# Patient Record
Sex: Male | Born: 1975 | Race: Black or African American | Hispanic: No | Marital: Married | Smoking: Former smoker
Health system: Western US, Academic
[De-identification: ages and names within clinical notes are randomized; demographics above are authoritative.]

## PROBLEM LIST (undated history)

## (undated) DIAGNOSIS — S82892A Other fracture of left lower leg, initial encounter for closed fracture: Secondary | ICD-10-CM

## (undated) DIAGNOSIS — S92901A Unspecified fracture of right foot, initial encounter for closed fracture: Secondary | ICD-10-CM

## (undated) DIAGNOSIS — IMO0001 Reserved for inherently not codable concepts without codable children: Secondary | ICD-10-CM

## (undated) DIAGNOSIS — F10231 Alcohol dependence with withdrawal delirium: Secondary | ICD-10-CM

## (undated) DIAGNOSIS — A0472 Enterocolitis due to Clostridium difficile, not specified as recurrent: Secondary | ICD-10-CM

## (undated) DIAGNOSIS — F1020 Alcohol dependence, uncomplicated: Secondary | ICD-10-CM

## (undated) DIAGNOSIS — F10931 Alcohol use, unspecified with withdrawal delirium (CMS-HCC): Secondary | ICD-10-CM

## (undated) MED ORDER — INFLUENZA VAC SPLIT QUAD 0.5 ML IM SUSY
0.50 mL | PREFILLED_SYRINGE | INTRAMUSCULAR | Status: AC
Start: 2016-07-06 — End: ?

---
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
## 2013-10-13 ENCOUNTER — Emergency Department (HOSPITAL_COMMUNITY)
Admission: EM | Admit: 2013-10-13 | Discharge: 2013-10-13 | Disposition: A | Discharge status: Home / Self Care | Payer: Medicaid Other | Attending: Emergency Medicine | Admitting: Emergency Medicine

## 2013-10-13 ENCOUNTER — Emergency Department (HOSPITAL_COMMUNITY): Payer: Medicaid Other

## 2013-10-13 ENCOUNTER — Encounter (HOSPITAL_COMMUNITY): Payer: Self-pay | Admitting: Emergency Medicine

## 2013-10-13 DIAGNOSIS — F17200 Nicotine dependence, unspecified, uncomplicated: Secondary | ICD-10-CM | POA: Insufficient documentation

## 2013-10-13 DIAGNOSIS — X500XXA Overexertion from strenuous movement or load, initial encounter: Secondary | ICD-10-CM | POA: Insufficient documentation

## 2013-10-13 DIAGNOSIS — Y929 Unspecified place or not applicable: Secondary | ICD-10-CM | POA: Insufficient documentation

## 2013-10-13 DIAGNOSIS — S9306XA Dislocation of unspecified ankle joint, initial encounter: Secondary | ICD-10-CM | POA: Insufficient documentation

## 2013-10-13 DIAGNOSIS — IMO0002 Reserved for concepts with insufficient information to code with codable children: Secondary | ICD-10-CM

## 2013-10-13 DIAGNOSIS — Y9301 Activity, walking, marching and hiking: Secondary | ICD-10-CM | POA: Insufficient documentation

## 2013-10-13 HISTORY — DX: Other fracture of left lower leg, initial encounter for closed fracture: S82.892A

## 2013-10-13 HISTORY — DX: Unspecified fracture of right foot, initial encounter for closed fracture: S92.901A

## 2013-10-13 MED ORDER — NAPROXEN 500 MG PO TABS: 500.0000 mg | ORAL_TABLET | Freq: Two times a day (BID) | ORAL | Status: DC

## 2013-10-13 NOTE — Discharge Instructions (Signed)
Ankle Fracture A fracture is a break in the bone. A cast or splint is used to protect and keep your injured bone from moving.  HOME CARE INSTRUCTIONS   Use your crutches as directed.  To lessen the swelling, keep the injured leg elevated while sitting or lying down.  Apply ice to the injury for 15-20 minutes, 03-04 times per day while awake for 2 days. Put the ice in a plastic bag and place a thin towel between the bag of ice and your cast.  If you have a plaster or fiberglass cast:  Do not try to scratch the skin under the cast using sharp or pointed objects.  Check the skin around the cast every day. You may put lotion on any red or sore areas.  Keep your cast dry and clean.  If you have a plaster splint:  Wear the splint as directed.  You may loosen the elastic around the splint if your toes become numb, tingle, or turn cold or blue.  Do not put pressure on any part of your cast or splint; it may break. Rest your cast only on a pillow the first 24 hours until it is fully hardened.  Your cast or splint can be protected during bathing with a plastic bag. Do not lower the cast or splint into water.  Take medications as directed by your caregiver. Only take over-the-counter or prescription medicines for pain, discomfort, or fever as directed by your caregiver.  Do not drive a vehicle until your caregiver specifically tells you it is safe to do so.  If your caregiver has given you a follow-up appointment, it is very important to keep that appointment. Not keeping the appointment could result in a chronic or permanent injury, pain, and disability. If there is any problem keeping the appointment, you must call back to this facility for assistance. SEEK IMMEDIATE MEDICAL CARE IF:   Your cast gets damaged or breaks.  You have continued severe pain or more swelling than you did before the cast was put on.  Your skin or toenails below the injury turn blue or gray, or feel cold or  numb.  There is a bad smell or new stains and/or purulent (pus like) drainage coming from under the cast. If you do not have a window in your cast for observing the wound, a discharge or minor bleeding may show up as a stain on the outside of your cast. Report these findings to your caregiver. MAKE SURE YOU:   Understand these instructions.  Will watch your condition.  Will get help right away if you are not doing well or get worse. Document Released: 07/20/2000 Document Revised: 10/15/2011 Document Reviewed: 02/19/2013 [facility] Patient Information 2014 [place] [place].  Naproxen and naproxen sodium oral immediate-release tablets What is this medicine? NAPROXEN (na PROX en) is a non-steroidal anti-inflammatory drug (NSAID). It is used to reduce swelling and to treat pain. This medicine may be used for dental pain, headache, or painful monthly periods. It is also used for painful joint and muscular problems such as arthritis, tendinitis, bursitis, and gout. This medicine may be used for other purposes; ask your health care provider or pharmacist if you have questions. COMMON BRAND NAME(S): Aflaxen, Aleve Arthritis, Aleve, All Day Relief, Anaprox DS, Anaprox, Naprosyn What should I tell my health care provider before I take this medicine? They need to know if you have any of these conditions: -asthma -cigarette smoker -drink more than 3 alcohol containing drinks a day -heart  disease or circulation problems such as heart failure or leg edema (fluid retention) -high blood pressure -kidney disease -liver disease -stomach bleeding or ulcers -an unusual or allergic reaction to naproxen, aspirin, other NSAIDs, other medicines, foods, dyes, or preservatives -pregnant or trying to get pregnant -breast-feeding How should I use this medicine? Take this medicine by mouth with a glass of water. Follow the directions on the prescription label. Take it with food if your stomach gets upset. Try to  not lie down for at least 10 minutes after you take it. Take your medicine at regular intervals. Do not take your medicine more often than directed. Long-term, continuous use may increase the risk of heart attack or stroke. A special MedGuide will be given to you by the pharmacist with each prescription and refill. Be sure to read this information carefully each time. Talk to your pediatrician regarding the use of this medicine in children. Special care may be needed. Overdosage: If you think you have taken too much of this medicine contact a poison control center or emergency room at once. NOTE: This medicine is only for you. Do not share this medicine with others. What if I miss a dose? If you miss a dose, take it as soon as you can. If it is almost time for your next dose, take only that dose. Do not take double or extra doses. What may interact with this medicine? -alcohol -aspirin -cidofovir -diuretics -lithium -methotrexate -other drugs for inflammation like ketorolac or prednisone -pemetrexed -probenecid -warfarin This list may not describe all possible interactions. Give your health care provider a list of all the medicines, herbs, non-prescription drugs, or dietary supplements you use. Also tell them if you smoke, drink alcohol, or use illegal drugs. Some items may interact with your medicine. What should I watch for while using this medicine? Tell your doctor or health care professional if your pain does not get better. Talk to your doctor before taking another medicine for pain. Do not treat yourself. This medicine does not prevent heart attack or stroke. In fact, this medicine may increase the chance of a heart attack or stroke. The chance may increase with longer use of this medicine and in people who have heart disease. If you take aspirin to prevent heart attack or stroke, talk with your doctor or health care professional. Do not take other medicines that contain aspirin,  ibuprofen, or naproxen with this medicine. Side effects such as stomach upset, nausea, or ulcers may be more likely to occur. Many medicines available without a prescription should not be taken with this medicine. This medicine can cause ulcers and bleeding in the stomach and intestines at any time during treatment. Do not smoke cigarettes or drink alcohol. These increase irritation to your stomach and can make it more susceptible to damage from this medicine. Ulcers and bleeding can happen without warning symptoms and can cause death. You may get drowsy or dizzy. Do not drive, use machinery, or do anything that needs mental alertness until you know how this medicine affects you. Do not stand or sit up quickly, especially if you are an older patient. This reduces the risk of dizzy or fainting spells. This medicine can cause you to bleed more easily. Try to avoid damage to your teeth and gums when you brush or floss your teeth. What side effects may I notice from receiving this medicine? Side effects that you should report to your doctor or health care professional as soon as possible: -black or  bloody stools, blood in the urine or vomit -blurred vision -chest pain -difficulty breathing or wheezing -nausea or vomiting -severe stomach pain -skin rash, skin redness, blistering or peeling skin, hives, or itching -slurred speech or weakness on one side of the body -swelling of eyelids, throat, lips -unexplained weight gain or swelling -unusually weak or tired -yellowing of eyes or skin Side effects that usually do not require medical attention (report to your doctor or health care professional if they continue or are bothersome): -constipation -headache -heartburn This list may not describe all possible side effects. Call your doctor for medical advice about side effects. You may report side effects to FDA at 1-800-FDA-1088. Where should I keep my medicine? Keep out of the reach of  children. Store at room temperature between 15 and 30 degrees C (59 and 86 degrees F). Keep container tightly closed. Throw away any unused medicine after the expiration date. NOTE: This sheet is a summary. It may not cover all possible information. If you have questions about this medicine, talk to your doctor, pharmacist, or health care provider.  2014, Elsevier/Gold Standard. (2009-07-25 20:10:16)

## 2013-10-13 NOTE — ED Notes (Signed)
Patient states he was walking and felt a "pop in the top of my left foot".   Patient states 8/10 pain since.

## 2013-10-13 NOTE — ED Provider Notes (Signed)
Medical screening examination/treatment/procedure(s) were performed by non-physician practitioner and as supervising physician I was immediately available for consultation/collaboration.  Flint MelterElliott L Jaymarie Yeakel, MD 10/13/13 [phone]

## 2013-10-13 NOTE — ED Provider Notes (Signed)
CSN: [id number]     Arrival date & time 10/13/13  0816 History  This chart was scribed for non-physician practitioner, Arthor CaptainAbigail Miyana Mordecai, PA-C working with Flint MelterElliott L Wentz, MD by Greggory StallionKayla Andersen, ED scribe. This patient was seen in room TR08C/TR08C and the patient's care was started at 9:37 AM.   Chief Complaint  Patient presents with  . Foot Pain   The history is provided by the patient. No language interpreter was used.   HPI Comments: Daniel Santiago is a 38 y.o. male who presents to the Emergency Department complaining of sudden onset left foot pain with associated swelling that started one week ago. Pt states he was walking, got a cramp in his foot and felt a pop on the top of it. Bearing weight worsens the pain. Denies numbness or tingling in toes. Pt smokes cigarettes daily.   Past Medical History  Diagnosis Date  . Foot fracture, right   . Ankle fracture, left    History reviewed. No pertinent past surgical history. No family history on file. History  Substance Use Topics  . Smoking status: Current Every Day Smoker  . Smokeless tobacco: Not on file  . Alcohol Use: Yes    Review of Systems  Constitutional: Negative for fever.  HENT: Negative for congestion.   Eyes: Negative for redness.  Respiratory: Negative for shortness of breath.   Cardiovascular: Negative for chest pain.  Gastrointestinal: Negative for abdominal distention.  Musculoskeletal: Positive for arthralgias and joint swelling.  Skin: Negative for rash.  Neurological: Negative for speech difficulty.  Psychiatric/Behavioral: Negative for confusion.   Allergies  Review of patient's allergies indicates no known allergies.  Home Medications  No current outpatient prescriptions on file.  BP 134/84  Pulse 88  Temp(Src) 98.2 F (36.8 C) (Oral)  Resp 18  SpO2 100%  Physical Exam  Nursing note and vitals reviewed. Constitutional: He is oriented to person, place, and time. He appears well-developed and  well-nourished. No distress.  HENT:  Head: Normocephalic and atraumatic.  Eyes: EOM are normal.  Neck: Neck supple. No tracheal deviation present.  Cardiovascular: Normal rate.   Pulmonary/Chest: Effort normal. No respiratory distress.  Musculoskeletal: Normal range of motion.  Swelling and discoloration over the dorsal surface of the metatarsal region. Tender to palpation. Pulses intact.   Neurological: He is alert and oriented to person, place, and time.  Skin: Skin is warm and dry.  Psychiatric: He has a normal mood and affect. His behavior is normal.    ED Course  Procedures (including critical care time)  DIAGNOSTIC STUDIES: Oxygen Saturation is 100% on RA, normal by my interpretation.    COORDINATION OF CARE: 9:42 AM-Discussed treatment plan which includes ankle splint, crutches and pain medication with pt at bedside and pt agreed to plan. Advised pt to follow up with an orthopedist or podiatrist.   Labs Review Labs Reviewed - No data to display Imaging Review Dg Foot Complete Left  10/13/2013   CLINICAL DATA:  Proximal metatarsal pain without history of injury  EXAM: LEFT FOOT - COMPLETE 3+ VIEW  COMPARISON:  None.  FINDINGS: Three views of the left foot reveal the bones to be adequately mineralized. There is no evidence of an acute fracture nor dislocation. The interphalangeal joints, the metatarsophalangeal joints, and the tarsometatarsal joints appear normal. The bones of the hindfoot appear intact. The overlying soft tissues are normal in appearance.  IMPRESSION: There is no acute bony abnormality of the left foot nor evidence of significant degenerative change. An  accessory ossicle or old avulsion from the tip of the medial malleolus is demonstrated.   Electronically Signed   By: David  [place]   On: 10/13/2013 09:07     EKG Interpretation None      MDM   Final diagnoses:  Avulsion of right ankle    Patient came in with pain in the medial malleolus after severe  cramps and feeling a pop.  I do feel that although the x-ray is read as no acute injury this did happen in about a week and half ago and I feel that he does have an avulsion fracture of the  medial malleolus secondary to muscle strain.  Integument Cam Walker boot, light duty at work, crutches, followup with orthopedics or podiatry.  I personally performed the services described in this documentation, which was scribed in my presence. The recorded information has been reviewed and is accurate.   Arthor Captain, PA-C 10/13/13 1034

## 2014-02-25 ENCOUNTER — Ambulatory Visit: Payer: Self-pay | Admitting: Podiatry

## 2014-05-17 ENCOUNTER — Encounter (HOSPITAL_COMMUNITY): Payer: Self-pay | Admitting: Emergency Medicine

## 2014-05-17 ENCOUNTER — Emergency Department (HOSPITAL_COMMUNITY)
Admission: EM | Admit: 2014-05-17 | Discharge: 2014-05-17 | Disposition: A | Discharge status: Home / Self Care | Payer: Medicaid Other | Attending: Emergency Medicine | Admitting: Emergency Medicine

## 2014-05-17 DIAGNOSIS — Z791 Long term (current) use of non-steroidal anti-inflammatories (NSAID): Secondary | ICD-10-CM | POA: Diagnosis not present

## 2014-05-17 DIAGNOSIS — M79662 Pain in left lower leg: Secondary | ICD-10-CM | POA: Insufficient documentation

## 2014-05-17 DIAGNOSIS — Z720 Tobacco use: Secondary | ICD-10-CM | POA: Insufficient documentation

## 2014-05-17 DIAGNOSIS — M79661 Pain in right lower leg: Secondary | ICD-10-CM | POA: Diagnosis present

## 2014-05-17 DIAGNOSIS — Z8781 Personal history of (healed) traumatic fracture: Secondary | ICD-10-CM | POA: Diagnosis not present

## 2014-05-17 DIAGNOSIS — M79605 Pain in left leg: Secondary | ICD-10-CM

## 2014-05-17 DIAGNOSIS — M79604 Pain in right leg: Secondary | ICD-10-CM

## 2014-05-17 LAB — BASIC METABOLIC PANEL
Anion gap: 12 (ref 5–15)
BUN: 10 mg/dL (ref 6–23)
CHLORIDE: 103 meq/L (ref 96–112)
CO2: 28 mEq/L (ref 19–32)
Calcium: 9.5 mg/dL (ref 8.4–10.5)
Creatinine, Ser: 0.91 mg/dL (ref 0.50–1.35)
GFR calc Af Amer: >90 mL/min (ref >90)
GFR calc non Af Amer: >90 mL/min (ref >90)
Glucose, Bld: 121 mg/dL — ABNORMAL HIGH (ref 70–99)
POTASSIUM: 4.1 meq/L (ref 3.7–5.3)
SODIUM: 143 meq/L (ref 137–147)

## 2014-05-17 MED ORDER — OXYCODONE-ACETAMINOPHEN 5-325 MG PO TABS: 1.0000 | ORAL_TABLET | ORAL | Status: AC | PRN

## 2014-05-17 MED ORDER — OXYCODONE-ACETAMINOPHEN 5-325 MG PO TABS
1.0000 | ORAL_TABLET | Freq: Once | ORAL | Status: AC
  Administered 2014-05-17: 1 via ORAL
  Filled 2014-05-17: qty 1

## 2014-05-17 NOTE — ED Provider Notes (Signed)
Medical screening examination/treatment/procedure(s) were performed by non-physician practitioner and as supervising physician I was immediately available for consultation/collaboration.   EKG Interpretation None        Dymon Summerhill, MD 05/17/14 1958 

## 2014-05-17 NOTE — ED Notes (Signed)
Pt c/o bilateral foot and leg pain; pt sts x 2 years with calluses to bottom of feet from walking; pt sts some swelling none noted

## 2014-05-17 NOTE — Discharge Instructions (Signed)
°Emergency Department Resource Guide °1) Find a Doctor and Pay Out of Pocket °Although you won't have to find out who is covered by your insurance plan, it is a good idea to ask around and get recommendations. You will then need to call the office and see if the doctor you have chosen will accept you as a new patient and what types of options they offer for patients who are self-pay. Some doctors offer discounts or will set up payment plans for their patients who do not have insurance, but you will need to ask so you aren't surprised when you get to your appointment. ° °2) Contact Your Local Health Department °Not all health departments have doctors that can see patients for sick visits, but many do, so it is worth a call to see if yours does. If you don't know where your local health department is, you can check in your phone book. The CDC also has a tool to help you locate your state's health department, and many state websites also have listings of all of their local health departments. ° °3) Find a Walk-in Clinic °If your illness is not likely to be very severe or complicated, you may want to try a walk in clinic. These are popping up all over the country in pharmacies, drugstores, and shopping centers. They're usually staffed by nurse practitioners or physician assistants that have been trained to treat common illnesses and complaints. They're usually fairly quick and inexpensive. However, if you have serious medical issues or chronic medical problems, these are probably not your best option. ° °No Primary Care Doctor: °- Call [facility] at  832-8000 - they can help you locate a primary care doctor that  accepts your insurance, provides certain services, etc. °- Physician Referral Service- 1-800-533-3463 ° °Chronic Pain Problems: °Organization         Address  Phone   Notes  °[facility]  [phone] Patients need to be referred by their primary care doctor.  ° °Medication  Assistance: °Organization         Address  Phone   Notes  °[place] [facility] [address] °[place], [place] [address] [phone]) [phone] --Must be a resident of [place] °-- Must have NO insurance coverage whatsoever (no Medicaid/ Medicare, etc.) °-- The pt. MUST have a primary care doctor that directs their care regularly and follows them in the community °  °[facility]  (866) 331-1348   °[employer]  (888) 892-1162   ° °Agencies that provide inexpensive medical care: °Organization         Address  Phone   Notes  °[facility]  ([phone]) [phone]   °[facility]    ([phone]) [phone]   °[facility] [address] °[place], [place] [address] [phone]) [phone]   °[facility] [address], °[place] ([phone]) [phone]   °[facility]    ([phone]) [phone]   °[facility]    ([phone]) [phone]   °[facility] ° [address], [place] Phone:  (336) [phone], Fax:  ([phone] Hours of Operation:  9 am - 6 pm, M-F.  Also accepts Medicaid/Medicare and self-pay.  °[facility] ° [address], [place] Phone: ([phone]) [phone], Fax: ([phone]) [phone]. Hours of Operation:  8:30 am - 5:30 pm, M-F.  Also accepts Medicaid and self-pay.  °[facility] [place] [address], [place] Phone: ([phone]) [phone]   °[facility] [address], [place], [place] (336)[phone], Ext. 123 Mondays & Thursdays: 7-9 AM.  First 15 patients are seen on a first come, first serve basis. °  ° °Medicaid-accepting [place] Providers: ° °Organization         Address  Phone   Notes  °[facility] [address], [place] ([phone] Also accepts self-pay patients.  °[facility] [address], [place] ° ([phone]) [phone]   °[facility] [address], [place]  ([phone]) [phone]   °[facility] [address] [place] [address], [place] ([phone]) [phone]   °[facility] [address], [place]  ° ([phone]) [phone] Only accepts [place] Access Medicaid patients after they have their name applied to their card.  ° °Self-Pay (no insurance) in [place]: ° °Organization         Address  Phone   Notes  °Sickle Cell Patients, [facility] [address], [place] ([phone]) [phone]   °[facility] [address], [place] ([phone]) [phone]   °[facility] ° [address], [place] ([phone]) [phone]   °[facility]/Dr. Osei-Bonsu ° [address] [place] [address], [place] or [address], [place] [phone] Phone number for both [place] and [place] locations is the same.  °[facility] [address], [place] ([phone]   °[facility] [address] [place] [address], [place] or [address] ([phone]) [phone] °([phone]) [phone]   °[facility] [address], [place] ([phone]) [phone], phone; ([phone]) [phone], fax Sees patients 1st and 3rd Saturday of every month.  Must not qualify for public or private insurance (i.e. Medicaid, Medicare, [place] Health Choice, Veterans' Benefits) • Household income should be no more than 200% of the poverty level •The clinic cannot treat you if you are pregnant or think you are pregnant • Sexually transmitted diseases are not treated at the clinic.  ° °

## 2014-05-17 NOTE — ED Provider Notes (Signed)
CSN: [id number]     Arrival date & time 05/17/14  [id number] History   First MD Initiated Contact with Patient 05/17/14 0935     Chief Complaint  Patient presents with  . Foot Pain  . Leg Pain     (Consider location/radiation/quality/duration/timing/severity/associated sxs/prior Treatment) Patient is a 38 y.o. male presenting with lower extremity pain and leg pain. The history is provided by the patient. No language interpreter was used.  Foot Pain This is a chronic problem. The current episode started more than 1 month ago. Pertinent negatives include no chest pain, chills, fever, nausea, numbness or vomiting. Associated symptoms comments: Bilateral foot and lower leg pain/discomfort for the past several months. He reports frequent muscle cramping in calves and hands - no cramping today. No fever, color change or wound/blister/lesion. He states there is intermittent leg swelling. The discomfort today is worsening of chronic pain and, other than the intensity, does not include new symptoms. .  Leg Pain Associated symptoms: no fever     Past Medical History  Diagnosis Date  . Foot fracture, right   . Ankle fracture, left    History reviewed. No pertinent past surgical history. History reviewed. No pertinent family history. History  Substance Use Topics  . Smoking status: Current Every Day Smoker  . Smokeless tobacco: Not on file  . Alcohol Use: Yes    Review of Systems  Constitutional: Negative for fever and chills.  Respiratory: Negative.  Negative for shortness of breath.   Cardiovascular: Negative.  Negative for chest pain.  Gastrointestinal: Negative.  Negative for nausea and vomiting.  Musculoskeletal:       See HPI  Skin: Negative.  Negative for color change and wound.  Neurological: Negative.  Negative for numbness.      Allergies  Review of patient's allergies indicates no known allergies.  Home Medications   Prior to Admission medications   Medication Sig Start  Date End Date Taking? Authorizing Provider  naproxen (NAPROSYN) 500 MG tablet Take 1 tablet (500 mg total) by mouth 2 (two) times daily with a meal. 10/13/13   Abigail Harris, PA-C   BP 136/92  Pulse 62  Temp(Src) 98.2 F (36.8 C) (Oral)  Resp 20  SpO2 97% Physical Exam  Constitutional: He is oriented to person, place, and time. He appears well-developed and well-nourished.  Neck: Normal range of motion.  Pulmonary/Chest: Effort normal.  Musculoskeletal: Normal range of motion.  Lower extremities unremarkable in appearance. No redness or swelling. Feet are generally tender. Calluses present bilateral plantar surfaces. FROM. No calf tenderness. Achilles intact but bilaterally tender.   Neurological: He is alert and oriented to person, place, and time.  Skin: Skin is warm and dry.  Psychiatric: He has a normal mood and affect.    ED Course  Procedures (including critical care time) Labs Review Labs Reviewed  BASIC METABOLIC PANEL   Results for orders placed during the hospital encounter of 05/17/14  BASIC METABOLIC PANEL      Result Value Ref Range   Sodium 143  137 - 147 mEq/L   Potassium 4.1  3.7 - 5.3 mEq/L   Chloride 103  96 - 112 mEq/L   CO2 28  19 - 32 mEq/L   Glucose, Bld 121 (*) 70 - 99 mg/dL   BUN 10  6 - 23 mg/dL   Creatinine, Ser 1.190.91  0.50 - 1.35 mg/dL   Calcium 9.5  8.4 - 14.710.5 mg/dL   GFR calc non Af Amer >90  >  90 mL/min   GFR calc Af Amer >90  >90 mL/min   Anion gap 12  5 - 15    Imaging Review No results found.   EKG Interpretation None      MDM   Final diagnoses:  None    1. Lower extremity pain  No evidence infection, swelling, other acute process. Doubt DVT or vascular compromise given no calf tenderness, swelling or redness, good distal pulses. Will treat chronic pain and refer to PCP.  Arnoldo HookerShari A Jerek Meulemans, PA-C 05/17/14 1448

## 2014-06-01 ENCOUNTER — Ambulatory Visit: Payer: Medicaid Other | Attending: Internal Medicine | Admitting: Internal Medicine

## 2014-06-01 ENCOUNTER — Encounter: Payer: Self-pay | Admitting: Internal Medicine

## 2014-06-01 VITALS — BP 130/90 | HR 76 | Temp 98.0°F | Resp 16 | Wt 168.4 lb

## 2014-06-01 DIAGNOSIS — L84 Corns and callosities: Secondary | ICD-10-CM | POA: Insufficient documentation

## 2014-06-01 DIAGNOSIS — Z0000 Encounter for general adult medical examination without abnormal findings: Secondary | ICD-10-CM | POA: Insufficient documentation

## 2014-06-01 DIAGNOSIS — M79671 Pain in right foot: Secondary | ICD-10-CM | POA: Insufficient documentation

## 2014-06-01 DIAGNOSIS — F17200 Nicotine dependence, unspecified, uncomplicated: Secondary | ICD-10-CM | POA: Insufficient documentation

## 2014-06-01 DIAGNOSIS — R030 Elevated blood-pressure reading, without diagnosis of hypertension: Secondary | ICD-10-CM | POA: Insufficient documentation

## 2014-06-01 DIAGNOSIS — M79606 Pain in leg, unspecified: Secondary | ICD-10-CM | POA: Diagnosis not present

## 2014-06-01 DIAGNOSIS — Z720 Tobacco use: Secondary | ICD-10-CM | POA: Diagnosis not present

## 2014-06-01 DIAGNOSIS — Z23 Encounter for immunization: Secondary | ICD-10-CM | POA: Insufficient documentation

## 2014-06-01 DIAGNOSIS — M79672 Pain in left foot: Secondary | ICD-10-CM

## 2014-06-01 DIAGNOSIS — IMO0001 Reserved for inherently not codable concepts without codable children: Secondary | ICD-10-CM | POA: Insufficient documentation

## 2014-06-01 DIAGNOSIS — M79609 Pain in unspecified limb: Secondary | ICD-10-CM | POA: Insufficient documentation

## 2014-06-01 LAB — CBC WITH DIFFERENTIAL/PLATELET
Basophils Absolute: 0 10*3/uL (ref 0.0–0.1)
Basophils Relative: 0 % (ref 0–1)
EOS ABS: 0 10*3/uL (ref 0.0–0.7)
Eosinophils Relative: 0 % (ref 0–5)
HEMATOCRIT: 46.7 % (ref 39.0–52.0)
HEMOGLOBIN: 16.5 g/dL (ref 13.0–17.0)
LYMPHS PCT: 10 % — AB (ref 12–46)
Lymphs Abs: 1.1 10*3/uL (ref 0.7–4.0)
MCH: 34 pg (ref 26.0–34.0)
MCHC: 35.3 g/dL (ref 30.0–36.0)
MCV: 96.1 fL (ref 78.0–100.0)
Monocytes Absolute: 0.6 10*3/uL (ref 0.1–1.0)
Monocytes Relative: 5 % (ref 3–12)
NEUTROS ABS: 9.5 10*3/uL — AB (ref 1.7–7.7)
Neutrophils Relative %: 85 % — ABNORMAL HIGH (ref 43–77)
Platelets: 369 10*3/uL (ref 150–400)
RBC: 4.86 MIL/uL (ref 4.22–5.81)
RDW: 13.6 % (ref 11.5–15.5)
WBC: 11.2 10*3/uL — ABNORMAL HIGH (ref 4.0–10.5)

## 2014-06-01 LAB — COMPLETE METABOLIC PANEL WITH GFR
ALBUMIN: 4.3 g/dL (ref 3.5–5.2)
ALT: 18 U/L (ref 0–53)
AST: 22 U/L (ref 0–37)
Alkaline Phosphatase: 79 U/L (ref 39–117)
BUN: 9 mg/dL (ref 6–23)
CALCIUM: 10 mg/dL (ref 8.4–10.5)
CO2: 28 mEq/L (ref 19–32)
Chloride: 100 mEq/L (ref 96–112)
Creat: 0.83 mg/dL (ref 0.50–1.35)
GFR, Est African American: >89 mL/min
GLUCOSE: 95 mg/dL (ref 70–99)
Potassium: 4.8 mEq/L (ref 3.5–5.3)
SODIUM: 139 meq/L (ref 135–145)
Total Bilirubin: 0.9 mg/dL (ref 0.2–1.2)
Total Protein: 7.3 g/dL (ref 6.0–8.3)

## 2014-06-01 LAB — LIPID PANEL
CHOLESTEROL: 196 mg/dL (ref 0–200)
HDL: 125 mg/dL (ref >39)
LDL Cholesterol: 60 mg/dL (ref 0–99)
Total CHOL/HDL Ratio: 1.6 Ratio
Triglycerides: 56 mg/dL (ref <150)
VLDL: 11 mg/dL (ref 0–40)

## 2014-06-01 LAB — POCT GLYCOSYLATED HEMOGLOBIN (HGB A1C): HEMOGLOBIN A1C: 5.3

## 2014-06-01 LAB — GLUCOSE, POCT (MANUAL RESULT ENTRY): POC Glucose: 106 mg/dl — AB (ref 70–99)

## 2014-06-01 LAB — TSH: TSH: 0.507 u[IU]/mL (ref 0.350–4.500)

## 2014-06-01 MED ORDER — GABAPENTIN 300 MG PO CAPS: 300.0000 mg | ORAL_CAPSULE | Freq: Three times a day (TID) | ORAL | Status: AC

## 2014-06-01 MED ORDER — NAPROXEN 500 MG PO TABS: 500.0000 mg | ORAL_TABLET | Freq: Two times a day (BID) | ORAL | Status: AC

## 2014-06-01 NOTE — Progress Notes (Signed)
Patient here to establish care Was recently seen in the ED for callus' on his feet as  Well as poor circulation to his lower legs Patient states he has been having a tingling feeling in his toes on his left foot Patient states diabetes does run in his family

## 2014-06-01 NOTE — Patient Instructions (Signed)
DASH Eating Plan °DASH stands for "Dietary Approaches to Stop Hypertension." The DASH eating plan is a healthy eating plan that has been shown to reduce high blood pressure (hypertension). Additional health benefits may include reducing the risk of type 2 diabetes mellitus, heart disease, and stroke. The DASH eating plan may also help with weight loss. °WHAT DO I NEED TO KNOW ABOUT THE DASH EATING PLAN? °For the DASH eating plan, you will follow these general guidelines: °· Choose foods with a percent daily value for sodium of less than 5% (as listed on the food label). °· Use salt-free seasonings or herbs instead of table salt or sea salt. °· Check with your health care provider or pharmacist before using salt substitutes. °· Eat lower-sodium products, often labeled as "lower sodium" or "no salt added." °· Eat fresh foods. °· Eat more vegetables, fruits, and low-fat dairy products. °· Choose whole grains. Look for the word "whole" as the first word in the ingredient list. °· Choose fish and skinless chicken or turkey more often than red meat. Limit fish, poultry, and meat to 6 oz (170 g) each day. °· Limit sweets, desserts, sugars, and sugary drinks. °· Choose heart-healthy fats. °· Limit cheese to 1 oz (28 g) per day. °· Eat more home-cooked food and less restaurant, buffet, and fast food. °· Limit fried foods. °· Cook foods using methods other than frying. °· Limit canned vegetables. If you do use them, rinse them well to decrease the sodium. °· When eating at a restaurant, ask that your food be prepared with less salt, or no salt if possible. °WHAT FOODS CAN I EAT? °Seek help from a dietitian for individual calorie needs. °Grains °Whole grain or whole wheat bread. Brown rice. Whole grain or whole wheat pasta. Quinoa, bulgur, and whole grain cereals. Low-sodium cereals. Corn or whole wheat flour tortillas. Whole grain cornbread. Whole grain crackers. Low-sodium crackers. °Vegetables °Fresh or frozen vegetables  (raw, steamed, roasted, or grilled). Low-sodium or reduced-sodium tomato and vegetable juices. Low-sodium or reduced-sodium tomato sauce and paste. Low-sodium or reduced-sodium canned vegetables.  °Fruits °All fresh, canned (in natural juice), or frozen fruits. °Meat and Other Protein Products °Ground beef (85% or leaner), grass-fed beef, or beef trimmed of fat. Skinless chicken or turkey. Ground chicken or turkey. Pork trimmed of fat. All fish and seafood. Eggs. Dried beans, peas, or lentils. Unsalted nuts and seeds. Unsalted canned beans. °Dairy °Low-fat dairy products, such as skim or 1% milk, 2% or reduced-fat cheeses, low-fat ricotta or cottage cheese, or plain low-fat yogurt. Low-sodium or reduced-sodium cheeses. °Fats and Oils °Tub margarines without trans fats. Light or reduced-fat mayonnaise and salad dressings (reduced sodium). Avocado. Safflower, olive, or canola oils. Natural peanut or almond butter. °Other °Unsalted popcorn and pretzels. °The items listed above may not be a complete list of recommended foods or beverages. Contact your dietitian for more options. °WHAT FOODS ARE NOT RECOMMENDED? °Grains °White bread. White pasta. White rice. Refined cornbread. Bagels and croissants. Crackers that contain trans fat. °Vegetables °Creamed or fried vegetables. Vegetables in a cheese sauce. Regular canned vegetables. Regular canned tomato sauce and paste. Regular tomato and vegetable juices. °Fruits °Dried fruits. Canned fruit in light or heavy syrup. Fruit juice. °Meat and Other Protein Products °Fatty cuts of meat. Ribs, chicken wings, bacon, sausage, bologna, salami, chitterlings, fatback, hot dogs, bratwurst, and packaged luncheon meats. Salted nuts and seeds. Canned beans with salt. °Dairy °Whole or 2% milk, cream, half-and-half, and cream cheese. Whole-fat or sweetened yogurt. Full-fat   cheeses or blue cheese. Nondairy creamers and whipped toppings. Processed cheese, cheese spreads, or cheese  curds. °Condiments °Onion and garlic salt, seasoned salt, table salt, and sea salt. Canned and packaged gravies. Worcestershire sauce. Tartar sauce. Barbecue sauce. Teriyaki sauce. Soy sauce, including reduced sodium. Steak sauce. Fish sauce. Oyster sauce. Cocktail sauce. Horseradish. Ketchup and mustard. Meat flavorings and tenderizers. Bouillon cubes. Hot sauce. Tabasco sauce. Marinades. Taco seasonings. Relishes. °Fats and Oils °Butter, stick margarine, lard, shortening, ghee, and bacon fat. Coconut, palm kernel, or palm oils. Regular salad dressings. °Other °Pickles and olives. Salted popcorn and pretzels. °The items listed above may not be a complete list of foods and beverages to avoid. Contact your dietitian for more information. °WHERE CAN I FIND MORE INFORMATION? °National Heart, [employer], and [employer] Institute: www.nhlbi.nih.gov/health/health-topics/topics/dash/ °Document Released: 07/12/2011 Document Revised: 12/07/2013 Document Reviewed: 05/27/2013 °ExitCare® Patient Information ©2015 [employer]. This information is not intended to replace advice given to you by your health care provider. Make sure you discuss any questions you have with your health care provider. ° °

## 2014-06-01 NOTE — Progress Notes (Signed)
Patient Demographics  Daniel Santiago, is a 38 y.o. male  [id number]  [id number]  DOB - 12-15-1975  CC:  Chief Complaint  Patient presents with  . Establish Care       HPI: Daniel QuarryBrandon Santiago is a 38 y.o. male here today to establish medical care. Patient recently went to the emergency room with symptoms of bilateral feet pain, EMR reviewed her patient has foot callus, he was prescribed naproxen and Percocet which as per patient he took the medication but still feels numbness tingling, patient is worried if he has diabetes and poor circulation, he reports family history of diabetes, recently he has felt his feet being cold and bluish, does complain of some claudication as well patient does smoke cigarettes, I have advised patient to quit smoking today his blood pressure is borderline elevated. Patient has No headache, No chest pain, No abdominal pain - No Nausea, No new weakness tingling or numbness, No Cough - SOB.  No Known Allergies Past Medical History  Diagnosis Date  . Foot fracture, right   . Ankle fracture, left    Current Outpatient Prescriptions on File Prior to Visit  Medication Sig Dispense Refill  . oxyCODONE-acetaminophen (PERCOCET/ROXICET) 5-325 MG per tablet Take 1 tablet by mouth every 4 (four) hours as needed for severe pain.  8 tablet  0   No current facility-administered medications on file prior to visit.   Family History  Problem Relation Age of Onset  . Diabetes Mother   . Hypertension Mother   . Hyperlipidemia Mother   . Hypertension Father   . Diabetes Sister   . Stroke Maternal Aunt   . Diabetes Maternal Grandfather    History   Social History  . Marital Status: Single    Spouse Name: N/A    Number of Children: N/A  . Years of Education: N/A   Occupational History  . Not on file.   Social History Main Topics  . Smoking status: Current Every Day Smoker -- 15 years  . Smokeless tobacco: Not on file  . Alcohol Use: Yes  . Drug Use:  No  . Sexual Activity: Not on file   Other Topics Concern  . Not on file   Social History Narrative  . No narrative on file    Review of Systems: Constitutional: Negative for fever, chills, diaphoresis, activity change, appetite change and fatigue. HENT: Negative for ear pain, nosebleeds, congestion, facial swelling, rhinorrhea, neck pain, neck stiffness and ear discharge.  Eyes: Negative for pain, discharge, redness, itching and visual disturbance. Respiratory: Negative for cough, choking, chest tightness, shortness of breath, wheezing and stridor.  Cardiovascular: Negative for chest pain, palpitations and leg swelling. Gastrointestinal: Negative for abdominal distention. Genitourinary: Negative for dysuria, urgency, frequency, hematuria, flank pain, decreased urine volume, difficulty urinating and dyspareunia.  Musculoskeletal: Negative for back pain, joint swelling, arthralgia and gait problem. Neurological: Negative for dizziness, tremors, seizures, syncope, facial asymmetry, speech difficulty, weakness, light-headedness, numbness and headaches.  Hematological: Negative for adenopathy. Does not bruise/bleed easily. Psychiatric/Behavioral: Negative for hallucinations, behavioral problems, confusion, dysphoric mood, decreased concentration and agitation.    Objective:   Filed Vitals:   06/01/14 1201  BP: 130/90  Pulse:   Temp:   Resp:     Physical Exam: Constitutional: Patient appears well-developed and well-nourished. No distress. HENT: Normocephalic, atraumatic, External right and left ear normal. Oropharynx is clear and moist.  Eyes: Conjunctivae and EOM are normal. PERRLA, no scleral icterus. Neck: Normal ROM. Neck supple. No  JVD. No tracheal deviation. No thyromegaly. CVS: RRR, S1/S2 +, no murmurs, no gallops, no carotid bruit.  Pulmonary: Effort and breath sounds normal, no stridor, rhonchi, wheezes, rales.  Abdominal: Soft. BS +, no distension, tenderness, rebound or  guarding.  Musculoskeletal: bilateral feet callus,1+ pedal pulse, cold toes. Neuro: Alert. Normal reflexes, muscle tone coordination. No cranial nerve deficit. Skin: Skin is warm and dry. No rash noted. Not diaphoretic. No erythema. No pallor. Psychiatric: Normal mood and affect. Behavior, judgment, thought content normal.  No results found for this basename: WBC, HGB, HCT, MCV, PLT   Lab Results  Component Value Date   CREATININE 0.91 05/17/2014   BUN 10 05/17/2014   NA 143 05/17/2014   K 4.1 05/17/2014   CL 103 05/17/2014   CO2 28 05/17/2014    Lab Results  Component Value Date   HGBA1C 5.3 06/01/2014   Lipid Panel  No results found for this basename: chol, trig, hdl, cholhdl, vldl, ldlcalc       Assessment and plan:   1. Preventative health care Results for orders placed in visit on 06/01/14  GLUCOSE, POCT (MANUAL RESULT ENTRY)      Result Value Ref Range   POC Glucose 106 (*) 70 - 99 mg/dl  POCT GLYCOSYLATED HEMOGLOBIN (HGB A1C)      Result Value Ref Range   Hemoglobin A1C 5.3     Patient does not have diabetes his A1c 5.3%.  3. Smoking Advised to quit smoking  4. Elevated BP Advised for DASH diet   - Lipid panel - Vit D  25 hydroxy (rtn osteoporosis monitoring) - TSH - CBC with Differential - COMPLETE METABOLIC PANEL WITH GFR  5. Pain in both feet  - Ambulatory referral to Podiatry - gabapentin (NEURONTIN) 300 MG capsule; Take 1 capsule (300 mg total) by mouth 3 (three) times daily.  Dispense: 90 capsule; Refill: 3 - naproxen (NAPROSYN) 500 MG tablet; Take 1 tablet (500 mg total) by mouth 2 (two) times daily with a meal.  Dispense: 30 tablet; Refill: 0  6. Foot callus  - Ambulatory referral to Podiatry  7. Pain of lower extremity, unspecified laterality  - Lower Extremity Arterial Duplex Bilateral; Future     Health Maintenance Flu shot given today   Return in about 3 months (around 09/01/2014) for elevated BP.   Doris CheadleADVANI, Dardan Shelton, MD

## 2014-06-02 ENCOUNTER — Telehealth: Payer: Self-pay | Admitting: *Deleted

## 2014-06-02 ENCOUNTER — Encounter: Payer: Self-pay | Admitting: *Deleted

## 2014-06-02 ENCOUNTER — Encounter: Payer: Self-pay | Admitting: Podiatry

## 2014-06-02 ENCOUNTER — Ambulatory Visit (INDEPENDENT_AMBULATORY_CARE_PROVIDER_SITE_OTHER): Payer: Medicaid Other | Admitting: Podiatry

## 2014-06-02 ENCOUNTER — Ambulatory Visit (INDEPENDENT_AMBULATORY_CARE_PROVIDER_SITE_OTHER): Payer: Medicaid Other

## 2014-06-02 VITALS — BP 129/91 | HR 97 | Resp 16 | Ht 74.0 in | Wt 168.0 lb

## 2014-06-02 DIAGNOSIS — I739 Peripheral vascular disease, unspecified: Secondary | ICD-10-CM

## 2014-06-02 DIAGNOSIS — L84 Corns and callosities: Secondary | ICD-10-CM

## 2014-06-02 DIAGNOSIS — M779 Enthesopathy, unspecified: Secondary | ICD-10-CM

## 2014-06-02 LAB — VITAMIN D 25 HYDROXY (VIT D DEFICIENCY, FRACTURES): Vit D, 25-Hydroxy: 18 ng/mL — ABNORMAL LOW (ref 30–89)

## 2014-06-02 MED ORDER — HYDROCODONE-ACETAMINOPHEN 10-325 MG PO TABS
1.0000 | ORAL_TABLET | Freq: Three times a day (TID) | ORAL | Status: AC | PRN
Start: 2014-06-02 — End: ?

## 2014-06-02 NOTE — Telephone Encounter (Signed)
I called and informed the patient that Dr. Orpah CobbAdvani had already put in an order for a Vascular study.  So, we are not going to put in an order.  I asked if he has any information in regards to that.  He stated, "No, if I don't hear from them today, I'm going to give them a call."

## 2014-06-02 NOTE — Progress Notes (Signed)
Subjective:     Patient ID: Daniel Santiago, male   DOB: 1976/03/01, 38 y.o.   MRN: [id number]  HPI patient states that he has had pain in his feet and legs for the last couple years and it has worsened recently and he is getting a lot of cramps in his lower legs feet and is having trouble being able to bear weight. States he went to his family physician yesterday who referred him over and he also thinks he supposed to have his circulation checked   Review of Systems  All other systems reviewed and are negative.      Objective:   Physical Exam  Nursing note and vitals reviewed. Constitutional: He is oriented to person, place, and time.  Musculoskeletal: Normal range of motion.  Neurological: He is oriented to person, place, and time.  Skin: Skin is warm and dry.   vascular status is found to be significantly diminished with no palpable PT or DP pulses and Corliss to the forefoot of both feet. No active ulcerations were noted and there is keratotic lesion sub-175 both feet that are painful and structural deformity of both feet. Patient is found to be well oriented 3     Assessment:     Very good chance that were dealing with some form of circulatory disease along with structural malalignment    Plan:     H&P and x-rays reviewed and spent a great of time going over this with him and try to make him an appointment for vascular studies and found that they have already been made by his family physician yesterday. Explained that he has the test ordered and that he should be having this done in the next couple days and today I debrided his lesions and went ahead and placed him on Vicodin 05/08/2024 to try to help with this pain until the test can be evaluated and hopeful treatment commenced

## 2014-06-02 NOTE — Progress Notes (Signed)
   Subjective:    Patient ID: Daniel Santiago, male    DOB: 04/06/1976, 38 y.o.   MRN: [id number]  HPI Comments: "I have really bad feet"  Patient c/o extreme aching, throbbing plantar feet bilateral for about 2 years. He has multiple, thick callused areas. He walks to work everyday and is on his feet while at work. He has been out for few weeks now. His feet turn purple. Toes are sensitive he can't cut his toenails. He's tried lotions and trimming.     Review of Systems  Constitutional: Positive for unexpected weight change.  Musculoskeletal: Positive for gait problem and myalgias.  Skin: Positive for color change.  Neurological: Positive for numbness.  All other systems reviewed and are negative.      Objective:   Physical Exam        Assessment & Plan:

## 2014-06-03 ENCOUNTER — Telehealth: Payer: Self-pay | Admitting: *Deleted

## 2014-06-03 ENCOUNTER — Encounter (HOSPITAL_COMMUNITY): Payer: Medicaid Other

## 2014-06-03 NOTE — Telephone Encounter (Signed)
Message copied by Farrell OursEVANS, Orian Amberg K on Thu Jun 03, 2014  9:32 AM ------      Message from: Doris CheadleADVANI, DEEPAK      Created: Wed Jun 02, 2014 10:44 AM       Blood work reviewed, noticed low vitamin D, call patient advise to start ergocalciferol 50,000 units once a week for the duration of  12 weeks.       ------

## 2014-06-03 NOTE — Telephone Encounter (Signed)
Spoke with patient and informed him of below 

## 2015-06-19 IMAGING — CR DG FOOT COMPLETE 3+V*L*
3 series · 3 of 3 positions shown · non-contrast
Comparison: None.

CLINICAL DATA: Proximal metatarsal pain without history of injury

EXAM:
LEFT FOOT - COMPLETE 3+ VIEW

[t foot ap left]
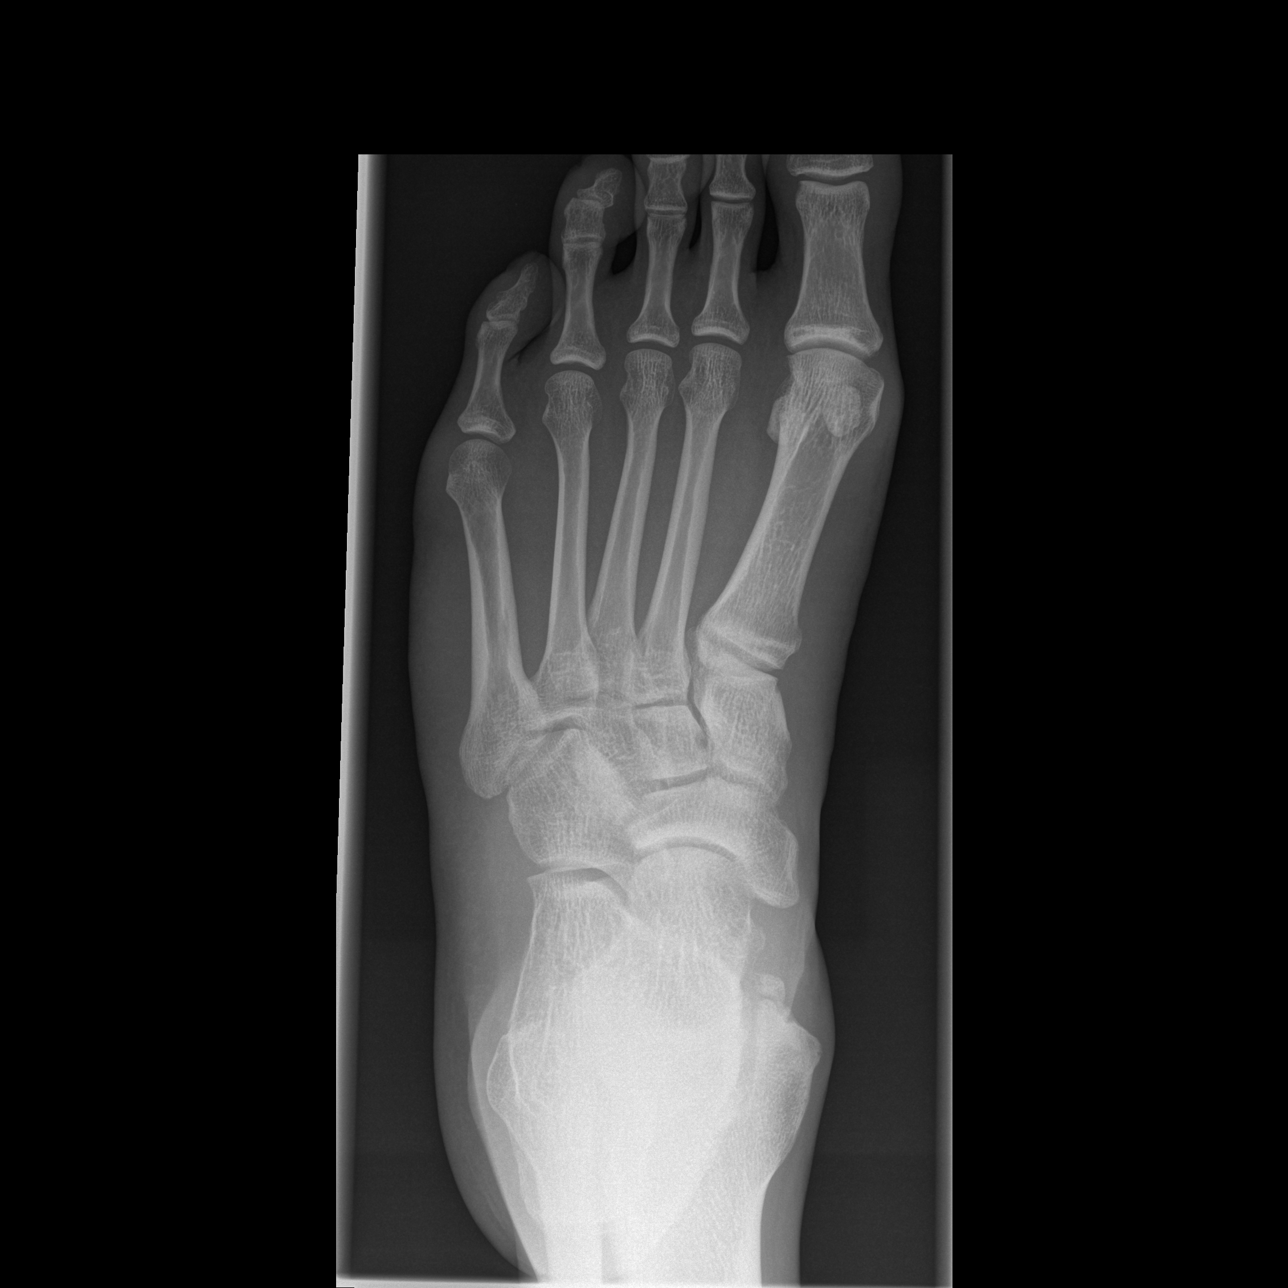

[t foot oblique left]
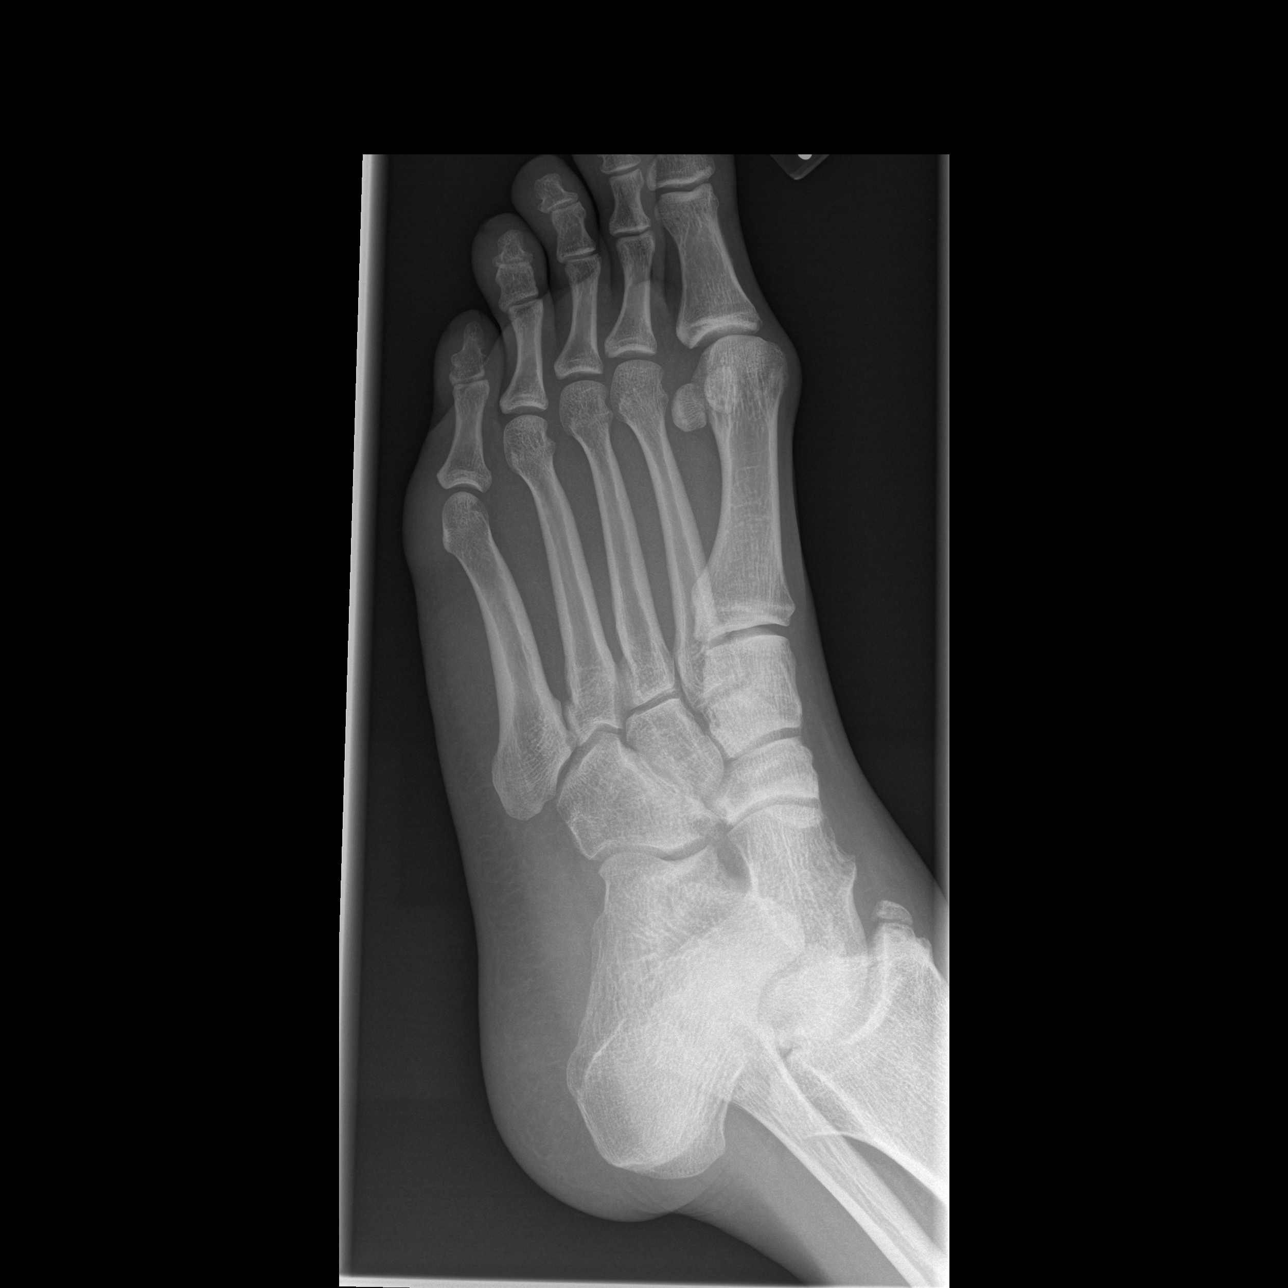

[t foot lat left]
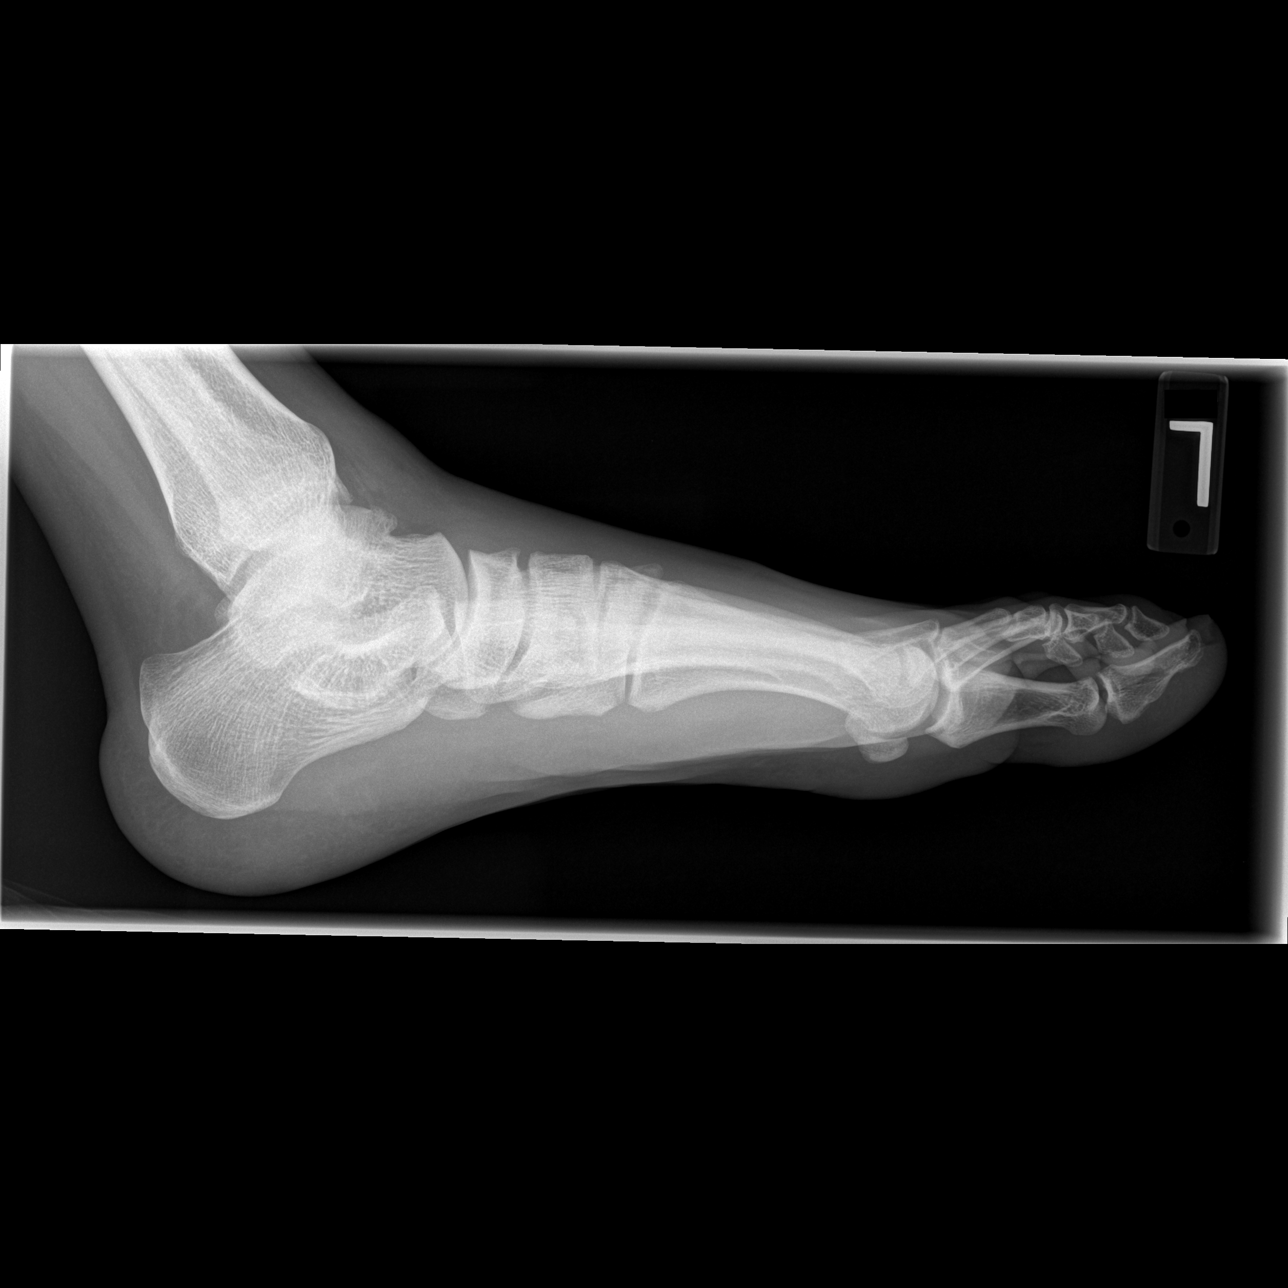

[3 of 3 positions shown; findings below may reference images not displayed]

FINDINGS: Three views of the left foot reveal the bones to be adequately
mineralized. There is no evidence of an acute fracture nor
dislocation. The interphalangeal joints, the metatarsophalangeal
joints, and the tarsometatarsal joints appear normal. The bones of
the hindfoot appear intact. The overlying soft tissues are normal in
appearance.
IMPRESSION: There is no acute bony abnormality of the left foot nor evidence of
significant degenerative change. An accessory ossicle or old
avulsion from the tip of the medial malleolus is demonstrated.

## 2016-04-27 ENCOUNTER — Ambulatory Visit: Payer: Self-pay

## 2016-05-05 ENCOUNTER — Emergency Department: Admission: EM | Admit: 2016-05-05 | Payer: Self-pay | Attending: Emergency Medicine | Admitting: Emergency Medicine

## 2016-05-05 LAB — LIPID(CHOL FRACT) PANEL, BLOOD
Cholesterol: 237 MG/DL — ABNORMAL HIGH (ref <200)
HDL Cholesterol: 57 MG/DL (ref >40)
LDL Cholesterol (calc): 152 MG/DL (ref <160)
Non HDL Cholesterol (calculated): 180 MG/DL — ABNORMAL HIGH (ref <130)
Triglycerides: 139 MG/DL (ref <150)
VLDL Cholesterol (calculated): 28 MG/DL

## 2016-05-05 LAB — COMPREHENSIVE METABOLIC PANEL, BLOOD
ALT: 38 U/L (ref 7–52)
AST: 27 U/L (ref 13–39)
Albumin: 4.8 G/DL (ref 4.2–5.5)
Alk Phos: 71 U/L (ref 34–104)
BUN: 12 mg/dL (ref 7–25)
Bilirubin, Total: 1.3 mg/dL (ref 0.0–1.4)
CO2: 25 mmol/L (ref 21–31)
Calcium: 9.9 mg/dL (ref 8.6–10.3)
Chloride: 102 mmol/L (ref 98–107)
Creat: 0.9 mg/dL (ref 0.7–1.3)
Electrolyte Balance: 11 mmol/L (ref 2–12)
Glucose: 97 mg/dL (ref 70–115)
Potassium: 3.7 mmol/L (ref 3.5–5.1)
Protein, Total: 8 G/DL (ref 6.0–8.3)
Sodium: 138 mmol/L (ref 136–145)
eGFR - high estimate: >60 (ref >59)
eGFR - low estimate: >60 (ref >59)

## 2016-05-05 LAB — CBC WITH DIFF, BLOOD
ANC automated: 5.2 10*3/uL (ref 2.0–8.1)
Basophils %: 1.2 %
Basophils Absolute: 0.1 10*3/uL (ref 0.0–0.2)
Eosinophils %: 2.3 %
Eosinophils Absolute: 0.2 10*3/uL (ref 0.0–0.5)
Hematocrit: 47.2 % (ref 39.5–50.0)
Hgb: 16.2 G/DL (ref 13.5–16.9)
Lymphocytes %: 23.7 %
Lymphocytes Absolute: 1.9 10*3/uL (ref 0.9–3.3)
MCH: 32.2 PG (ref 27.0–33.5)
MCHC: 34.3 G/DL (ref 32.0–35.5)
MCV: 93.8 FL (ref 81.5–97.0)
MPV: 10 FL (ref 7.2–11.7)
Monocytes %: 7 %
Monocytes Absolute: 0.5 10*3/uL (ref 0.0–0.8)
Neutrophils % (A): 65.8 %
PLT Count: 293 10*3/uL (ref 150–400)
RBC: 5.03 10*6/uL (ref 4.38–5.62)
RDW-CV: 15.4 % — ABNORMAL HIGH (ref 11.6–14.4)
White Bld Cell Count: 7.8 10*3/uL (ref 4.0–10.5)

## 2016-05-05 LAB — PT/INR/PTT
INR: 1.13 — ABNORMAL HIGH (ref 0.91–1.09)
PTT: 29.6 s (ref 24.1–36.3)
Prothrombin Time: 13.9 s — ABNORMAL HIGH (ref 11.7–13.5)

## 2016-05-05 LAB — HIV AG/AB STAT SCREEN: HIV Ag/Ab STAT Scrn: NONREACTIVE

## 2016-05-05 LAB — RETAIN BB SAMPLE

## 2016-05-05 LAB — URIC ACID, BLOOD: Uric Acid: 6.7 MG/DL (ref 4.4–7.6)

## 2016-05-09 ENCOUNTER — Ambulatory Visit: Payer: Self-pay | Admitting: Vascular & Interventional Radiology

## 2016-05-10 ENCOUNTER — Ambulatory Visit: Payer: Self-pay | Admitting: Vascular & Interventional Radiology

## 2016-05-17 ENCOUNTER — Ambulatory Visit: Payer: Self-pay

## 2016-05-24 ENCOUNTER — Ambulatory Visit: Payer: Self-pay | Admitting: Vascular & Interventional Radiology

## 2016-05-30 ENCOUNTER — Ambulatory Visit: Payer: Self-pay | Admitting: Vascular & Interventional Radiology

## 2016-06-04 ENCOUNTER — Ambulatory Visit: Payer: Self-pay | Admitting: Vascular & Interventional Radiology

## 2016-06-11 ENCOUNTER — Other Ambulatory Visit: Payer: Self-pay | Admitting: Vascular & Interventional Radiology

## 2016-06-11 ENCOUNTER — Other Ambulatory Visit (HOSPITAL_BASED_OUTPATIENT_CLINIC_OR_DEPARTMENT_OTHER)
Admit: 2016-06-11 | Discharge: 2016-06-11 | Disposition: A | Discharge status: Home / Self Care | Payer: Medicaid Other | Attending: Vascular & Interventional Radiology | Admitting: Vascular & Interventional Radiology

## 2016-06-11 ENCOUNTER — Ambulatory Visit (HOSPITAL_BASED_OUTPATIENT_CLINIC_OR_DEPARTMENT_OTHER): Payer: Medicaid Other

## 2016-06-11 ENCOUNTER — Other Ambulatory Visit
Admission: RE | Admit: 2016-06-11 | Discharge: 2016-06-11 | Disposition: A | Discharge status: Home / Self Care | Payer: Medicaid Other | Source: Ambulatory Visit | Attending: Vascular & Interventional Radiology | Admitting: Vascular & Interventional Radiology

## 2016-06-11 VITALS — BP 126/75 | HR 87 | Temp 97.8°F | Resp 16 | Ht 74.0 in | Wt 234.5 lb

## 2016-06-11 DIAGNOSIS — I739 Peripheral vascular disease, unspecified: Principal | ICD-10-CM

## 2016-06-11 DIAGNOSIS — Z01818 Encounter for other preprocedural examination: Principal | ICD-10-CM | POA: Insufficient documentation

## 2016-06-11 DIAGNOSIS — G8929 Other chronic pain: Secondary | ICD-10-CM

## 2016-06-11 DIAGNOSIS — M79605 Pain in left leg: Secondary | ICD-10-CM | POA: Insufficient documentation

## 2016-06-11 DIAGNOSIS — M79662 Pain in left lower leg: Secondary | ICD-10-CM

## 2016-06-11 LAB — CBC WITH DIFF, BLOOD
ANC automated: 4.9 10*3/uL (ref 2.0–8.1)
Basophils %: 0.7 %
Basophils Absolute: 0.1 10*3/uL (ref 0.0–0.2)
Eosinophils %: 5.8 %
Eosinophils Absolute: 0.5 10*3/uL (ref 0.0–0.5)
Hematocrit: 42.1 % (ref 39.5–50.0)
Hgb: 14.3 G/DL (ref 13.5–16.9)
Lymphocytes %: 29.4 %
Lymphocytes Absolute: 2.4 10*3/uL (ref 0.9–3.3)
MCH: 32.4 PG (ref 27.0–33.5)
MCHC: 33.9 G/DL (ref 32.0–35.5)
MCV: 95.5 FL (ref 81.5–97.0)
MPV: 11.1 FL (ref 7.2–11.7)
Monocytes %: 5 %
Monocytes Absolute: 0.4 10*3/uL (ref 0.0–0.8)
Neutrophils % (A): 59.1 %
PLT Count: 206 10*3/uL (ref 150–400)
RBC: 4.41 10*6/uL (ref 3.70–5.00)
RDW-CV: 14.5 % — ABNORMAL HIGH (ref 11.6–14.4)
White Bld Cell Count: 8.3 10*3/uL (ref 4.0–10.5)

## 2016-06-11 LAB — BASIC METABOLIC PANEL, BLOOD
BUN: 10 mg/dL (ref 7–25)
CO2: 32 mmol/L — ABNORMAL HIGH (ref 21–31)
Calcium: 9.7 mg/dL (ref 8.6–10.3)
Chloride: 104 mmol/L (ref 98–107)
Creat: 0.9 mg/dL (ref 0.7–1.3)
Electrolyte Balance: 4 mmol/L (ref 2–12)
Glucose: 90 mg/dL (ref 70–115)
Potassium: 4.1 mmol/L (ref 3.5–5.1)
Sodium: 140 mmol/L (ref 136–145)
eGFR - high estimate: >60 (ref >59)
eGFR - low estimate: >60 (ref >59)

## 2016-06-11 MED ORDER — HYDROCODONE-ACETAMINOPHEN 5-325 MG OR TABS
1.0000 | ORAL_TABLET | Freq: Two times a day (BID) | ORAL | 0 refills | Status: DC | PRN
Start: 2016-06-11 — End: 2016-07-27

## 2016-06-11 MED ORDER — ASPIRIN 81 PO
81.00 mg | Freq: Every day | ORAL | Status: DC
Start: ? — End: 2017-01-09

## 2016-06-11 MED ORDER — PENTOXIFYLLINE CR 400 MG OR TBCR
400.00 mg | EXTENDED_RELEASE_TABLET | Freq: Three times a day (TID) | ORAL | Status: DC
Start: ? — End: 2016-09-12

## 2016-06-11 MED ORDER — GABAPENTIN 300 MG OR CAPS
300.00 mg | ORAL_CAPSULE | Freq: Three times a day (TID) | ORAL | Status: DC
Start: ? — End: 2016-07-27

## 2016-06-13 LAB — TYPE, SCREEN + HOLD (PRETRANSFUSION TESTING)
ABO/Rh(D): B POS
Antibody Screen Result: NEGATIVE
Units Ordered: 4

## 2016-06-13 NOTE — Anesthesia Preprocedure Evaluation (Addendum)
ANESTHESIA PRE-OPERATIVE EVALUATION    Patient Information    Name: Jerome Hunt    MRN: [id number]    DOB: 14-Oct-1975    Age: 40 year old    Sex: male  Procedure(s):  LEFT POPLITEAL TO POSTERIOR TIBILA ARTERY BYPASS WITH LEFT SAPHENOUS VEIN AORTOGRAM LEFT LEG ANGIOGRAM      X  X      There were no vitals taken for this visit.        Primary language spoken:  English    ROS/Medical History:      General Review & History of Anesthetic Complications:    chronic pain patient (on norco 5 and gabapentin) no history of anesthetic complications and no PONV     03/2016 LLE angiogram with attempted angioplasty Cardiovascular:    Exercise tolerance: >4 METS  (+)  hypercholesterolemia  hypertension  valvular problems/murmurs (congenital murmur per pt)            ECG reviewed Comment: 11/1/17NSR 75bpm borderline R axis deviation  ROS comment: LLE angiogram 03/22/2016 at [facility], which showed tri-vessel disease. A balloon angioplasty and thrombectomy of his left peroneal and posterior tibial artery was attempted, but unable to perform thrombolysis due to chronic clot. Currently on pentoxifylline 06/05/2015 started on amitriptyline for pain by PCP Robbi Garter MD per her note, which also says pain has improved.         Pulmonary:     (+) recent URI (3 weeks ago resolved sx) resolved, , , , ,       ROS comment: 12pack year smoker quit 03/2016 Hematology/Oncology:   - negative ROS   Neuro/Psych:        (+) psychiatric history (anxiety and depression on amitryptilline),     Infectious Disease:   Negative ROS         Endo/Other:        ROS Comments: Obese BMI of 30.1 GI/Hepatic:     (+) GERD,     ROS Comments: History of alcoholism, in remission per PMD note of 06/04/2016   Renal:    - Negative ROS        Pregnancy History:             Pre Anesthesia Testing (PCC/CPC) notes/comments:    [facility] Test & records reviewed by [facility] provider    Ready:  :            Regional anesthesia evaluation: Patient is a candidate for regional block procedure.            Physical Exam    Airway:  Inter-inciser distance > 4 cm  Prognanth Able    Mallampati: II  Neck ROM: full  TM distance: > 6 cm  Short thick neck: No        Cardiovascular:  - cardiovascular exam normal         Pulmonary:  - pulmonary exam normal           Neuro/Neck/Skeletal/Skin:  -  ANE PHYS EXAM NEGATIVE ROS SKIN SKELETAL NEURO NECK          Dental:  - normal exam    Abdominal:              Last  OSA (STOP BANG) Score:  No Data Recorded    Last OSA  (STOP) Score for   No Data Recorded                 No past medical history on file.  No  past surgical history on file.  Social History   Substance Use Topics    Smoking status: Former Smoker    Smokeless tobacco: Former Systems developer    Alcohol use Not on file       No current facility-administered medications for this encounter.      Current Outpatient Prescriptions   Medication Sig Dispense Refill    ASPIRIN 81 PO Take 81 mg by mouth.      gabapentin (NEURONTIN) 300 MG capsule Take 300 mg by mouth 3 times daily.      HYDROcodone-acetaminophen (NORCO) 5-325 MG tablet Take 1 tablet by mouth every 12 hours as needed for Severe Pain (Pain Score 7-10). 45 tablet 0    pentoxifylline (TRENTAL) 400 MG Controlled-Release tablet Take 400 mg by mouth.       No Known Allergies    Labs and Other Data  Lab Results   Component Value Date    K 4.1 06/11/2016    CL 104 06/11/2016    BUN 10 06/11/2016    CREAT 0.9 06/11/2016    GLU 90 06/11/2016    Emp[place] 9.7 06/11/2016     Lab Results   Component Value Date    AST 27 05/05/2016    ALT 38 05/05/2016    ALK 71 05/05/2016    ALB 4.8 05/05/2016    TBILI 1.3 05/05/2016     Lab Results   Component Value Date    RBC 4.41 06/11/2016    HGB 14.3 06/11/2016    MCV 95.5 06/11/2016    MCHC 33.9 06/11/2016    RDW 14.5 06/11/2016     Lab Results   Component Value Date    INR 1.13 05/05/2016    PTT 29.6 05/05/2016     No results found for: ARTPH, ARTPO2, ARTPCO2    Anesthesia Plan:  Risks and Benefits of Anesthesia   I personally examined the patient immediately prior to the anesthetic and reviewed the pertinent medical history, drug and allergy history, laboratory and imaging studies and consultations. I have determined that the patient has had adequate assessment and testing.    Anesthetic techniques, invasive monitors, anesthetic drugs for induction, maintenance and post-operative analgesia, risks and alternatives have been explained to the patient and/or patient's representatives.    I have prescribed the anesthetic plan:         Planned anesthesia method: General         ASA 2 (Mild systemic disease)     Potential anesthesia problems identified and risks including but not limited to the following were discussed with patient and/or patient's representative: Adverse or allergic drug reaction, Administration of blood products, Recall, Ocular injury, Dental injury or sore throat, Nerve injury, Injury to brain, heart and other organs and Death    No Beta Blocker Indicated: Does not meet criteriaPlanned monitoring method: Routine monitoring and Arterial line monitoring  Comments: (A-line for possible blood loss, frequent ACTs)    Informed Consent:  Anesthetic plan and risks discussed with Patient.    Plan discussed with Resident and Attending.
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
## 2016-06-18 ENCOUNTER — Ambulatory Visit: Payer: Medicaid Other

## 2016-06-18 ENCOUNTER — Telehealth: Payer: Self-pay

## 2016-06-20 ENCOUNTER — Telehealth: Payer: Self-pay

## 2016-06-21 ENCOUNTER — Telehealth: Payer: Self-pay

## 2016-06-21 NOTE — Telephone Encounter (Signed)
PCP OFFICE FOR THIS PT CALLING AND WAS CONNECTED TO PAM RE GETTING PRE OP ORDERS FOR THIS PT [phone] EX [phone]  THANK YOU

## 2016-06-21 NOTE — Telephone Encounter (Signed)
Received report from last visit already

## 2016-06-25 ENCOUNTER — Telehealth: Payer: Self-pay | Admitting: Vascular & Interventional Radiology

## 2016-06-25 NOTE — Telephone Encounter (Signed)
Patient is requesting to speak with Pam. Stated he is having surgery tomorrow and wants to check if Pam received clearance from his PCP and also needs the time of his surgery. [facility]. Thank you

## 2016-06-25 NOTE — Telephone Encounter (Signed)
Spoke with patient, clearance received, surgery date changed, Perla to contact patient and go over new date.

## 2016-07-02 ENCOUNTER — Ambulatory Visit: Payer: Medicaid Other

## 2016-07-05 MED ORDER — IODIXANOL 320 MG/ML IV SOLN
INTRAVENOUS | Status: AC
Start: 2016-07-05 — End: 2016-07-05
  Filled 2016-07-05: qty 150

## 2016-07-05 MED ORDER — THROMBIN 5000 UNIT EX SOLR
CUTANEOUS | Status: AC
Start: 2016-07-05 — End: 2016-07-05
  Filled 2016-07-05: qty 2

## 2016-07-05 MED ORDER — IODIXANOL 320 MG/ML IV SOLN
INTRAVENOUS | Status: AC
Start: 2016-07-05 — End: 2016-07-05
  Filled 2016-07-05: qty 50

## 2016-07-05 MED ORDER — BACITRACIN 50000 UNIT IM SOLR
INTRAMUSCULAR | Status: AC
Start: 2016-07-05 — End: 2016-07-05
  Filled 2016-07-05: qty 50000

## 2016-07-05 MED ORDER — PAPAVERINE HCL 30 MG/ML IJ SOLN
INTRAMUSCULAR | Status: AC
Start: 2016-07-05 — End: 2016-07-05
  Filled 2016-07-05: qty 2

## 2016-07-05 MED ORDER — HEPARIN SODIUM (PORCINE) 5000 UNIT/ML IJ SOLN
INTRAMUSCULAR | Status: AC
Start: 2016-07-05 — End: 2016-07-05
  Filled 2016-07-05: qty 1

## 2016-07-06 ENCOUNTER — Inpatient Hospital Stay: Payer: No Typology Code available for payment source

## 2016-07-06 ENCOUNTER — Encounter
Admission: RE | Disposition: A | Discharge status: Home Health | Payer: Self-pay | Attending: Vascular & Interventional Radiology

## 2016-07-06 ENCOUNTER — Inpatient Hospital Stay (HOSPITAL_COMMUNITY): Payer: No Typology Code available for payment source

## 2016-07-06 ENCOUNTER — Inpatient Hospital Stay
Admission: RE | Admit: 2016-07-06 | Discharge: 2016-07-27 | DRG: 252 | Disposition: A | Discharge status: Home Health | Payer: No Typology Code available for payment source | Attending: Vascular & Interventional Radiology | Admitting: Vascular & Interventional Radiology

## 2016-07-06 DIAGNOSIS — M6281 Muscle weakness (generalized): Secondary | ICD-10-CM

## 2016-07-06 DIAGNOSIS — R918 Other nonspecific abnormal finding of lung field: Secondary | ICD-10-CM

## 2016-07-06 DIAGNOSIS — G8929 Other chronic pain: Secondary | ICD-10-CM | POA: Diagnosis present

## 2016-07-06 DIAGNOSIS — T82868A Thrombosis of vascular prosthetic devices, implants and grafts, initial encounter: Secondary | ICD-10-CM | POA: Diagnosis not present

## 2016-07-06 DIAGNOSIS — I70212 Atherosclerosis of native arteries of extremities with intermittent claudication, left leg: Principal | ICD-10-CM | POA: Diagnosis present

## 2016-07-06 DIAGNOSIS — J9602 Acute respiratory failure with hypercapnia: Secondary | ICD-10-CM | POA: Diagnosis not present

## 2016-07-06 DIAGNOSIS — Y712 Prosthetic and other implants, materials and accessory cardiovascular devices associated with adverse incidents: Secondary | ICD-10-CM | POA: Diagnosis not present

## 2016-07-06 DIAGNOSIS — J9601 Acute respiratory failure with hypoxia: Secondary | ICD-10-CM | POA: Diagnosis not present

## 2016-07-06 DIAGNOSIS — F10931 Alcohol use, unspecified with withdrawal delirium (CMS-HCC): Secondary | ICD-10-CM | POA: Diagnosis present

## 2016-07-06 DIAGNOSIS — IMO0001 Reserved for inherently not codable concepts without codable children: Secondary | ICD-10-CM | POA: Diagnosis present

## 2016-07-06 DIAGNOSIS — J9811 Atelectasis: Secondary | ICD-10-CM

## 2016-07-06 DIAGNOSIS — I739 Peripheral vascular disease, unspecified: Secondary | ICD-10-CM

## 2016-07-06 DIAGNOSIS — I743 Embolism and thrombosis of arteries of the lower extremities: Secondary | ICD-10-CM | POA: Diagnosis not present

## 2016-07-06 DIAGNOSIS — R41841 Cognitive communication deficit: Secondary | ICD-10-CM

## 2016-07-06 DIAGNOSIS — Z4682 Encounter for fitting and adjustment of non-vascular catheter: Secondary | ICD-10-CM

## 2016-07-06 DIAGNOSIS — E872 Acidosis: Secondary | ICD-10-CM | POA: Diagnosis not present

## 2016-07-06 DIAGNOSIS — F4310 Post-traumatic stress disorder, unspecified: Secondary | ICD-10-CM | POA: Diagnosis present

## 2016-07-06 DIAGNOSIS — I999 Unspecified disorder of circulatory system: Secondary | ICD-10-CM

## 2016-07-06 DIAGNOSIS — Z781 Physical restraint status: Secondary | ICD-10-CM

## 2016-07-06 DIAGNOSIS — A0472 Enterocolitis due to Clostridium difficile, not specified as recurrent: Secondary | ICD-10-CM | POA: Diagnosis not present

## 2016-07-06 DIAGNOSIS — F10231 Alcohol dependence with withdrawal delirium: Secondary | ICD-10-CM | POA: Diagnosis present

## 2016-07-06 DIAGNOSIS — A419 Sepsis, unspecified organism: Secondary | ICD-10-CM | POA: Diagnosis not present

## 2016-07-06 DIAGNOSIS — Z87891 Personal history of nicotine dependence: Secondary | ICD-10-CM

## 2016-07-06 DIAGNOSIS — Z7409 Other reduced mobility: Secondary | ICD-10-CM

## 2016-07-06 DIAGNOSIS — R1310 Dysphagia, unspecified: Secondary | ICD-10-CM

## 2016-07-06 DIAGNOSIS — Y92238 Other place in hospital as the place of occurrence of the external cause: Secondary | ICD-10-CM | POA: Diagnosis not present

## 2016-07-06 DIAGNOSIS — I70222 Atherosclerosis of native arteries of extremities with rest pain, left leg: Secondary | ICD-10-CM | POA: Diagnosis present

## 2016-07-06 DIAGNOSIS — Z789 Other specified health status: Secondary | ICD-10-CM

## 2016-07-06 DIAGNOSIS — D62 Acute posthemorrhagic anemia: Secondary | ICD-10-CM | POA: Diagnosis not present

## 2016-07-06 HISTORY — DX: Alcohol dependence with withdrawal delirium (CMS-HCC): F10.231

## 2016-07-06 HISTORY — DX: Alcohol use, unspecified with withdrawal delirium (CMS-HCC): F10.931

## 2016-07-06 HISTORY — DX: Enterocolitis due to Clostridium difficile, not specified as recurrent: A04.72

## 2016-07-06 HISTORY — DX: Reserved for inherently not codable concepts without codable children: IMO0001

## 2016-07-06 LAB — BLOOD GAS + ELECTROLYTES (ISTAT)
Base Excess (iSTAT): NEGATIVE MMOL/L
Base Excess (iSTAT): NEGATIVE MMOL/L
Base Excess (iSTAT): NEGATIVE MMOL/L
Base Excess (iSTAT): NEGATIVE MMOL/L
Base Excess (iSTAT): NEGATIVE MMOL/L
Base Excess (iSTAT): NEGATIVE MMOL/L
Base Excess (iSTAT): NEGATIVE MMOL/L
Base Excess (iSTAT): NEGATIVE MMOL/L
Base Excess (iSTAT): NEGATIVE MMOL/L
Calcium Ionized (iSTAT): 1.22 mmol/L (ref 1.13–1.32)
Calcium Ionized (iSTAT): 1.15 mmol/L (ref 1.13–1.32)
Calcium Ionized (iSTAT): 1.11 mmol/L — ABNORMAL LOW (ref 1.13–1.32)
Calcium Ionized (iSTAT): 1.1 mmol/L — ABNORMAL LOW (ref 1.13–1.32)
Calcium Ionized (iSTAT): 1.11 mmol/L — ABNORMAL LOW (ref 1.13–1.32)
Calcium Ionized (iSTAT): 1.05 mmol/L — ABNORMAL LOW (ref 1.13–1.32)
Calcium Ionized (iSTAT): 1.02 mmol/L — ABNORMAL LOW (ref 1.13–1.32)
Calcium Ionized (iSTAT): 1.03 mmol/L — ABNORMAL LOW (ref 1.13–1.32)
Calcium Ionized (iSTAT): 1.18 mmol/L (ref 1.13–1.32)
Glucose (iSTAT): 109 MG/DL (ref 70–110)
Glucose (iSTAT): 140 MG/DL — ABNORMAL HIGH (ref 70–110)
Glucose (iSTAT): 137 MG/DL — ABNORMAL HIGH (ref 70–110)
Glucose (iSTAT): 159 MG/DL — ABNORMAL HIGH (ref 70–110)
Glucose (iSTAT): 176 MG/DL — ABNORMAL HIGH (ref 70–110)
Glucose (iSTAT): 176 MG/DL — ABNORMAL HIGH (ref 70–110)
Glucose (iSTAT): 188 MG/DL — ABNORMAL HIGH (ref 70–110)
Glucose (iSTAT): 193 MG/DL — ABNORMAL HIGH (ref 70–110)
Glucose (iSTAT): 158 MG/DL — ABNORMAL HIGH (ref 70–110)
HCO3 (iSTAT): 25 MMOL/L (ref 21–27)
HCO3 (iSTAT): 24 MMOL/L (ref 21–27)
HCO3 (iSTAT): 22 MMOL/L (ref 21–27)
HCO3 (iSTAT): 22 MMOL/L (ref 21–27)
HCO3 (iSTAT): 23 MMOL/L (ref 21–27)
HCO3 (iSTAT): 22 MMOL/L (ref 21–27)
HCO3 (iSTAT): 21 MMOL/L (ref 21–27)
HCO3 (iSTAT): 21 MMOL/L (ref 21–27)
HCO3 (iSTAT): 24 MMOL/L (ref 21–27)
K, (iSTAT): 4.3 MMOL/L (ref 3.7–5.5)
K, (iSTAT): 4.8 MMOL/L (ref 3.7–5.5)
K, (iSTAT): 5.1 MMOL/L (ref 3.7–5.5)
K, (iSTAT): 4.6 MMOL/L (ref 3.7–5.5)
K, (iSTAT): 4.3 MMOL/L (ref 3.7–5.5)
K, (iSTAT): 4 MMOL/L (ref 3.7–5.5)
K, (iSTAT): 4.4 MMOL/L (ref 3.7–5.5)
K, (iSTAT): 4.6 MMOL/L (ref 3.7–5.5)
K, (iSTAT): 5.9 MMOL/L — ABNORMAL HIGH (ref 3.7–5.5)
O2 Sat (iSTAT): 99 % — ABNORMAL HIGH (ref 95–98)
O2 Sat (iSTAT): 99 % — ABNORMAL HIGH (ref 95–98)
O2 Sat (iSTAT): 98 % (ref 95–98)
O2 Sat (iSTAT): 97 % (ref 95–98)
O2 Sat (iSTAT): 97 % (ref 95–98)
O2 Sat (iSTAT): 95 % (ref 95–98)
O2 Sat (iSTAT): 98 % (ref 95–98)
O2 Sat (iSTAT): 99 % — ABNORMAL HIGH (ref 95–98)
O2 Sat (iSTAT): 100 % — ABNORMAL HIGH (ref 95–98)
PCO2 (iSTAT): 46 MMHG — ABNORMAL HIGH (ref 36–42)
PCO2 (iSTAT): 46 MMHG — ABNORMAL HIGH (ref 36–42)
PCO2 (iSTAT): 47 MMHG — ABNORMAL HIGH (ref 36–42)
PCO2 (iSTAT): 45 MMHG — ABNORMAL HIGH (ref 36–42)
PCO2 (iSTAT): 44 MMHG — ABNORMAL HIGH (ref 36–42)
PCO2 (iSTAT): 47 MMHG — ABNORMAL HIGH (ref 36–42)
PCO2 (iSTAT): 50 MMHG — ABNORMAL HIGH (ref 36–42)
PCO2 (iSTAT): 50 MMHG — ABNORMAL HIGH (ref 36–42)
PCO2 (iSTAT): 60 MMHG — ABNORMAL HIGH (ref 36–42)
PO2 (iSTAT): 124 MMHG — ABNORMAL HIGH (ref 80–104)
PO2 (iSTAT): 129 MMHG — ABNORMAL HIGH (ref 80–104)
PO2 (iSTAT): 110 MMHG — ABNORMAL HIGH (ref 80–104)
PO2 (iSTAT): 104 MMHG (ref 80–104)
PO2 (iSTAT): 94 MMHG (ref 80–104)
PO2 (iSTAT): 87 MMHG (ref 80–104)
PO2 (iSTAT): 134 MMHG — ABNORMAL HIGH (ref 80–104)
PO2 (iSTAT): 154 MMHG — ABNORMAL HIGH (ref 80–104)
PO2 (iSTAT): 288 MMHG — ABNORMAL HIGH (ref 80–104)
Sodium (iSTAT): 140 MMOL/L (ref 138–146)
Sodium (iSTAT): 138 MMOL/L (ref 138–146)
Sodium (iSTAT): 139 MMOL/L (ref 138–146)
Sodium (iSTAT): 139 MMOL/L (ref 138–146)
Sodium (iSTAT): 139 MMOL/L (ref 138–146)
Sodium (iSTAT): 139 MMOL/L (ref 138–146)
Sodium (iSTAT): 138 MMOL/L (ref 138–146)
Sodium (iSTAT): 138 MMOL/L (ref 138–146)
Sodium (iSTAT): 136 MMOL/L — ABNORMAL LOW (ref 138–146)
Total CO2 (iSTAT): 26 MMOL/L (ref 23–29)
Total CO2 (iSTAT): 25 MMOL/L (ref 23–29)
Total CO2 (iSTAT): 24 MMOL/L (ref 23–29)
Total CO2 (iSTAT): 24 MMOL/L (ref 23–29)
Total CO2 (iSTAT): 24 MMOL/L (ref 23–29)
Total CO2 (iSTAT): 23 MMOL/L (ref 23–29)
Total CO2 (iSTAT): 22 MMOL/L — ABNORMAL LOW (ref 23–29)
Total CO2 (iSTAT): 22 MMOL/L — ABNORMAL LOW (ref 23–29)
Total CO2 (iSTAT): 26 MMOL/L (ref 23–29)
pH (iSTAT): 7.34 — ABNORMAL LOW (ref 7.38–7.42)
pH (iSTAT): 7.32 — ABNORMAL LOW (ref 7.38–7.42)
pH (iSTAT): 7.29 — ABNORMAL LOW (ref 7.38–7.42)
pH (iSTAT): 7.3 — ABNORMAL LOW (ref 7.38–7.42)
pH (iSTAT): 7.32 — ABNORMAL LOW (ref 7.38–7.42)
pH (iSTAT): 7.27 — ABNORMAL LOW (ref 7.38–7.42)
pH (iSTAT): 7.22 — ABNORMAL LOW (ref 7.38–7.42)
pH (iSTAT): 7.22 — ABNORMAL LOW (ref 7.38–7.42)
pH (iSTAT): 7.2 — ABNORMAL LOW (ref 7.38–7.42)

## 2016-07-06 LAB — CBC WITH DIFF, BLOOD
Atypical Lymphocytes %: 1.9 %
Atypical Lymphocytes Absolute: 0.3 10*3/uL (ref 0.0–0.5)
Bands % (M): 4.8 %
Bands Abs (M): 0.9 10*3/uL — ABNORMAL HIGH (ref 0.0–0.6)
Basophils %: 0.9 %
Basophils Absolute: 0.2 10*3/uL (ref 0.0–0.2)
Eosinophils %: 0 %
Eosinophils Absolute: 0 10*3/uL (ref 0.0–0.5)
Hematocrit: 37.5 % — ABNORMAL LOW (ref 39.5–50.0)
Hgb: 12.3 G/DL — ABNORMAL LOW (ref 13.5–16.9)
Lymphocytes %.: 8.6 %
Lymphocytes Absolute: 1.5 10*3/uL (ref 0.9–3.3)
MCH: 31.6 PG (ref 27.0–33.5)
MCHC: 32.8 G/DL (ref 32.0–35.5)
MCV: 96.1 FL (ref 81.5–97.0)
MPV: 10.3 FL (ref 7.2–11.7)
Monocytes %: 1.9 %
Monocytes Absolute: 0.3 10*3/uL (ref 0.0–0.8)
PLT Count: 262 10*3/uL (ref 150–400)
Platelet Morphology: NORMAL
RBC Morphology: NORMAL
RBC: 3.9 10*6/uL (ref 3.70–5.00)
RDW-CV: 15 % — ABNORMAL HIGH (ref 11.6–14.4)
Seg Neutro % (M): 81.9 %
Seg Neutro Abs (M): 14.6 10*3/uL — ABNORMAL HIGH (ref 2.0–7.5)
White Bld Cell Count: 17.8 10*3/uL — ABNORMAL HIGH (ref 4.0–10.5)

## 2016-07-06 LAB — COMPREHENSIVE METABOLIC PANEL, BLOOD
ALT: 13 U/L (ref 7–52)
AST: 16 U/L (ref 13–39)
Albumin: 4.4 G/DL (ref 4.2–5.5)
Alk Phos: 57 U/L (ref 34–104)
BUN: 8 mg/dL (ref 7–25)
Bilirubin, Total: 0.3 mg/dL (ref 0.0–1.4)
CO2: 22 mmol/L (ref 21–31)
Calcium: 10.6 mg/dL — ABNORMAL HIGH (ref 8.6–10.3)
Chloride: 104 mmol/L (ref 98–107)
Creat: 1.1 mg/dL (ref 0.7–1.3)
Electrolyte Balance: 11 mmol/L (ref 2–12)
Glucose: 167 mg/dL — ABNORMAL HIGH (ref 70–115)
Potassium: 5.5 mmol/L — ABNORMAL HIGH (ref 3.5–5.1)
Protein, Total: 7 G/DL (ref 6.0–8.3)
Sodium: 137 mmol/L (ref 136–145)
eGFR - high estimate: >60 (ref >59)
eGFR - low estimate: >60 (ref >59)

## 2016-07-06 LAB — BASIC METABOLIC PANEL, BLOOD
BUN: 9 mg/dL (ref 7–25)
CO2: 19 mmol/L — ABNORMAL LOW (ref 21–31)
Calcium: 9.9 mg/dL (ref 8.6–10.3)
Chloride: 102 mmol/L (ref 98–107)
Creat: 1.1 mg/dL (ref 0.7–1.3)
Electrolyte Balance: 16 mmol/L — ABNORMAL HIGH (ref 2–12)
Glucose: 140 mg/dL — ABNORMAL HIGH (ref 70–115)
Potassium: 4.3 mmol/L (ref 3.5–5.1)
Sodium: 137 mmol/L (ref 136–145)
eGFR - high estimate: >60 (ref >59)
eGFR - low estimate: >60 (ref >59)

## 2016-07-06 LAB — TYPE, SCREEN + HOLD (PRETRANSFUSION TESTING)
ABO/Rh(D): B POS
Antibody Screen Result: NEGATIVE
Units Ordered: 4

## 2016-07-06 LAB — ACT K (ISTAT) POINT OF CARE TESTING
ACT K (iSTAT) POC: 120 SEC. (ref 74–137)
ACT K (iSTAT) POC: 268 SEC. — ABNORMAL HIGH (ref 74–137)
ACT K (iSTAT) POC: 219 SEC. — ABNORMAL HIGH (ref 74–137)
ACT K (iSTAT) POC: 219 SEC. — ABNORMAL HIGH (ref 74–137)
ACT K (iSTAT) POC: 219 SEC. — ABNORMAL HIGH (ref 74–137)
ACT K (iSTAT) POC: 257 SEC. — ABNORMAL HIGH (ref 74–137)
ACT K (iSTAT) POC: 202 SEC. — ABNORMAL HIGH (ref 74–137)
ACT K (iSTAT) POC: 224 SEC. — ABNORMAL HIGH (ref 74–137)

## 2016-07-06 LAB — ARTERIAL BLOOD GAS
Base Excess: NEGATIVE mmol/L
HCO3: 19.4 mmol/L — ABNORMAL LOW (ref 21.0–27.0)
Inspired O2: 0.5
O2 Sat: 99 % (ref 95.0–99.0)
Patient Temp: 37.1 DEG C
pCO2 (Temp Adjusted): 40.6 MMHG (ref 36.0–42.0)
pH (Temp Adjusted): 7.3 — ABNORMAL LOW (ref 7.38–7.42)
pO2 (Temp Adjusted): 191.7 MMHG — ABNORMAL HIGH (ref 80.0–104.0)

## 2016-07-06 LAB — PT/INR/PTT
INR: 1.29 — ABNORMAL HIGH (ref 0.89–1.12)
PTT: >200 s (ref 24.1–36.3)
Prothrombin Time: 16 s — ABNORMAL HIGH (ref 12.1–14.4)

## 2016-07-06 LAB — LACTATE, BLOOD
Lactic Acid: 6.8 mmol/L — ABNORMAL HIGH (ref 0.5–2.2)
Lactic Acid: 7.6 mmol/L — ABNORMAL HIGH (ref 0.5–2.2)

## 2016-07-06 LAB — TROPONIN I, BLOOD: Troponin I: <0.03 ng/mL (ref 0.00–0.03)

## 2016-07-06 LAB — PHOSPHORUS, BLOOD: Phosphorus: 3.9 MG/DL (ref 2.5–5.0)

## 2016-07-06 LAB — APTT, BLOOD: PTT: >200 s (ref 24.1–36.3)

## 2016-07-06 LAB — CPK-CREATINE PHOSPHOKINASE, BLOOD
CK: 327 U/L — ABNORMAL HIGH (ref 30–223)
CK: 654 U/L — ABNORMAL HIGH (ref 30–223)

## 2016-07-06 LAB — PTT CRT VAL CALL

## 2016-07-06 LAB — MAGNESIUM, BLOOD: Magnesium: 1.6 mg/dL — ABNORMAL LOW (ref 1.9–2.7)

## 2016-07-06 SURGERY — SURGICAL PROCUREMENT, VEIN
Anesthesia: General | Wound class: Class I (Clean)

## 2016-07-06 MED ORDER — IODIXANOL 320 MG/ML IV SOLN
INTRAVENOUS | Status: DC | PRN
Start: 2016-07-06 — End: 2016-07-06
  Administered 2016-07-06 (×2): 20 mL
  Administered 2016-07-06 (×2): 50 mL

## 2016-07-06 MED ORDER — INSULIN REGULAR IV BOLUS FROM BAG
INTRAMUSCULAR | Status: DC | PRN
Start: 2016-07-06 — End: 2016-07-06
  Administered 2016-07-06 (×2): 10 [IU] via INTRAVENOUS

## 2016-07-06 MED ORDER — SODIUM CHLORIDE 0.9 % IR SOLN
Status: DC | PRN
Start: 2016-07-06 — End: 2016-07-06
  Administered 2016-07-06 (×3): 1000 mL

## 2016-07-06 MED ORDER — PROPOFOL 200 MG/20ML IV EMUL
INTRAVENOUS | Status: AC
Start: 2016-07-06 — End: 2016-07-06
  Filled 2016-07-06: qty 20

## 2016-07-06 MED ORDER — FENTANYL CITRATE (PF) 100 MCG/2ML IJ SOLN
INTRAMUSCULAR | Status: DC | PRN
Start: 2016-07-06 — End: 2016-07-06
  Administered 2016-07-06 (×5): 100 ug via INTRAVENOUS

## 2016-07-06 MED ORDER — FENTANYL CITRATE (PF) 100 MCG/2ML IJ SOLN
INTRAMUSCULAR | Status: AC
Start: 2016-07-06 — End: 2016-07-06
  Filled 2016-07-06: qty 2

## 2016-07-06 MED ORDER — LABETALOL HCL 5 MG/ML IV SOLN
INTRAVENOUS | Status: DC | PRN
Start: 2016-07-06 — End: 2016-07-06
  Administered 2016-07-06 (×3): 5 mg via INTRAVENOUS

## 2016-07-06 MED ORDER — HYDROMORPHONE HCL 2 MG/ML IJ SOLN
0.4000 mg | INTRAMUSCULAR | Status: DC | PRN
Start: 2016-07-06 — End: 2016-07-06

## 2016-07-06 MED ORDER — NITROGLYCERIN 5 MG/ML IV SOLN
INTRAVENOUS | Status: DC | PRN
Start: 2016-07-06 — End: 2016-07-06
  Administered 2016-07-06 (×2): 100 ug/min via INTRAVENOUS

## 2016-07-06 MED ORDER — HEPARIN SODIUM (PORCINE) 10000 UNIT/ML IJ SOLN
INTRAMUSCULAR | Status: DC | PRN
Start: 2016-07-06 — End: 2016-07-06
  Administered 2016-07-06: 3000 [IU] via SUBCUTANEOUS
  Administered 2016-07-06 (×2): 2000 [IU] via SUBCUTANEOUS
  Administered 2016-07-06: 9000 [IU] via SUBCUTANEOUS
  Administered 2016-07-06: 3000 [IU] via SUBCUTANEOUS
  Administered 2016-07-06: 2000 [IU] via SUBCUTANEOUS

## 2016-07-06 MED ORDER — HYDROMORPHONE HCL 2 MG/ML IJ SOLN
INTRAMUSCULAR | Status: AC
Start: 2016-07-06 — End: 2016-07-06
  Filled 2016-07-06: qty 1

## 2016-07-06 MED ORDER — PLASMA-LYTE A IV SOLN
INTRAVENOUS | Status: DC
Start: 2016-07-06 — End: 2016-07-06

## 2016-07-06 MED ORDER — HEPARIN 25,000 UNITS/250 ML 1/2 NS PREMIX INFUSION (~~LOC~~)
900.0000 [IU]/h | INTRAMUSCULAR | Status: DC
Start: 2016-07-06 — End: 2016-07-06
  Filled 2016-07-06: qty 250

## 2016-07-06 MED ORDER — ONDANSETRON HCL 4 MG/2ML IV SOLN
INTRAMUSCULAR | Status: DC | PRN
Start: 2016-07-06 — End: 2016-07-06
  Administered 2016-07-06 (×2): 4 mg via INTRAVENOUS

## 2016-07-06 MED ORDER — DEXTROSE-NACL 5-0.9 % IV SOLN (CUSTOM)
INTRAVENOUS | Status: DC
Start: 2016-07-06 — End: 2016-07-10
  Administered 2016-07-06 – 2016-07-10 (×6): via INTRAVENOUS

## 2016-07-06 MED ORDER — MIDAZOLAM HCL 2 MG/2ML IJ SOLN
INTRAMUSCULAR | Status: AC
Start: 2016-07-06 — End: 2016-07-06
  Filled 2016-07-06: qty 2

## 2016-07-06 MED ORDER — ALBUTEROL SULFATE 108 (90 BASE) MCG/ACT IN AERS
INHALATION_SPRAY | RESPIRATORY_TRACT | Status: DC | PRN
Start: 2016-07-06 — End: 2016-07-06
  Administered 2016-07-06 (×3): 10 via RESPIRATORY_TRACT

## 2016-07-06 MED ORDER — HEPARIN 25,000 UNITS/250 ML 1/2 NS PREMIX INFUSION (~~LOC~~)
900.0000 [IU]/h | INTRAMUSCULAR | Status: DC
Start: 2016-07-06 — End: 2016-07-06

## 2016-07-06 MED ORDER — INFLUENZA VAC SPLIT QUAD 0.5 ML IM SUSY
0.5000 mL | PREFILLED_SYRINGE | INTRAMUSCULAR | Status: DC
Start: 2016-07-06 — End: 2016-07-06

## 2016-07-06 MED ORDER — PROPOFOL 1000 MG/100 ML INFUSION (~~LOC~~)
0.0000 ug/kg/min | INTRAVENOUS | Status: DC
Start: 2016-07-06 — End: 2016-07-09
  Administered 2016-07-06 (×4): 65 ug/kg/min via INTRAVENOUS
  Administered 2016-07-07: 60 ug/kg/min via INTRAVENOUS
  Administered 2016-07-07 (×2): 80 ug/kg/min via INTRAVENOUS
  Administered 2016-07-07: 75 ug/kg/min via INTRAVENOUS
  Administered 2016-07-07: 80 ug/kg/min via INTRAVENOUS
  Administered 2016-07-07: 70 ug/kg/min via INTRAVENOUS
  Administered 2016-07-07: 75 ug/kg/min via INTRAVENOUS
  Administered 2016-07-07: 65 ug/kg/min via INTRAVENOUS
  Administered 2016-07-07 (×2): 80 ug/kg/min via INTRAVENOUS
  Administered 2016-07-07: 70 ug/kg/min via INTRAVENOUS
  Administered 2016-07-07 (×4): 80 ug/kg/min via INTRAVENOUS
  Administered 2016-07-07: 50 ug/kg/min via INTRAVENOUS
  Administered 2016-07-07: 80 ug/kg/min via INTRAVENOUS
  Administered 2016-07-08: 70 ug/kg/min via INTRAVENOUS
  Administered 2016-07-08: 60 ug/kg/min via INTRAVENOUS
  Administered 2016-07-08 (×6): 80 ug/kg/min via INTRAVENOUS
  Administered 2016-07-08: 60 ug/kg/min via INTRAVENOUS
  Administered 2016-07-08: 80 ug/kg/min via INTRAVENOUS
  Administered 2016-07-08: 50 ug/kg/min via INTRAVENOUS
  Administered 2016-07-08: 80 ug/kg/min via INTRAVENOUS
  Administered 2016-07-08: 60 ug/kg/min via INTRAVENOUS
  Administered 2016-07-08: 50 ug/kg/min via INTRAVENOUS
  Administered 2016-07-08: 60 ug/kg/min via INTRAVENOUS
  Administered 2016-07-08: 60.037 ug/kg/min via INTRAVENOUS
  Administered 2016-07-08: 80 ug/kg/min via INTRAVENOUS
  Administered 2016-07-08: 70 ug/kg/min via INTRAVENOUS
  Administered 2016-07-08: 60 ug/kg/min via INTRAVENOUS
  Administered 2016-07-08: 80 ug/kg/min via INTRAVENOUS
  Administered 2016-07-08: 60.037 ug/kg/min via INTRAVENOUS
  Administered 2016-07-08: 80 ug/kg/min via INTRAVENOUS
  Administered 2016-07-08: 40 ug/kg/min via INTRAVENOUS
  Administered 2016-07-08: 60.037 ug/kg/min via INTRAVENOUS
  Administered 2016-07-08: 80 ug/kg/min via INTRAVENOUS
  Administered 2016-07-08: 60 ug/kg/min via INTRAVENOUS
  Administered 2016-07-08: 80 ug/kg/min via INTRAVENOUS
  Administered 2016-07-08: 60.037 ug/kg/min via INTRAVENOUS
  Administered 2016-07-08 – 2016-07-09 (×4): 60 ug/kg/min via INTRAVENOUS
  Administered 2016-07-09: 60.037 ug/kg/min via INTRAVENOUS
  Administered 2016-07-09 (×2): 70 ug/kg/min via INTRAVENOUS
  Administered 2016-07-09: 60.037 ug/kg/min via INTRAVENOUS
  Administered 2016-07-09: 70 ug/kg/min via INTRAVENOUS
  Administered 2016-07-09: 80 ug/kg/min via INTRAVENOUS
  Administered 2016-07-09 (×2): 70 ug/kg/min via INTRAVENOUS
  Administered 2016-07-09: 60.037 ug/kg/min via INTRAVENOUS
  Administered 2016-07-09 (×2): 80 ug/kg/min via INTRAVENOUS
  Administered 2016-07-09 (×2): 60.037 ug/kg/min via INTRAVENOUS
  Administered 2016-07-09: 80 ug/kg/min via INTRAVENOUS
  Administered 2016-07-09: 60 ug/kg/min via INTRAVENOUS
  Administered 2016-07-09: 70 ug/kg/min via INTRAVENOUS
  Filled 2016-07-06 (×23): qty 100

## 2016-07-06 MED ORDER — ROCURONIUM BROMIDE 50 MG/5ML IV SOSY
PREFILLED_SYRINGE | INTRAVENOUS | Status: AC
Start: 2016-07-06 — End: 2016-07-06
  Filled 2016-07-06: qty 5

## 2016-07-06 MED ORDER — ESMOLOL HCL 100 MG/10ML IV SOLN
INTRAVENOUS | Status: DC | PRN
Start: 2016-07-06 — End: 2016-07-06
  Administered 2016-07-06 (×2): 10 mg via INTRAVENOUS
  Administered 2016-07-06: 20 mg via INTRAVENOUS

## 2016-07-06 MED ORDER — NALOXONE HCL 0.4 MG/ML IJ SOLN
0.2000 mg | Freq: Once | INTRAMUSCULAR | Status: DC | PRN
Start: 2016-07-06 — End: 2016-07-06

## 2016-07-06 MED ORDER — ALBUMIN HUMAN 5 % IV SOLN
INTRAVENOUS | Status: DC | PRN
Start: 2016-07-06 — End: 2016-07-06
  Administered 2016-07-06: 17:00:00 via INTRAVENOUS

## 2016-07-06 MED ORDER — THROMBIN 5000 UNIT EX SOLR
CUTANEOUS | Status: DC | PRN
Start: 2016-07-06 — End: 2016-07-06
  Administered 2016-07-06 (×2): 10000 [IU] via TOPICAL

## 2016-07-06 MED ORDER — LABETALOL HCL 5 MG/ML IV SOLN
INTRAVENOUS | Status: AC
Start: 2016-07-06 — End: 2016-07-06
  Filled 2016-07-06: qty 5

## 2016-07-06 MED ORDER — FUROSEMIDE 10 MG/ML IJ SOLN
INTRAMUSCULAR | Status: DC | PRN
Start: 2016-07-06 — End: 2016-07-06
  Administered 2016-07-06 (×2): 20 mg via INTRAVENOUS

## 2016-07-06 MED ORDER — CEFAZOLIN SODIUM 1 GM IJ SOLR
INTRAMUSCULAR | Status: DC | PRN
Start: 2016-07-06 — End: 2016-07-06
  Administered 2016-07-06 (×4): 2000 mg via INTRAVENOUS

## 2016-07-06 MED ORDER — INSULIN REGULAR HUMAN 100 UNIT/ML IJ SOLN
INTRAMUSCULAR | Status: AC
Start: 2016-07-06 — End: 2016-07-06
  Filled 2016-07-06: qty 1

## 2016-07-06 MED ORDER — ASPIRIN EC LOW DOSE 81 MG OR TBEC
DELAYED_RELEASE_TABLET | ORAL | 0 refills | Status: DC
Start: 2016-04-25 — End: 2016-07-27

## 2016-07-06 MED ORDER — PROPOFOL 200 MG/20ML IV EMUL
INTRAVENOUS | Status: DC | PRN
Start: 2016-07-06 — End: 2016-07-06
  Administered 2016-07-06 (×4): 50 mg via INTRAVENOUS
  Administered 2016-07-06: 200 mg via INTRAVENOUS
  Administered 2016-07-06 (×5): 50 mg via INTRAVENOUS

## 2016-07-06 MED ORDER — LIDOCAINE HCL (CARDIAC) 20 MG/ML IV SOLN
INTRAVENOUS | Status: DC | PRN
Start: 2016-07-06 — End: 2016-07-06
  Administered 2016-07-06: 100 mg via INTRAVENOUS

## 2016-07-06 MED ORDER — CEFAZOLIN SODIUM-DEXTROSE 2-4 GM/100ML-% IV SOLN
2000.0000 mg | INTRAVENOUS | Status: DC
Start: 2016-07-06 — End: 2016-07-06
  Filled 2016-07-06: qty 2

## 2016-07-06 MED ORDER — SODIUM CHLORIDE 0.9 % IV SOLN
Freq: Once | INTRAVENOUS | Status: AC
Start: 2016-07-06 — End: 2016-07-07
  Administered 2016-07-06: 23:00:00 via INTRAVENOUS

## 2016-07-06 MED ORDER — VECURONIUM BROMIDE 10 MG/10ML IV SOSY
PREFILLED_SYRINGE | INTRAVENOUS | Status: DC | PRN
Start: 2016-07-06 — End: 2016-07-06
  Administered 2016-07-06 (×2): 10 mg via INTRAVENOUS

## 2016-07-06 MED ORDER — ONDANSETRON HCL 4 MG/2ML IV SOLN
4.0000 mg | Freq: Once | INTRAMUSCULAR | Status: DC | PRN
Start: 2016-07-06 — End: 2016-07-06

## 2016-07-06 MED ORDER — LIDOCAINE HCL 2 % EX GEL (UROJET) (~~LOC~~)
5.0000 mL | Freq: Once | CUTANEOUS | Status: AC
Start: 2016-07-06 — End: 2016-07-07
  Filled 2016-07-06: qty 5

## 2016-07-06 MED ORDER — CEFAZOLIN SODIUM 1 GM IJ SOLR
INTRAMUSCULAR | Status: AC
Start: 2016-07-06 — End: 2016-07-06
  Filled 2016-07-06: qty 2000

## 2016-07-06 MED ORDER — SODIUM CHLORIDE 0.9 % IV SOLN
INTRAVENOUS | Status: DC | PRN
Start: 2016-07-06 — End: 2016-07-06
  Administered 2016-07-06: 20:00:00 via INTRAVENOUS

## 2016-07-06 MED ORDER — FENTANYL CITRATE (PF) 100 MCG/2ML IJ SOLN
INTRAMUSCULAR | Status: AC
Start: 2016-07-06 — End: 2016-07-06
  Filled 2016-07-06: qty 4

## 2016-07-06 MED ORDER — FENTANYL 50 MCG/ML PREMIX INFUSION (~~LOC~~)
0.0000 ug/h | Status: DC
Start: 2016-07-06 — End: 2016-07-09
  Administered 2016-07-06 (×3): 25 ug/h via INTRAVENOUS
  Administered 2016-07-07: 21:00:00 125 ug/h via INTRAVENOUS
  Administered 2016-07-07: 100 ug/h via INTRAVENOUS
  Administered 2016-07-07: 150 ug/h via INTRAVENOUS
  Administered 2016-07-07: 25 ug/h via INTRAVENOUS
  Administered 2016-07-07 (×3): 150 ug/h via INTRAVENOUS
  Administered 2016-07-07: 125 ug/h via INTRAVENOUS
  Administered 2016-07-07: 75 ug/h via INTRAVENOUS
  Administered 2016-07-07 (×2): 125 ug/h via INTRAVENOUS
  Administered 2016-07-07: 150 ug/h via INTRAVENOUS
  Administered 2016-07-07: 50 ug/h via INTRAVENOUS
  Administered 2016-07-07 (×2): 150 ug/h via INTRAVENOUS
  Administered 2016-07-07: 125 ug/h via INTRAVENOUS
  Administered 2016-07-08 (×2): 275 ug/h via INTRAVENOUS
  Administered 2016-07-08: 225 ug/h via INTRAVENOUS
  Administered 2016-07-08: 275 ug/h via INTRAVENOUS
  Administered 2016-07-08: 150 ug/h via INTRAVENOUS
  Administered 2016-07-08: 175 ug/h via INTRAVENOUS
  Administered 2016-07-08: 200 ug/h via INTRAVENOUS
  Administered 2016-07-08 (×4): 275 ug/h via INTRAVENOUS
  Administered 2016-07-08: 250 ug/h via INTRAVENOUS
  Administered 2016-07-08 (×2): 275 ug/h via INTRAVENOUS
  Administered 2016-07-08 – 2016-07-09 (×2): 200 ug/h via INTRAVENOUS
  Administered 2016-07-09: 250 ug/h via INTRAVENOUS
  Administered 2016-07-09: 275 ug/h via INTRAVENOUS
  Administered 2016-07-09 (×2): 250 ug/h via INTRAVENOUS
  Administered 2016-07-09: 275 ug/h via INTRAVENOUS
  Administered 2016-07-09: 250 ug/h via INTRAVENOUS
  Administered 2016-07-09: 225 ug/h via INTRAVENOUS
  Administered 2016-07-09: 250 ug/h via INTRAVENOUS
  Administered 2016-07-09: 275 ug/h via INTRAVENOUS
  Administered 2016-07-09 (×2): 250 ug/h via INTRAVENOUS
  Filled 2016-07-06 (×3): qty 100

## 2016-07-06 MED ORDER — AMITRIPTYLINE HCL 25 MG OR TABS
25.00 mg | ORAL_TABLET | Freq: Every day | ORAL | 4 refills | Status: AC
Start: 2016-06-08 — End: ?

## 2016-07-06 MED ORDER — ROCURONIUM BROMIDE 50 MG/5ML IV SOSY
PREFILLED_SYRINGE | INTRAVENOUS | Status: DC | PRN
Start: 2016-07-06 — End: 2016-07-06
  Administered 2016-07-06: 30 mg via INTRAVENOUS
  Administered 2016-07-06 (×2): 20 mg via INTRAVENOUS
  Administered 2016-07-06: 30 mg via INTRAVENOUS
  Administered 2016-07-06 (×2): 50 mg via INTRAVENOUS

## 2016-07-06 MED ORDER — PROPOFOL 10 MG/ML BOLUS FROM BAG (~~LOC~~)
30.0000 mg | INTRAVENOUS | Status: DC | PRN
Start: 2016-07-06 — End: 2016-07-09
  Administered 2016-07-07 – 2016-07-09 (×10): 30 mg via INTRAVENOUS

## 2016-07-06 MED ORDER — METOPROLOL TARTRATE 5 MG/5ML IV SOLN
INTRAVENOUS | Status: AC
Start: 2016-07-06 — End: 2016-07-06
  Filled 2016-07-06: qty 5

## 2016-07-06 MED ORDER — DOCUSATE SODIUM 250 MG OR CAPS
250.0000 mg | ORAL_CAPSULE | Freq: Two times a day (BID) | ORAL | Status: DC
Start: 2016-07-06 — End: 2016-07-07

## 2016-07-06 MED ORDER — FENTANYL DOSE FROM BAG (~~LOC~~)
25.0000 ug | Status: DC | PRN
Start: 2016-07-06 — End: 2016-07-09
  Administered 2016-07-08 – 2016-07-09 (×5): 25 ug via INTRAVENOUS

## 2016-07-06 MED ORDER — LACTATED RINGERS IV SOLN
INTRAVENOUS | Status: DC
Start: 2016-07-06 — End: 2016-07-06
  Administered 2016-07-06: 10:00:00 via INTRAVENOUS

## 2016-07-06 MED ORDER — FENTANYL DOSE FROM BAG (~~LOC~~)
25.0000 ug | Status: DC | PRN
Start: 2016-07-06 — End: 2016-07-09
  Administered 2016-07-07 – 2016-07-08 (×7): 25 ug via INTRAVENOUS

## 2016-07-06 MED ORDER — HEPARIN (PORCINE) IN NACL 100-0.45 UNIT/ML-% IJ SOLN
INTRAMUSCULAR | Status: DC | PRN
Start: 2016-07-06 — End: 2016-07-06
  Administered 2016-07-06 (×2): 8.2 [IU]/kg/h via INTRAVENOUS

## 2016-07-06 MED ORDER — METOPROLOL TARTRATE 5 MG/5ML IV SOLN
INTRAVENOUS | Status: DC | PRN
Start: 2016-07-06 — End: 2016-07-06
  Administered 2016-07-06 (×2): 1 mg via INTRAVENOUS
  Administered 2016-07-06: 2 mg via INTRAVENOUS
  Administered 2016-07-06: 1 mg via INTRAVENOUS
  Administered 2016-07-06: 5 mg via INTRAVENOUS
  Administered 2016-07-06: 2 mg via INTRAVENOUS
  Administered 2016-07-06 (×3): 1 mg via INTRAVENOUS
  Administered 2016-07-06: 15:00:00 2 mg via INTRAVENOUS

## 2016-07-06 MED ORDER — PROPOFOL 10 MG/ML BOLUS FROM BAG (~~LOC~~)
30.0000 mg | INTRAVENOUS | Status: DC | PRN
Start: 2016-07-06 — End: 2016-07-09
  Administered 2016-07-08 – 2016-07-09 (×8): 30 mg via INTRAVENOUS

## 2016-07-06 MED ORDER — HYDROMORPHONE HCL 2 MG/ML IJ SOLN
0.2000 mg | INTRAMUSCULAR | Status: DC | PRN
Start: 2016-07-06 — End: 2016-07-06

## 2016-07-06 MED ORDER — OXYCODONE-ACETAMINOPHEN 5-325 MG OR TABS
ORAL_TABLET | ORAL | 0 refills | Status: DC
Start: 2016-04-15 — End: 2016-07-27

## 2016-07-06 MED ORDER — METOPROLOL TARTRATE 5 MG/5ML IV SOLN
5.0000 mg | INTRAVENOUS | Status: DC | PRN
Start: 2016-07-06 — End: 2016-07-06

## 2016-07-06 MED ORDER — DEXTROSE 50 % IV SOLN
INTRAVENOUS | Status: AC
Start: 2016-07-06 — End: 2016-07-06
  Filled 2016-07-06: qty 50

## 2016-07-06 MED ORDER — DEXTROSE 50 % IV SOLN
INTRAVENOUS | Status: DC | PRN
Start: 2016-07-06 — End: 2016-07-06
  Administered 2016-07-06 (×2): 25 g via INTRAVENOUS

## 2016-07-06 MED ORDER — LACTATED RINGERS IV SOLN
INTRAVENOUS | Status: DC | PRN
Start: 2016-07-06 — End: 2016-07-06
  Administered 2016-07-06: 10:00:00 via INTRAVENOUS

## 2016-07-06 MED ORDER — STERILE WATER FOR IRRIGATION IR SOLN
Status: DC | PRN
Start: 2016-07-06 — End: 2016-07-06
  Administered 2016-07-06 (×3): 1000 mL

## 2016-07-06 MED ORDER — MAGNESIUM SULFATE 4 GM/50ML IV SOLN
4.00 g | Freq: Once | INTRAVENOUS | Status: AC
Start: 2016-07-06 — End: 2016-07-07
  Administered 2016-07-06 (×2): 4 g via INTRAVENOUS
  Filled 2016-07-06: qty 50

## 2016-07-06 MED ORDER — HYDROMORPHONE HCL 2 MG/ML IJ SOLN
0.6000 mg | INTRAMUSCULAR | Status: DC | PRN
Start: 2016-07-06 — End: 2016-07-06

## 2016-07-06 MED ORDER — HYDRALAZINE HCL 20 MG/ML IJ SOLN
INTRAMUSCULAR | Status: DC | PRN
Start: 2016-07-06 — End: 2016-07-06
  Administered 2016-07-06 (×2): 10 mg via INTRAVENOUS

## 2016-07-06 MED ORDER — LIDOCAINE HCL 1 % IJ SOLN
INTRAMUSCULAR | Status: AC
Start: 2016-07-06 — End: 2016-07-06
  Filled 2016-07-06: qty 20

## 2016-07-06 MED ORDER — ONDANSETRON HCL 4 MG/2ML IV SOLN
4.0000 mg | Freq: Four times a day (QID) | INTRAMUSCULAR | Status: DC | PRN
Start: 2016-07-06 — End: 2016-07-27
  Administered 2016-07-07 – 2016-07-23 (×3): 4 mg via INTRAVENOUS
  Filled 2016-07-06 (×2): qty 2

## 2016-07-06 MED ORDER — PLASMA-LYTE A IV SOLN
INTRAVENOUS | Status: DC | PRN
Start: 2016-07-06 — End: 2016-07-06
  Administered 2016-07-06 (×3): via INTRAVENOUS

## 2016-07-06 MED ORDER — HYDROMORPHONE HCL 2 MG/ML IJ SOLN
INTRAMUSCULAR | Status: DC | PRN
Start: 2016-07-06 — End: 2016-07-06
  Administered 2016-07-06 (×3): 1 mg via INTRAVENOUS
  Administered 2016-07-06: .5 mg via INTRAVENOUS
  Administered 2016-07-06: 0.5 mg via INTRAVENOUS
  Administered 2016-07-06: .5 mg via INTRAVENOUS
  Administered 2016-07-06: 0.5 mg via INTRAVENOUS
  Administered 2016-07-06 (×3): .5 mg via INTRAVENOUS

## 2016-07-06 MED ORDER — MIDAZOLAM HCL 2 MG/2ML IJ SOLN
INTRAMUSCULAR | Status: DC | PRN
Start: 2016-07-06 — End: 2016-07-06
  Administered 2016-07-06 (×2): 2 mg via INTRAVENOUS

## 2016-07-06 MED ORDER — POVIDONE-IODINE 10 % EX SWAB
1.00 | Freq: Two times a day (BID) | CUTANEOUS | Status: AC
Start: 2016-07-06 — End: 2016-07-11
  Administered 2016-07-06 – 2016-07-11 (×10): 1 via TOPICAL

## 2016-07-06 MED ORDER — METOPROLOL TARTRATE 5 MG/5ML IV SOLN
INTRAVENOUS | Status: AC
Start: 2016-07-06 — End: 2016-07-06
  Filled 2016-07-06: qty 10

## 2016-07-06 MED ORDER — PROPOFOL 1000 MG/100ML IV EMUL
INTRAVENOUS | Status: AC
Start: 2016-07-06 — End: 2016-07-06
  Filled 2016-07-06: qty 100

## 2016-07-06 MED ORDER — SODIUM CHLORIDE 0.9 % IV SOLN
INTRAVENOUS | Status: DC | PRN
Start: 2016-07-06 — End: 2016-07-06
  Administered 2016-07-06 (×2)

## 2016-07-06 MED ORDER — MEPERIDINE HCL 25 MG/ML IJ SOLN
12.5000 mg | INTRAMUSCULAR | Status: DC | PRN
Start: 2016-07-06 — End: 2016-07-06

## 2016-07-06 MED ORDER — HYDROCODONE-ACETAMINOPHEN 7.5-325 MG OR TABS
1.00 | ORAL_TABLET | Freq: Three times a day (TID) | ORAL | 0 refills | Status: DC | PRN
Start: 2016-06-04 — End: 2016-07-27

## 2016-07-06 MED ORDER — PROPOFOL 1000 MG/100ML IV EMUL
INTRAVENOUS | Status: DC | PRN
Start: 2016-07-06 — End: 2016-07-06
  Administered 2016-07-06 (×2): 75 ug/kg/min via INTRAVENOUS

## 2016-07-06 MED ORDER — HEPARIN PER PHARMACY - ~~LOC~~
Status: DC
Start: 2016-07-06 — End: 2016-07-07

## 2016-07-06 MED ORDER — ALBUTEROL SULFATE 108 (90 BASE) MCG/ACT IN AERS
INHALATION_SPRAY | RESPIRATORY_TRACT | Status: AC
Start: 2016-07-06 — End: 2016-07-06
  Filled 2016-07-06: qty 6.7

## 2016-07-06 SURGICAL SUPPLY — 98 items
4X8" COVADERM DRESS (Dressings/packing) ×10 IMPLANT
BAG DECANTER (Misc Medical Supply) ×2 IMPLANT
BLADE INTUBRITE MAC 3 DISP (Misc Surgical Supply) ×2 IMPLANT
BLADE MICRO 15 DEG RND STK (Knives/Blades) ×2 IMPLANT
BLANKETAIRORUPPERBODY (Misc Medical Supply) ×2 IMPLANT
CATH - 5F 100CM ANGLED TAPER GLIDECATH (Procedural wires/sheaths/catheters/balloons/dilators) ×2 IMPLANT
CATH - 5F 65CM ANGLED TAPERED GLIDECATH (Procedural wires/sheaths/catheters/balloons/dilators) IMPLANT
CATH EMBOLECTOMY 3FR 40CM (Procedural wires/sheaths/catheters/balloons/dilators) ×2 IMPLANT
CATH PTA DIL BALLN 02 MM X 04 CM 150CM ULTRV014 (Procedural wires/sheaths/catheters/balloons/dilators) ×2 IMPLANT
CATH PTA DIL BALLN 03 MM X 04 CM 150CM ULTRV014 (Procedural wires/sheaths/catheters/balloons/dilators) ×2 IMPLANT
CATH PTA DIL BALLN 4MM X 60MM 130CMULTRV OTW (Procedural wires/sheaths/catheters/balloons/dilators) ×2 IMPLANT
CATH PTA DIL BALLN 5MM X 40MM 130CMULTRV OTW (Procedural wires/sheaths/catheters/balloons/dilators) ×2 IMPLANT
CATHETER OMNI FLUSH .038" 5FR X 65CM (Procedural wires/sheaths/catheters/balloons/dilators) ×2 IMPLANT
CLIP BULLDOG SOFTJAW DISP (External Fixator parts/clamps/rods) IMPLANT
CLIP SPRING RETR 6 MM STER DISP (Misc Surgical Supply) ×2 IMPLANT
COLLECTOR WASTE SPLASH STOP (Misc Surgical Supply) ×2 IMPLANT
CONT SPECIMEN OR WRAP 120ML (Misc Medical Supply) ×6 IMPLANT
COVER INTENSIFIER IMAGE 36 X 30" 0130 (Drapes/towels) ×2 IMPLANT
CXI CATH SUPPORT 4.0 FR .035X 135CM ANGLED COOK (Procedural wires/sheaths/catheters/balloons/dilators) ×2 IMPLANT
DERMABOND ADHESIVE ADVANCED 0.7ML (External Fixator parts/clamps/rods) ×4 IMPLANT
DEVICE - PROGLIDE PERCLOSE VASCULAR CLOSURE (Vascular Closure Device) ×2 IMPLANT
DEVICE - TORQUE DEVICE (Disp Instruments) ×2 IMPLANT
DRAPE C ARM 27" X 70" (Drapes/towels) ×2 IMPLANT
DRAPE HALF SHEET MEDIUM (Drapes/towels) ×2 IMPLANT
DRAPE ORTHO BAR SHEET 100 X 60" (Drapes/towels) ×4 IMPLANT
DRAPEIOBAN60X85CM6651EZ (Drapes/towels) ×2 IMPLANT
DRESSING MED. TEGADERM 4X4 (Dressings/packing) ×4 IMPLANT
DRESSING SPONGE 4X4 STER 8044 (Dressings/packing) ×4 IMPLANT
EPIC BLADE CLIPPER  CAREFUSION (Misc Surgical Supply) ×2 IMPLANT
EPIC CATH IV 18 GA X 1.16" ANGIOCATH (Misc Surgical Supply) IMPLANT
EPIC MANIFOLD 4 PORT NEPTUNE 2 F/ NUH (Misc Surgical Supply) ×2 IMPLANT
FLOWSWITCH3MCO. (Misc Surgical Supply) ×2 IMPLANT
GLOVE LIGHT HANDLE COVER 2/PK (Drapes/towels) ×2 IMPLANT
GLOVES BIOGEL 7.5 LTX (Gloves/gowns) ×12 IMPLANT
GOWN SURG X-LARGE (Gloves/gowns) ×12 IMPLANT
GUIDEWIRE CVD J .035" X 145 X 3 CM TSCF-35-145-3 (Procedural wires/sheaths/catheters/balloons/dilators) ×2 IMPLANT
GUIDEWIRE CVD J 3 MM TEFSG .035" X 145 CM BH (Procedural wires/sheaths/catheters/balloons/dilators) IMPLANT
GUIDEWIRE EXCH CVD J .035" X 260 X 1.5 CM THSCF-35-260-1.5-ROSEN (Procedural wires/sheaths/catheters/balloons/dilators) ×2 IMPLANT
GUIDEWIRE GLIDEWIRE ANGLED .035" X 150CM (Procedural wires/sheaths/catheters/balloons/dilators) ×4 IMPLANT
GUIDEWIRE HI-TORQUE 0.014X300CM STR (Procedural wires/sheaths/catheters/balloons/dilators) ×2 IMPLANT
GUIDEWIRE HI-TORQUE PILOT 0.014X300CM STR (Procedural wires/sheaths/catheters/balloons/dilators) ×2 IMPLANT
HANDLE MED/STANDARD DISP (Misc Surgical Supply) IMPLANT
HIGH PRESSURE TUBING ×2 IMPLANT
INSERT CLAMP FOGERTY 33 MM X 2 EA (Misc Surgical Supply) ×2 IMPLANT
INTRODUCER 6.0 FR X .018/.038" X 45 CM KCFW-6.0-18/38-45-RB-ANL2-HC (Procedural wires/sheaths/catheters/balloons/dilators) ×2 IMPLANT
KIT ANGIOTOUCH (Lines/Drains) ×2 IMPLANT
KIT CONTROL HAND ANGIOTOUCH 014644 (Kits/Sets/Trays) ×2 IMPLANT
KIT SYRINGE MULTIUSE 014612 (Kits/Sets/Trays) ×2 IMPLANT
KUMPE (Lap/Endo/Arthroscopy) ×2 IMPLANT
LOOPS VESSEL SILC MAXI BLUE 2 FTS 1001-78 (Misc Surgical Supply) ×2 IMPLANT
MANIFOLD SINGLE USE 014621 (Kits/Sets/Trays) ×2 IMPLANT
MERIT ANGIOPLASTY PACK (Misc Surgical Supply) ×2 IMPLANT
NEEDLE 22G X 1 1/2" REG BEVEL (Needles/punch/cannula/biopsy) IMPLANT
PACK BASIC I (Procedure Packs/kits) IMPLANT
PACK DRAPE CV SPLIT HEAVY (Drapes/towels) ×2 IMPLANT
PACK DRAPE ORTHO BAR (Drapes/towels) ×2 IMPLANT
PACK VASCULAR REV 0 (Procedure Packs/kits) ×2 IMPLANT
PAD GROUNDING VL ADULT REM (Cranial electrodes/grids) ×2 IMPLANT
PENCIL CAUT BOVIE VL (Cranial electrodes/grids) ×4 IMPLANT
PICO NPWT 15X20CM (S PUMP) (Procedure Packs/kits) ×2 IMPLANT
PRESTO INSUFFLATION DEVICE BARD PV (Disp Instruments) ×2 IMPLANT
SCALPEL - 15 BLADE (Knives/Blades) ×4 IMPLANT
SHEATH - 5F 10CM TERUMO PINNACLE W/O WIRE AND R/O MARKER (Procedural wires/sheaths/catheters/balloons/dilators) IMPLANT
SHEATH - 5F X 10CM TERUMO PINNACLE W/ WIRE (Procedural wires/sheaths/catheters/balloons/dilators) ×2 IMPLANT
SHEATH TUNNELER VASC & BULLET TIP BLUE LRG (Procedural wires/sheaths/catheters/balloons/dilators) IMPLANT
SHEATH TUNNELER VASC & BULLET TIP GRN SM (Procedural wires/sheaths/catheters/balloons/dilators) ×2 IMPLANT
SOL CHLORAPREP 26ML ORG (Misc Surgical Supply) ×6 IMPLANT
SPONGE LAP 12X12" DMT MT X-RAY STERI STRYKER (Dressings/packing) IMPLANT
SPONGE LAP 18X18" DMT MT X-RAY STERI STRYKER (Dressings/packing) ×14 IMPLANT
SPONGER RAYTEC 16-PLY 04X04" DMT MT X-RAY STERI STRYKER (Dressings/packing) IMPLANT
STOCKINETTE 6"X48" TUBULAR (Misc Medical Supply) IMPLANT
STOCKINETTE BIAS 6X4 YRD STER (Misc Medical Supply) IMPLANT
STOCKINETTE TUBULAR 6"X25 YD (Misc Medical Supply) IMPLANT
SURGIFOAM GELATIN COMPRSSN SPONGE USP STERI SZ 100C (8.5CMX12CMX2) (Dressings/packing) ×4 IMPLANT
SUTURE - 4.0 MONOCRYL (Suture) ×12 IMPLANT
SUTURE NYLON PS-2 4-0 18" 1667 (Suture) ×2 IMPLANT
SUTURE PERMA-HAND SILK 2-0 30" SH K833H (Suture) ×2 IMPLANT
SUTURE PERMA-HAND SILK 3-0 30" SH (Suture) ×2 IMPLANT
SUTURE PROLENE 6-0 30" C-1 (Suture) ×2 IMPLANT
SUTURE PROLENE BV-1 6-0 DA 30" 8709 (Suture) ×4 IMPLANT
SUTURE PROLENE BV-1 7-0 DA 30" 8703 (Suture) ×20 IMPLANT
SUTURE PROLENE C-1 5-0 DA 36" 8720 (Suture) ×8 IMPLANT
SUTURE PROLENE C-1 6-0 DA 30" 8706 (Suture) ×4 IMPLANT
SUTURE SILK SH 3-0 30" K832 (Suture) ×2 IMPLANT
SUTURE SILK TIES 3-0 10X30" SA84 (Suture) ×2 IMPLANT
SUTURE VICRYL+ CT-1 2-0 UND 27" VCP259 (Suture) ×16 IMPLANT
SUTURE VICRYL+ RB-1 4-0 UND 27" VCP214 (Suture) ×2 IMPLANT
SUTURE VICRYL+ SH 3-0 UND 27" VCP416 (Suture) ×14 IMPLANT
SYRINGE 10ML LUER LOCK "USA" TIP (Misc Medical Supply) ×10 IMPLANT
SYRINGE 20ML LUER LOCK TIP (Misc Medical Supply) ×4 IMPLANT
SYRINGE 3ML LUER LOCK TIP (Misc Medical Supply) ×4 IMPLANT
TAPE UMBILICAL 18" X 36" STER (Misc Medical Supply) ×2 IMPLANT
TIP CAUT BLADE 1" CTD INSULATED (Cranial electrodes/grids) ×4 IMPLANT
TOWELS, STERILE BLUE (Drapes/towels) ×8 IMPLANT
TRAY CATH FOL TEMP 14FR URNM (Kits/Sets/Trays) ×2 IMPLANT
URINMETER TRAY CATH FOLEY 14FR (Kits/Sets/Trays) IMPLANT
WIRE - 035 X 150CM TERUMO ANGLED GLIDEWIRE STANDARD (Procedural wires/sheaths/catheters/balloons/dilators) IMPLANT
WIRE - 035 X 260CM TERUMO ANGLED GLIDEWIRE STANDARD (Procedural wires/sheaths/catheters/balloons/dilators) ×2 IMPLANT

## 2016-07-06 NOTE — Addendum Note (Signed)
Addendum  created 07/06/16 2135 by Alfonso Pattenhoon, Taizoon, MD    Anesthesia Event edited

## 2016-07-06 NOTE — Interdisciplinary (Signed)
Received Pt from OR on LTV vent. RT Marylene Landngela brought pt up on OR vent orders of PC/AC WITN no complications at this time. CRNA Onalee Huaavid and OR MD order was to maintain Pt on current Ventilator orders of AC/PC RR14 PI 20 PEEP5 FIO2 50%. Pt has been tolerating current vent settings with no issues or complications noted. ET set up and currently running at 39.

## 2016-07-06 NOTE — Interdisciplinary (Signed)
Was called down to OR and to bring an ltv with me, brought ltv and placed bunny suit on, went into OR w ltv and waited till team was ready to bring pt up to sicu.  Anesth mds gave initial vent settings and titrated insp press on ltv based on pts tidal volumes.  Pt was then transported to sicu on ltv , no distress noted, and sats held during transport,

## 2016-07-06 NOTE — Anesthesia Postprocedure Evaluation (Signed)
Anesthesia Post Note    Patient: [name]    Procedure(s) Performed: Procedure(s):  LEFT POPLITEAL TO DISTAL POSTERIOR TIBIAL ARTERY BYPASS  LEFT SAPHENOUS VEIN HARVEST AORTOGRAM LEFT LEG ANGIOGRAM LEFT POPLITEAL ARTERY ANGIOPLASTY (4X4, 5X4) LEFT DISTAL POSTERIOR TIBIAL ARTERY ANGIOPLASTY          Final anesthesia type: General    Patient location: ICU    Post anesthesia pain: adequate analgesia    Mental status: sedated    Airway Patent: Yes, intubated     Last Vitals:    07/06/16  0953 07/06/16  2050 07/06/16  2110   BP: 142/85 119/64    Pulse: 111 112 105   Resp: 18 14 14    Temp: 37.2 C 37.1 C    SpO2: 96% 96% 98%     Post vital signs: stable    Hydration: adequate    N/V:no    Were two or more anti-emetics used?: No, patient remains intubated post-operatively    Disposal of controlled substances: All controlled substances during the case accounted for and disposed of per hospital policy    Plan of care per primary team.

## 2016-07-06 NOTE — H&P (Signed)
HISTORY & PHYSICAL - INTERVAL ASSESSMENT    **ONLY TO BE USED IN ADDITION TO A HISTORY & PHYSICAL**    Jerome QuarryBrandon Hunt  [id number]      This interval assessment is required for History & Physical completed less than 30 days prior to the admission or surgery. A History & Physical completed more than 30 days prior to the admission or surgery must be repeated.    Current Medical Status:  Unchanged    Medications / Allergies:  Unchanged    Review of Systems:  Unchanged    Physical Examination:  I have examined the patient today.  Unchanged    Laboratory or Clinical Data:  Unchanged    Modifications of Initial Care Plan:  Unchanged        Myra RudeMatthew David [facility]     07/06/16     9:23 AM

## 2016-07-06 NOTE — Brief Op Note (Signed)
BRIEF OPERATIVE NOTE    DATE: 07/06/2016  TIME: 7:29 PM    PREOPERATIVE DIAGNOSIS:   Lower extremity peripheral vascular disease   Claudication    POSTOPERATIVE DIAGNOSIS:  Lower extremity peripheral vascular disease   Claudication    PROCEDURE INFORMATION:  Procedure(s):  LEFT POPLITEAL TO DISTAL POSTERIOR TIBIAL ARTERY BYPASS  LEFT SAPHENOUS VEIN HARVEST AORTOGRAM LEFT LEG ANGIOGRAM LEFT POPLITEAL ARTERY ANGIOPLASTY (4X4, 5X4) LEFT DISTAL POSTERIOR TIBIAL ARTERY ANGIOPLASTY     - Wound Class: Class I (Clean) - Incision Closure: Deep and Superficial Layers    ATTENDING SURGEON:   Surgeon(s) and Role:     * Dalene CarrowKabutey, Nii-Kabu, MD - Primary     * Nelma RothmanWhealon, Saranya Harlin David, MD - Resident - Assisting    ANESTHESIA: General    FINDINGS: successful bypass from posterior tibial artery bypass. Poor outflow requiring balloon angioplasty of distal anastomosis and runoff vessel.   Balloon angioplasty of proximal anastomosis     Patient developed acidosis during the operating and was tachycardic.  The decision was made at the end of the case to keep him intubated and transfer to the ICU for ongoing management and resuscitation.     Ultraverse 2x3440mm  Ultraverse 4x1360mm  Ultraverse 5x1140mm  Ultraverse 3x7140mm    SPECIMENS: none    Fluids/Blood Products:      IV Fluids: 3L crystalloid, 500 albumin    Blood Products: none    EBL: 75cc    Urine Output: 1.5L     COMPLICATIONS: none    DISPOSITION: ICU    Contrast 120cc  Fluoro 253  DAP [id number]  AK 508
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
## 2016-07-07 ENCOUNTER — Inpatient Hospital Stay (HOSPITAL_COMMUNITY): Payer: No Typology Code available for payment source | Admitting: Anesthesiology

## 2016-07-07 ENCOUNTER — Inpatient Hospital Stay: Payer: No Typology Code available for payment source

## 2016-07-07 ENCOUNTER — Encounter
Admission: RE | Disposition: A | Discharge status: Home Health | Payer: Self-pay | Attending: Vascular & Interventional Radiology

## 2016-07-07 ENCOUNTER — Inpatient Hospital Stay: Payer: No Typology Code available for payment source | Admitting: Anesthesiology

## 2016-07-07 DIAGNOSIS — I739 Peripheral vascular disease, unspecified: Secondary | ICD-10-CM

## 2016-07-07 DIAGNOSIS — T82868A Thrombosis of vascular prosthetic devices, implants and grafts, initial encounter: Principal | ICD-10-CM

## 2016-07-07 DIAGNOSIS — I70212 Atherosclerosis of native arteries of extremities with intermittent claudication, left leg: Secondary | ICD-10-CM

## 2016-07-07 DIAGNOSIS — Z4889 Encounter for other specified surgical aftercare: Secondary | ICD-10-CM

## 2016-07-07 LAB — URINALYSIS WITH CULTURE REFLEX, WHEN INDICATED
Bilirubin, UA: NEGATIVE
Glucose, UA: NEGATIVE MG/DL
Hemoglobin, UA: NEGATIVE
Ketones, UA: NEGATIVE MG/DL
Leukocyte Esterase, UA: NEGATIVE
Nitrite, UA: NEGATIVE
Protein, UA: NEGATIVE MG/DL
RBC, UA: <1 #/HPF (ref 0–3)
Specific Grav, UA: 1.011 (ref 1.003–1.030)
UA Cult: NEGATIVE
Urobilinogen, UA: <2 MG/DL (ref <2.0)
WBC, UA: 3 #/HPF (ref 0–5)
pH, UA: 6 (ref 5.0–8.0)

## 2016-07-07 LAB — LACTATE, BLOOD
Lactic Acid: 4.2 mmol/L — ABNORMAL HIGH (ref 0.5–2.2)
Lactic Acid: 2.8 mmol/L — ABNORMAL HIGH (ref 0.5–2.2)
Lactic Acid: 2.8 mmol/L — ABNORMAL HIGH (ref 0.5–2.2)

## 2016-07-07 LAB — BLOOD GAS + ELECTROLYTES (ISTAT)
Base Excess (iSTAT): NEGATIVE MMOL/L
Base Excess (iSTAT): NEGATIVE MMOL/L
Base Excess (iSTAT): 1 MMOL/L
Base Excess (iSTAT): 0 MMOL/L
Base Excess (iSTAT): 1 MMOL/L
Calcium Ionized (iSTAT): 1.04 mmol/L — ABNORMAL LOW (ref 1.13–1.32)
Calcium Ionized (iSTAT): 1.08 mmol/L — ABNORMAL LOW (ref 1.13–1.32)
Calcium Ionized (iSTAT): 1.1 mmol/L — ABNORMAL LOW (ref 1.13–1.32)
Calcium Ionized (iSTAT): 1.07 mmol/L — ABNORMAL LOW (ref 1.13–1.32)
Calcium Ionized (iSTAT): 1.12 mmol/L — ABNORMAL LOW (ref 1.13–1.32)
Glucose (iSTAT): 175 MG/DL — ABNORMAL HIGH (ref 70–110)
Glucose (iSTAT): 326 MG/DL — ABNORMAL HIGH (ref 70–110)
Glucose (iSTAT): 117 MG/DL — ABNORMAL HIGH (ref 70–110)
Glucose (iSTAT): 127 MG/DL — ABNORMAL HIGH (ref 70–110)
Glucose (iSTAT): 140 MG/DL — ABNORMAL HIGH (ref 70–110)
HCO3 (iSTAT): 21 MMOL/L (ref 21–27)
HCO3 (iSTAT): 22 MMOL/L (ref 21–27)
HCO3 (iSTAT): 26 MMOL/L (ref 21–27)
HCO3 (iSTAT): 25 MMOL/L (ref 21–27)
HCO3 (iSTAT): 25 MMOL/L (ref 21–27)
K, (iSTAT): 5.3 MMOL/L (ref 3.7–5.5)
K, (iSTAT): 5.8 MMOL/L — ABNORMAL HIGH (ref 3.7–5.5)
K, (iSTAT): 4.7 MMOL/L (ref 3.7–5.5)
K, (iSTAT): 4.4 MMOL/L (ref 3.7–5.5)
K, (iSTAT): 4.8 MMOL/L (ref 3.7–5.5)
O2 Sat (iSTAT): 100 % — ABNORMAL HIGH (ref 95–98)
O2 Sat (iSTAT): 100 % — ABNORMAL HIGH (ref 95–98)
O2 Sat (iSTAT): 99 % — ABNORMAL HIGH (ref 95–98)
O2 Sat (iSTAT): 98 % (ref 95–98)
O2 Sat (iSTAT): 99 % — ABNORMAL HIGH (ref 95–98)
PCO2 (iSTAT): 55 MMHG — ABNORMAL HIGH (ref 36–42)
PCO2 (iSTAT): 48 MMHG — ABNORMAL HIGH (ref 36–42)
PCO2 (iSTAT): 39 MMHG (ref 36–42)
PCO2 (iSTAT): 38 MMHG (ref 36–42)
PCO2 (iSTAT): 36 MMHG (ref 36–42)
PO2 (iSTAT): 274 MMHG — ABNORMAL HIGH (ref 80–104)
PO2 (iSTAT): 413 MMHG — ABNORMAL HIGH (ref 80–104)
PO2 (iSTAT): 150 MMHG — ABNORMAL HIGH (ref 80–104)
PO2 (iSTAT): 107 MMHG — ABNORMAL HIGH (ref 80–104)
PO2 (iSTAT): 125 MMHG — ABNORMAL HIGH (ref 80–104)
Sodium (iSTAT): 138 MMOL/L (ref 138–146)
Sodium (iSTAT): 134 MMOL/L — ABNORMAL LOW (ref 138–146)
Sodium (iSTAT): 142 MMOL/L (ref 138–146)
Sodium (iSTAT): 143 MMOL/L (ref 138–146)
Sodium (iSTAT): 142 MMOL/L (ref 138–146)
Total CO2 (iSTAT): 23 MMOL/L (ref 23–29)
Total CO2 (iSTAT): 24 MMOL/L (ref 23–29)
Total CO2 (iSTAT): 27 MMOL/L (ref 23–29)
Total CO2 (iSTAT): 26 MMOL/L (ref 23–29)
Total CO2 (iSTAT): 26 MMOL/L (ref 23–29)
pH (iSTAT): 7.2 — ABNORMAL LOW (ref 7.38–7.42)
pH (iSTAT): 7.27 — ABNORMAL LOW (ref 7.38–7.42)
pH (iSTAT): 7.42 (ref 7.38–7.42)
pH (iSTAT): 7.42 (ref 7.38–7.42)
pH (iSTAT): 7.45 — ABNORMAL HIGH (ref 7.38–7.42)

## 2016-07-07 LAB — PROTHROMBIN TIME, BLOOD
INR: 1.08 (ref 0.89–1.12)
Prothrombin Time: 14 s (ref 12.1–14.4)

## 2016-07-07 LAB — CBC WITH DIFF, BLOOD
ANC automated: 10.5 10*3/uL — ABNORMAL HIGH (ref 2.0–8.1)
Basophils %: 0.2 %
Basophils Absolute: 0 10*3/uL (ref 0.0–0.2)
Eosinophils %: 0 %
Eosinophils Absolute: 0 10*3/uL (ref 0.0–0.5)
Hematocrit: 30.7 % — ABNORMAL LOW (ref 39.5–50.0)
Hgb: 10.6 G/DL — ABNORMAL LOW (ref 13.5–16.9)
Lymphocytes %: 13.3 %
Lymphocytes Absolute: 1.7 10*3/uL (ref 0.9–3.3)
MCH: 32.6 PG (ref 27.0–33.5)
MCHC: 34.6 G/DL (ref 32.0–35.5)
MCV: 94.3 FL (ref 81.5–97.0)
MPV: 10 FL (ref 7.2–11.7)
Monocytes %: 6.3 %
Monocytes Absolute: 0.8 10*3/uL (ref 0.0–0.8)
Neutrophils % (A): 80.2 %
PLT Count: 222 10*3/uL (ref 150–400)
RBC: 3.26 10*6/uL — ABNORMAL LOW (ref 3.70–5.00)
RDW-CV: 15.1 % — ABNORMAL HIGH (ref 11.6–14.4)
White Bld Cell Count: 13.1 10*3/uL — ABNORMAL HIGH (ref 4.0–10.5)

## 2016-07-07 LAB — BASIC METABOLIC PANEL, BLOOD
BUN: 8 mg/dL (ref 7–25)
BUN: 8 mg/dL (ref 7–25)
CO2: 22 mmol/L (ref 21–31)
CO2: 26 mmol/L (ref 21–31)
Calcium: 8.8 mg/dL (ref 8.6–10.3)
Calcium: 8.4 mg/dL — ABNORMAL LOW (ref 8.6–10.3)
Chloride: 106 mmol/L (ref 98–107)
Chloride: 108 mmol/L — ABNORMAL HIGH (ref 98–107)
Creat: 0.9 mg/dL (ref 0.7–1.3)
Creat: 0.8 mg/dL (ref 0.7–1.3)
Electrolyte Balance: 10 mmol/L (ref 2–12)
Electrolyte Balance: 7 mmol/L (ref 2–12)
Glucose: 147 mg/dL — ABNORMAL HIGH (ref 70–115)
Glucose: 142 mg/dL — ABNORMAL HIGH (ref 70–115)
Potassium: 4.9 mmol/L (ref 3.5–5.1)
Potassium: 4.7 mmol/L (ref 3.5–5.1)
Sodium: 138 mmol/L (ref 136–145)
Sodium: 141 mmol/L (ref 136–145)
eGFR - high estimate: >60 (ref >59)
eGFR - high estimate: >60 (ref >59)
eGFR - low estimate: >60 (ref >59)
eGFR - low estimate: >60 (ref >59)

## 2016-07-07 LAB — ARTERIAL BLOOD GAS
Base Excess: NEGATIVE mmol/L
Base Excess: 2.9 mmol/L
HCO3: 23.4 mmol/L (ref 21.0–27.0)
HCO3: 25.8 mmol/L (ref 21.0–27.0)
Inspired O2: 0.4
Inspired O2: 0.4
O2 Sat: 99.3 % — ABNORMAL HIGH (ref 95.0–99.0)
Patient Temp: 37.2 DEG C
Patient Temp: 37.1 DEG C
pCO2 (Temp Adjusted): 35.9 MMHG — ABNORMAL LOW (ref 36.0–42.0)
pCO2 (Temp Adjusted): 34.8 MMHG — ABNORMAL LOW (ref 36.0–42.0)
pH (Temp Adjusted): 7.43 — ABNORMAL HIGH (ref 7.38–7.42)
pH (Temp Adjusted): 7.49 — ABNORMAL HIGH (ref 7.38–7.42)
pO2 (Temp Adjusted): 144.2 MMHG — ABNORMAL HIGH (ref 80.0–104.0)
pO2 (Temp Adjusted): 173.3 MMHG — ABNORMAL HIGH (ref 80.0–104.0)

## 2016-07-07 LAB — ACT K (ISTAT) POINT OF CARE TESTING
ACT K (iSTAT) POC: 164 SEC. — ABNORMAL HIGH (ref 74–137)
ACT K (iSTAT) POC: 164 SEC. — ABNORMAL HIGH (ref 74–137)
ACT K (iSTAT) POC: 219 SEC. — ABNORMAL HIGH (ref 74–137)
ACT K (iSTAT) POC: 246 SEC. — ABNORMAL HIGH (ref 74–137)
ACT K (iSTAT) POC: 235 SEC. — ABNORMAL HIGH (ref 74–137)
ACT K (iSTAT) POC: 230 SEC. — ABNORMAL HIGH (ref 74–137)
ACT K (iSTAT) POC: 224 SEC. — ABNORMAL HIGH (ref 74–137)
ACT K (iSTAT) POC: 230 SEC. — ABNORMAL HIGH (ref 74–137)
ACT K (iSTAT) POC: 219 SEC. — ABNORMAL HIGH (ref 74–137)

## 2016-07-07 LAB — DRUG SCREEN RAPID PANEL 12 NO CONFIRMATION, URINE
Amphetamines, Urine: NOT DETECTED
Barbiturates: NOT DETECTED
Benzodiazepines: POSITIVE — AB
Cocaine: NOT DETECTED
MDMA: NOT DETECTED
Methadone: NOT DETECTED
Opiates: POSITIVE — AB
PCP: NOT DETECTED
Propoxyphene: NOT DETECTED
THC: POSITIVE — AB

## 2016-07-07 LAB — MAGNESIUM, BLOOD
Magnesium: 2.4 mg/dL (ref 1.9–2.7)
Magnesium: 2.1 mg/dL (ref 1.9–2.7)

## 2016-07-07 LAB — HEMOGLOBIN, HEMOCUE POINT OF CARE TESTING
Hem, Hemocue POC: 13.4 G/DL — ABNORMAL LOW (ref 13.5–16.9)
Hem, Hemocue POC: 13.1 G/DL — ABNORMAL LOW (ref 13.5–16.9)
Hem, Hemocue POC: 13.2 G/DL — ABNORMAL LOW (ref 13.5–16.9)
Hem, Hemocue POC: 13.3 G/DL — ABNORMAL LOW (ref 13.5–16.9)
Hem, Hemocue POC: 12.7 G/DL — ABNORMAL LOW (ref 13.5–16.9)
Hem, Hemocue POC: 12.9 G/DL — ABNORMAL LOW (ref 13.5–16.9)
Hem, Hemocue POC: 10.4 G/DL — ABNORMAL LOW (ref 13.5–16.9)
Hem, Hemocue POC: 10.9 G/DL — ABNORMAL LOW (ref 13.5–16.9)
Hem, Hemocue POC: 10.7 G/DL — ABNORMAL LOW (ref 13.5–16.9)
Hem, Hemocue POC: 10.9 G/DL — ABNORMAL LOW (ref 13.5–16.9)

## 2016-07-07 LAB — PHOSPHORUS, BLOOD
Phosphorus: 2.1 MG/DL — ABNORMAL LOW (ref 2.5–5.0)
Phosphorus: 2.9 MG/DL (ref 2.5–5.0)

## 2016-07-07 LAB — CPK-CREATINE PHOSPHOKINASE, BLOOD
CK: 1047 U/L — ABNORMAL HIGH (ref 30–223)
CK: 1233 U/L — ABNORMAL HIGH (ref 30–223)

## 2016-07-07 LAB — APTT, BLOOD
PTT: 24.9 s (ref 24.1–36.3)
PTT: 43.9 s — ABNORMAL HIGH (ref 24.1–36.3)

## 2016-07-07 SURGERY — CREATION, BYPASS, ARTERIAL, POPLITEAL TO TIBIAL, USING GRAFT
Anesthesia: General | Site: Leg Upper | Laterality: Right | Wound class: Class I (Clean)

## 2016-07-07 MED ORDER — DEXAMETHASONE SODIUM PHOSPHATE 4 MG/ML IJ SOLN (CUSTOM)
INTRAMUSCULAR | Status: DC | PRN
Start: 2016-07-07 — End: 2016-07-08
  Administered 2016-07-07 (×2): 4 mg via INTRAVENOUS

## 2016-07-07 MED ORDER — MIDAZOLAM HCL 2 MG/2ML IJ SOLN
INTRAMUSCULAR | Status: DC | PRN
Start: 2016-07-07 — End: 2016-07-08
  Administered 2016-07-07 (×5): 2 mg via INTRAVENOUS

## 2016-07-07 MED ORDER — FENTANYL CITRATE (PF) 100 MCG/2ML IJ SOLN
INTRAMUSCULAR | Status: AC
Start: 2016-07-07 — End: 2016-07-07
  Filled 2016-07-07: qty 2

## 2016-07-07 MED ORDER — MIDAZOLAM HCL 2 MG/2ML IJ SOLN
INTRAMUSCULAR | Status: AC
Start: 2016-07-07 — End: 2016-07-07
  Filled 2016-07-07: qty 2

## 2016-07-07 MED ORDER — CEFAZOLIN SODIUM-DEXTROSE 2-4 GM/100ML-% IV SOLN
INTRAVENOUS | Status: AC
Start: 2016-07-07 — End: 2016-07-07
  Filled 2016-07-07: qty 2

## 2016-07-07 MED ORDER — HEPARIN SODIUM (PORCINE) 1000 UNIT/ML IJ SOLN
8000.00 [IU] | Freq: Once | INTRAMUSCULAR | Status: AC
Start: 2016-07-07 — End: 2016-07-07
  Administered 2016-07-07 (×2): 8000 [IU] via INTRAVENOUS
  Filled 2016-07-07: qty 8

## 2016-07-07 MED ORDER — INFLUENZA VAC SPLIT QUAD 0.5 ML IM SUSY
0.50 mL | PREFILLED_SYRINGE | INTRAMUSCULAR | Status: AC
Start: 2016-07-07 — End: 2016-07-27
  Administered 2016-07-27 (×2): 0.5 mL via INTRAMUSCULAR
  Filled 2016-07-07: qty 0.5

## 2016-07-07 MED ORDER — GABAPENTIN 300 MG OR CAPS
300.0000 mg | ORAL_CAPSULE | Freq: Three times a day (TID) | ORAL | Status: DC
Start: 2016-07-08 — End: 2016-07-10
  Administered 2016-07-08 – 2016-07-10 (×5): 300 mg via ORAL
  Filled 2016-07-07 (×4): qty 1

## 2016-07-07 MED ORDER — KETAMINE HCL 100 MG/ML IJ SOLN
INTRAMUSCULAR | Status: DC | PRN
Start: 2016-07-07 — End: 2016-07-08
  Administered 2016-07-07 (×3): 50 mg via INTRAVENOUS

## 2016-07-07 MED ORDER — HEPARIN SODIUM (PORCINE) 5000 UNIT/ML IJ SOLN
INTRAMUSCULAR | Status: DC | PRN
Start: 2016-07-07 — End: 2016-07-08
  Administered 2016-07-07: 3000 [IU] via INTRAVENOUS
  Administered 2016-07-07: 2000 [IU] via INTRAVENOUS
  Administered 2016-07-07: 5000 [IU] via INTRAVENOUS
  Administered 2016-07-07: 2500 [IU] via INTRAVENOUS
  Administered 2016-07-07: 14:00:00 5000 [IU] via INTRAVENOUS

## 2016-07-07 MED ORDER — THROMBIN 5000 UNIT EX SOLR
CUTANEOUS | Status: DC | PRN
Start: 2016-07-07 — End: 2016-07-07
  Administered 2016-07-07 (×2): 10000 [IU]

## 2016-07-07 MED ORDER — FENTANYL CITRATE (PF) 100 MCG/2ML IJ SOLN
INTRAMUSCULAR | Status: DC | PRN
Start: 2016-07-07 — End: 2016-07-08
  Administered 2016-07-07: 50 ug via INTRAVENOUS
  Administered 2016-07-07: 19:00:00 25 ug via INTRAVENOUS
  Administered 2016-07-07: 50 ug via INTRAVENOUS
  Administered 2016-07-07: 25 ug via INTRAVENOUS
  Administered 2016-07-07 (×2): 50 ug via INTRAVENOUS
  Administered 2016-07-07: 25 ug via INTRAVENOUS
  Administered 2016-07-07 (×3): 50 ug via INTRAVENOUS
  Administered 2016-07-07: 100 ug via INTRAVENOUS

## 2016-07-07 MED ORDER — CEFAZOLIN SODIUM 1 GM IJ SOLR
INTRAMUSCULAR | Status: AC
Start: 2016-07-07 — End: 2016-07-07
  Filled 2016-07-07: qty 2000

## 2016-07-07 MED ORDER — HEPARIN 25,000 UNITS/250 ML 1/2 NS PREMIX INFUSION (~~LOC~~)
1400.0000 [IU]/h | INTRAMUSCULAR | Status: DC
Start: 2016-07-07 — End: 2016-07-07
  Administered 2016-07-07 (×4): 1000 [IU]/h via INTRAVENOUS
  Administered 2016-07-07: 1400 [IU]/h via INTRAVENOUS
  Administered 2016-07-07 (×2): 1000 [IU]/h via INTRAVENOUS
  Administered 2016-07-07: 1400 [IU]/h via INTRAVENOUS
  Administered 2016-07-07: 1000 [IU]/h via INTRAVENOUS
  Filled 2016-07-07: qty 250

## 2016-07-07 MED ORDER — SODIUM CHLORIDE 0.9 % IV BOLUS (~~LOC~~)
1000.00 mL | INJECTION | Freq: Once | INTRAVENOUS | Status: AC
Start: 2016-07-07 — End: 2016-07-07
  Administered 2016-07-07 (×2): 1000 mL via INTRAVENOUS

## 2016-07-07 MED ORDER — PROPOFOL 1000 MG/100ML IV EMUL
INTRAVENOUS | Status: DC | PRN
Start: 2016-07-07 — End: 2016-07-08
  Administered 2016-07-07 (×2): 50 ug/kg/min via INTRAVENOUS

## 2016-07-07 MED ORDER — PLASMA-LYTE A IV SOLN
125.0000 mL/h | INTRAVENOUS | Status: DC
Start: 2016-07-07 — End: 2016-07-09

## 2016-07-07 MED ORDER — HEPARIN (PORCINE) IN NACL 100-0.45 UNIT/ML-% IJ SOLN
INTRAMUSCULAR | Status: DC | PRN
Start: 2016-07-07 — End: 2016-07-08
  Administered 2016-07-07 (×2): 14 [IU]/kg/h via INTRAVENOUS

## 2016-07-07 MED ORDER — CISATRACURIUM BESYLATE 20 MG/10ML IV SOLN
INTRAVENOUS | Status: DC | PRN
Start: 2016-07-07 — End: 2016-07-08
  Administered 2016-07-07: 5 mg via INTRAVENOUS
  Administered 2016-07-07: 4 mg via INTRAVENOUS
  Administered 2016-07-07 (×2): 10 mg via INTRAVENOUS

## 2016-07-07 MED ORDER — FENTANYL CITRATE (PF) 100 MCG/2ML IJ SOLN
INTRAMUSCULAR | Status: AC
Start: 2016-07-07 — End: 2016-07-07
  Filled 2016-07-07: qty 4

## 2016-07-07 MED ORDER — THROMBIN 5000 UNIT EX SOLR
CUTANEOUS | Status: AC
Start: 2016-07-07 — End: 2016-07-07
  Filled 2016-07-07: qty 4

## 2016-07-07 MED ORDER — FAMOTIDINE IN NACL 20 MG/50ML IV SOLN
20.0000 mg | Freq: Every day | INTRAVENOUS | Status: DC
Start: 2016-07-07 — End: 2016-07-07

## 2016-07-07 MED ORDER — SODIUM CHLORIDE 0.9 % IV SOLN
INTRAVENOUS | Status: DC | PRN
Start: 2016-07-07 — End: 2016-07-07
  Administered 2016-07-07: 19:00:00

## 2016-07-07 MED ORDER — CEFAZOLIN SODIUM 1 GM IJ SOLR
INTRAMUSCULAR | Status: DC | PRN
Start: 2016-07-07 — End: 2016-07-08
  Administered 2016-07-07 (×4): 2000 mg via INTRAVENOUS

## 2016-07-07 MED ORDER — PROPOFOL 10 MG/ML BOLUS FROM BAG (~~LOC~~)
30.0000 mg | INTRAVENOUS | Status: DC | PRN
Start: 2016-07-07 — End: 2016-07-07

## 2016-07-07 MED ORDER — SODIUM CHLORIDE 0.9 % IV SOLN
Freq: Once | INTRAVENOUS | Status: AC
Start: 2016-07-07 — End: 2016-07-07
  Administered 2016-07-07: 04:00:00 via INTRAVENOUS

## 2016-07-07 MED ORDER — HEPARIN SODIUM (PORCINE) 5000 UNIT/ML IJ SOLN
INTRAMUSCULAR | Status: AC
Start: 2016-07-07 — End: 2016-07-07
  Filled 2016-07-07: qty 1

## 2016-07-07 MED ORDER — DOCUSATE SODIUM 50 MG/5ML OR LIQD
250.0000 mg | Freq: Every day | ORAL | Status: DC
Start: 2016-07-07 — End: 2016-07-12
  Administered 2016-07-08 – 2016-07-11 (×5): 250 mg via NASOGASTRIC
  Filled 2016-07-07 (×4): qty 25

## 2016-07-07 MED ORDER — ONDANSETRON HCL 4 MG/2ML IV SOLN
INTRAMUSCULAR | Status: DC | PRN
Start: 2016-07-07 — End: 2016-07-08
  Administered 2016-07-07 (×2): 4 mg via INTRAVENOUS

## 2016-07-07 MED ORDER — FENTANYL 50 MCG/ML PREMIX INFUSION (~~LOC~~)
0.0000 ug/h | Status: DC
Start: 2016-07-07 — End: 2016-07-07

## 2016-07-07 MED ORDER — IODIXANOL 320 MG/ML IV SOLN
INTRAVENOUS | Status: AC
Start: 2016-07-07 — End: 2016-07-07
  Filled 2016-07-07: qty 150

## 2016-07-07 MED ORDER — LABETALOL HCL 5 MG/ML IV SOLN
INTRAVENOUS | Status: DC | PRN
Start: 2016-07-07 — End: 2016-07-08
  Administered 2016-07-07: 14:00:00 5 mg via INTRAVENOUS
  Administered 2016-07-07: 10 mg via INTRAVENOUS
  Administered 2016-07-07 (×3): 5 mg via INTRAVENOUS

## 2016-07-07 MED ORDER — PROPOFOL 200 MG/20ML IV EMUL
INTRAVENOUS | Status: AC
Start: 2016-07-07 — End: 2016-07-07
  Filled 2016-07-07: qty 20

## 2016-07-07 MED ORDER — KETAMINE HCL-SODIUM CHLORIDE 50-0.9 MG/5ML-% IV SOSY
PREFILLED_SYRINGE | INTRAVENOUS | Status: AC
Start: 2016-07-07 — End: 2016-07-07
  Filled 2016-07-07: qty 5

## 2016-07-07 MED ORDER — CHLORHEXIDINE GLUCONATE 0.12 % MT SOLN
15.0000 mL | Freq: Two times a day (BID) | OROMUCOSAL | Status: DC
Start: 2016-07-07 — End: 2016-07-07
  Filled 2016-07-07: qty 15

## 2016-07-07 MED ORDER — ASPIRIN 81 MG OR CHEW
81.0000 mg | CHEWABLE_TABLET | Freq: Every day | ORAL | Status: DC
Start: 2016-07-08 — End: 2016-07-27
  Administered 2016-07-08 – 2016-07-27 (×19): 81 mg via ORAL
  Filled 2016-07-07 (×19): qty 1

## 2016-07-07 MED ORDER — PROPOFOL 1000 MG/100ML IV EMUL
INTRAVENOUS | Status: AC
Start: 2016-07-07 — End: 2016-07-07
  Filled 2016-07-07: qty 100

## 2016-07-07 MED ORDER — FENTANYL DOSE FROM BAG (~~LOC~~)
25.0000 ug | Status: DC | PRN
Start: 2016-07-07 — End: 2016-07-07

## 2016-07-07 MED ORDER — LIDOCAINE HCL 1 % IJ SOLN
INTRAMUSCULAR | Status: AC
Start: 2016-07-07 — End: 2016-07-07
  Filled 2016-07-07: qty 20

## 2016-07-07 MED ORDER — HEPARIN SODIUM (PORCINE) 5000 UNIT/ML IJ SOLN
INTRAMUSCULAR | Status: DC | PRN
Start: 2016-07-07 — End: 2016-07-07

## 2016-07-07 MED ORDER — VECURONIUM BROMIDE 10 MG/10ML IV SOSY
PREFILLED_SYRINGE | INTRAVENOUS | Status: DC | PRN
Start: 2016-07-07 — End: 2016-07-08
  Administered 2016-07-07 (×4): 10 mg via INTRAVENOUS

## 2016-07-07 MED ORDER — HEPARIN PER PHARMACY - ~~LOC~~
Status: DC
Start: 2016-07-07 — End: 2016-07-07

## 2016-07-07 MED ORDER — CEFAZOLIN SODIUM 1 GM IJ SOLR
INTRAMUSCULAR | Status: AC
Start: 2016-07-07 — End: 2016-07-07
  Filled 2016-07-07: qty 3000

## 2016-07-07 MED ORDER — HEPARIN SODIUM (PORCINE) 5000 UNIT/ML IJ SOLN
INTRAMUSCULAR | Status: AC
Start: 2016-07-07 — End: 2016-07-07
  Filled 2016-07-07: qty 3

## 2016-07-07 MED ORDER — FAMOTIDINE 20 MG/2ML IV SOLN
20.0000 mg | Freq: Every day | INTRAVENOUS | Status: DC
Start: 2016-07-07 — End: 2016-07-08
  Administered 2016-07-07 – 2016-07-08 (×3): 20 mg via INTRAVENOUS
  Filled 2016-07-07 (×2): qty 2

## 2016-07-07 MED ORDER — PROPOFOL 1000 MG/100 ML INFUSION (~~LOC~~)
0.0000 ug/kg/min | INTRAVENOUS | Status: DC
Start: 2016-07-07 — End: 2016-07-07

## 2016-07-07 MED ORDER — IODIXANOL 320 MG/ML IV SOLN
INTRAVENOUS | Status: AC
Start: 2016-07-07 — End: 2016-07-07
  Filled 2016-07-07: qty 100

## 2016-07-07 MED ORDER — BACITRACIN 500 UNIT/GM EX OINT (CUSTOM)
TOPICAL_OINTMENT | CUTANEOUS | Status: AC
Start: 2016-07-07 — End: 2016-07-07
  Filled 2016-07-07: qty 28.4

## 2016-07-07 MED ORDER — PROPOFOL 200 MG/20ML IV EMUL
INTRAVENOUS | Status: DC | PRN
Start: 2016-07-07 — End: 2016-07-08
  Administered 2016-07-07 (×4): 50 mg via INTRAVENOUS

## 2016-07-07 MED ORDER — PLASMA-LYTE A IV SOLN
INTRAVENOUS | Status: DC | PRN
Start: 2016-07-07 — End: 2016-07-08
  Administered 2016-07-07: 12:00:00 via INTRAVENOUS

## 2016-07-07 SURGICAL SUPPLY — 51 items
4X6" COVADERM DRESS (Dressings/packing) ×8 IMPLANT
4X8" COVADERM DRESS (Dressings/packing) ×4 IMPLANT
BAG DECANTER (Misc Medical Supply) ×4 IMPLANT
BIOPSY NEEDLE DISP STRYKER 6000-024-000 (Needles/punch/cannula/biopsy) ×4 IMPLANT
BLADE INTUBRITE MAC 3 DISP (Misc Surgical Supply) ×4 IMPLANT
BLADE SAW MICRO OSCSGTL THN DISP 2296-3-206 (Knives/Blades) ×4 IMPLANT
CATH - 5F 100CM ANGLED TAPER GLIDECATH (Procedural wires/sheaths/catheters/balloons/dilators) ×4 IMPLANT
CATH - 5F 65CM ANGLED TAPERED GLIDECATH (Procedural wires/sheaths/catheters/balloons/dilators) ×4 IMPLANT
CATH EMBOLECTOMY 2FR 40CM (Procedural wires/sheaths/catheters/balloons/dilators) ×8 IMPLANT
CATH EMBOLECTOMY 3FR 40CM (Procedural wires/sheaths/catheters/balloons/dilators) ×4 IMPLANT
DEVICE - TORQUE DEVICE (Disp Instruments) ×4 IMPLANT
DRAIN BLAKE 10MM FLAT 3/4 FLTD (Lines/Drains) ×4 IMPLANT
DRAPE ANGIO FEM INTERVENTIONAL 89463 (Drapes/towels) ×4 IMPLANT
DRAPE POST MICROSCOPE 05-MP1680 (Drapes/towels) ×4 IMPLANT
DRAPEIOBAN60X45CM6650EZ (Drapes/towels) ×4 IMPLANT
DRESSING - TELFA (Dressings/packing) ×4 IMPLANT
DRESSING MED. TEGADERM 4X4 (Dressings/packing) ×4 IMPLANT
EPIC BLADE CLIPPER  CAREFUSION (Misc Surgical Supply) ×4 IMPLANT
EVACUATOR WND SUCT CLSD LOW 100 CC RESERVOIR (Tubing/suction) ×4 IMPLANT
GLOVE CHARGE-GLOVES SURG (Misc Surgical Supply) ×80 IMPLANT
GOWN SURG X-LARGE (Gloves/gowns) ×20 IMPLANT
GUIDEWIRE CVD J .035" X 145 X 3 CM TSCF-35-145-3 (Procedural wires/sheaths/catheters/balloons/dilators) ×4 IMPLANT
GUIDEWIRE J-TIP .035 145CM (Misc Surgical Supply) ×4 IMPLANT
INTRODUCER 05 FR X 10 CM X .038" W/ WR (Procedural wires/sheaths/catheters/balloons/dilators) ×4 IMPLANT
KIT ANGIOTOUCH (Lines/Drains) ×4 IMPLANT
KIT CONTROL HAND ANGIOTOUCH 014644 (Kits/Sets/Trays) ×4 IMPLANT
KIT SYRINGE MULTIUSE 014612 (Kits/Sets/Trays) ×4 IMPLANT
LONG MED BLADE 31.0MM X9.0MM STYKER (Knives/Blades) ×4 IMPLANT
PACK ENDO VASCULAR PHS (Procedure Packs/kits) ×4 IMPLANT
PEN MARKING SKIN SURGICAL (Misc Surgical Supply) ×4 IMPLANT
PICO NPWT 10X30CM (S PUMP) (Procedure Packs/kits) ×4 IMPLANT
SET MICROPUNCTURE 5 FR MPIS-501-10.0-SC-NT-SST (Kits/Sets/Trays) ×8 IMPLANT
SHEATH - 5F X 10CM TERUMO PINNACLE W/ WIRE (Procedural wires/sheaths/catheters/balloons/dilators) ×4 IMPLANT
SOL CHLORAPREP 26ML ORG (Misc Surgical Supply) ×8 IMPLANT
SPONGER RAYTEC 16-PLY 04X04" DMT MT X-RAY STERI STRYKER (Dressings/packing) ×8 IMPLANT
SUTURE - 4.0 MONOCRYL (Suture) ×8 IMPLANT
SUTURE NYLON PS-2 3-0 18" 1669 (Suture) ×8 IMPLANT
SUTURE NYLON PSL 3-0 30" 1691 (Suture) ×8 IMPLANT
SUTURE PROLENE BV-1 7-0 DA 30" 8703 (Suture) ×40 IMPLANT
SUTURE PROLENE C-1 5-0 DA 36" 8720 (Suture) ×4 IMPLANT
SUTURE PROLENE C-1 6-0 DA MP 4X30" M8706 (Suture) ×8 IMPLANT
SUTURE SILK TIES 4-0 10X30" SA83 (Suture) ×4 IMPLANT
SUTURE VICRYL+ CT-1 2-0 UND 27" VCP259 (Suture) ×8 IMPLANT
SUTURE VICRYL+ SH 3-0 UND 27" VCP416 (Suture) ×12 IMPLANT
SYRINGE 20ML LUER LOCK TIP (Misc Medical Supply) ×8 IMPLANT
SYRINGE 3ML LUER LOCK TIP (Misc Medical Supply) ×16 IMPLANT
TAPE MARKING RADIOPAQ 30 CM GLOW & TELL STER DISP (Misc Surgical Supply) ×4 IMPLANT
TUBE SUCT SFTTP 10 FR BLOWER DLP (Tubing/suction) ×4 IMPLANT
WIRE - 035 X 150CM TERUMO ANGLED GLIDEWIRE STANDARD (Procedural wires/sheaths/catheters/balloons/dilators) ×8 IMPLANT
WIRE - 035 X 260CM TERUMO ANGLED GLIDEWIRE STANDARD (Procedural wires/sheaths/catheters/balloons/dilators) ×4 IMPLANT
WIRE - ADVANTAGE GLIDEWIRE 035 X 260CM (Procedural wires/sheaths/catheters/balloons/dilators) ×4 IMPLANT
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
## 2016-07-07 NOTE — Progress Notes (Signed)
Progress Note    Patient Name: Jerome Hunt  MRN: [id number]  Room#: 6232/6232-01    Service: [place] Attending Provider: Wyn Forster, MD    Mechanism of Injury: LLE arterial insufficiency    Date: 07/07/16  Hospital Day:   1 day - Admitted on: 07/06/2016  Post Operative Day(s): 1 Day Post-Op    left lower extremity angioplasty, left popliteal-distal posterior tibial bypass with LLE GSV harvest 07/06/16    SUBJECTIVE: Jerome Hunt is a 40 year old ex smoker with a history of traumatic left lower extremity injury 3 years ago underwent left lower extremity angioplasty, left popliteal-distal posterior tibial bypass with LLE GSV harvest 07/06/16 by Dr Rae Halsted    Overnight events: Patient underwent above procedure and was admitted to SICU post-operatively for vascular checks and post-operative management. Remained intubated secondary to acidosis. Currently on heparin gtt, PTT 25. LLE pulses not dopplerable or not palpable.    OBJECTIVE:     GENERAL: intubated, sedated  HEENT: NC/AT EOMI MMM ET tube and NGT in place  NECK: supple  ABDOMEN: soft, non-distended  GROIN: no evidence of hematoma at right femoral access site, pico dressing in place left groin  EXTREMITY: LLE with dressing c/d/i, no palpable or dopplerable pulses.   LLE warm to touch, palpable pulses RLE  LUE a line in place  Foley catheter in place, clear urine output      Lines, Drains and Airways:  Peripheral IV - 20 G Left Arm (Active)   Site Assessment Phlebitis 0 07/07/2016  4:00 AM   Line Lumen Status Infusing 07/07/2016  4:00 AM   Dressing Transparent 07/07/2016  4:00 AM   Dressing Status Clean, dry, intact 07/07/2016  4:00 AM       Peripheral IV - 18 G (Active)   Site Assessment Phlebitis 0 07/07/2016  4:00 AM   Line Lumen Status Infusing 07/07/2016  4:00 AM   Dressing Transparent 07/07/2016  4:00 AM   Dressing Status Clean, dry, intact 07/07/2016  4:00 AM       Arterial Line (ART) -  Left Radial (Active)    Site Assessment Phlebitis 0 07/07/2016  4:00 AM   Line Lumen Status Patent 07/07/2016  4:00 AM   Treatment Arm board on 07/07/2016  4:00 AM   Dressing Transparent 07/07/2016  4:00 AM   Dressing Status Clean, dry, intact 07/07/2016  4:00 AM       Vitals signs:     Latest Entry  Range (last 24 hours)    Temperature: 99 F (37.2 C)  Temp  Avg: 98.9 F (37.2 C)  Min: 98.8 F (37.1 C)  Max: 99 F (37.2 C)    Blood pressure (BP): 117/65  BP  Min: 116/66  Max: 142/85    Heart Rate: 96  Pulse  Avg: 104.1  Min: 95  Max: 112    Respirations: 14  Resp  Avg: 15.2  Min: 14  Max: 18    SpO2: 97 %  SpO2  Avg: 96.9 %  Min: 95 %  Max: 98 %       No Data Recorded       No Data Recorded       No Data Recorded     Weight: 99.5 kg (219 lb 5.7 oz)  Percentage Weight Change (%): -8.8 %    Labs:  CBC  Recent Labs      07/06/16   1930   HGB  12.3*   HCT  37.5*  Chemistry  Recent Labs      07/06/16   1930  07/06/16   2130  07/06/16   2137  07/07/16   0247  07/07/16   0637   K  5.5*  4.3   --   4.9   --    CL  104  102   --   106   --    BICARB   --    --   19.4*   --   23.4   BUN  8  9   --   8   --    CREAT  1.1  1.1   --   0.9   --    GLU  167*  140*   --   147*   --    [place]  10.6*  9.9   --   8.8   --    MG  1.6*   --    --   2.4   --    PHOS  3.9   --    --   2.1*   --      Recent Labs      07/06/16   1930   ALK  57   AST  16   ALT  13   TBILI  0.3   ALB  4.4       Coags  Recent Labs      07/06/16   1930  07/06/16   2130  07/07/16   0247   PTT  >200.0*  >200.0*  24.9   INR  1.29*   --   1.08       Medications:  Scheduled Meds   docusate sodium  250 mg BID    lidocaine (UROJET)  5 mL Once    povidone-iodine  1 Application BID    sodium chloride  1,000 mL Once     PRN Meds   fentaNYL  25 mcg Q5 Min PRN    fentaNYL  25 mcg Q2H PRN    ondansetron  4 mg Q6H PRN    propofol  30 mg Q3 Min PRN    propofol  30 mg Q2H PRN     IV Meds   dextrose-sodium chloride 5%-0.9% 150 mL/hr at 07/07/16 0425     fentaNYL 150 mcg/hr (07/07/16 0600)    heparin (porcine) 1,000 Units/hr (07/07/16 0600)    propofol 80 mcg/kg/min (07/07/16 0600)       Allergies:  Allergies   Allergen Reactions    No Known Allergies [Other] Other     Received name: No Known Allergies       ASSESSMENT / PLAN / RECOMMENDATIONS:  No longer palpable/dopplerable pulses this AM. Will require emergent surgical intervention.    Neuro:  Intubated and sedated   Fentanyl/propofol gtt    CV:BP  Min: 116/66  Max: 142/85  Pulse  Avg: 104.1  Min: 95  Max: 112  Continue to monitor    Pulmonary:Resp  Avg: 15.2  Min: 14  Max: 18  SpO2  Avg: 96.9 %  Min: 95 %  Max: 98 %    Intubated  Acidosis improving                 FEN/GI:  NPO while intubated  NGT in place  GI PPX    Renal:   Intake/Output       07/06/16 0600 - 07/07/16 0559 07/07/16 0600 - 07/08/16 0559      7253-6644  0786-7544 Total 0600-1759 9201-0071 Total       Intake    I.V.  2500  5393.7 7893.7  212.4  -- 212.4    Total Intake 2500 5393.7 7893.7 212.4 -- 212.4       Output    Urine  1500  8250 9750  600  -- 600    Emesis/NG Output  --  0 0  --  -- --    Blood  --  75 75  --  -- --    Total Output 1500 8325 9825 600 -- 600       Net I/O     1000 -2931.3 -1931.3 -387.6 -- -387.6        Trend CK    ID: Temp  Avg: 98.9 F (37.2 C)  Min: 98.8 F (37.1 C)  Max: 99 F (37.2 C)  Received peri-op abx  WBC 17.8  Blood cx negative x1day  Follow-up Ucx    Hematology:  H/H stable  Heparin gtt    Endocrine:  Monitor blood sugars    Prophylaxis:  SCD RLE   Heparin gtt  GI ppx  Bowel regimen    Dispo:  SICU    I attest that I had personally interviewed and examined Jerome Hunt during this encounter and have reviewed all of the available diagnostic studies along with the resident physician.  IHilma Favors, MD, agree with the recorded history, physical examination and evaluation above and have supervised formulation of the stated impression, recommendations and care plan.     Bypass not palpable this am, no doppler signal and left foot is cold. Patient's wife contacted and discussed return to the operating room for revision of bypass, vein harvest, angiogram with possible percutaneous intervention possible resection of fibula.    Hilma Favors, MD  07/07/2016 9:00 AM

## 2016-07-07 NOTE — Brief Op Note (Signed)
BRIEF OPERATIVE NOTE    DATE: 07/07/2016  TIME: 9:30 PM    PREOPERATIVE DIAGNOSIS:   Peripheral arterial disease; tibioperoneal occlusive disease   Post-operative thrombosis of left below-knee popliteal-posterior tibial reverse saphenous vein bypass    POSTOPERATIVE DIAGNOSIS:   Peripheral arterial disease; tibioperoneal occlusive disease   Post-operative thrombosis of left below-knee popliteal-posterior tibial reverse saphenous vein bypass    PROCEDURE INFORMATION:  1) Aortoiliac and selective left lower extremity angiography  2) Ultrasound guided right common femoral artery needle entry and third order catheter placement x 2  3) Thrombectomy of left below-knee popliteal-posterior tibial reverse saphenous vein bypass  4) Left transinterosseous below-knee popliteal-peroneal and sequential peroneal-distal posterior tibal-medial tarsal reverse saphenous vein bypass  5) Distal posterior tibial/plantar artery endarterectomy    ATTENDING SURGEON:   Surgeon(s) and Role:     * Shawntell Dixson, Marnee Springoy M, MD - Primary     * Hurshel KeysKuo, Isabella, MD - Assistant Surgeon       RESIDENT:  Isac CaddyNatalie Barton, PGY3    ANESTHESIA: General    FINDINGS: Thrombosed left below-knee popliteal-posterior tibial reverse saphenous vein bypass  Extremely attenuated runoff through distal peroneal and distal posterior tibial/plantar arteries    Fluids/Blood Products:      IV Fluids: 1500mL    Blood Products: none    EBL: 300 mL    Urine Output: 3000mL    COMPLICATIONS: none    DISPOSITION: SICU    ATTENDING ADDENDUM: Procedure on Hilario QuarryBrandon Costanzo for date of service is 07/07/2016; 21:29. I, Purcell Moutonoy M Pandora Mccrackin, MD, was present for all key portions of the procedure. I have reviewed the note and agree with the documented findings and care. I have dictated the full comprehensive operative note.    Purcell Moutonoy M Roshelle Traub, MD  07/08/2016 2:37 PM

## 2016-07-07 NOTE — Plan of Care (Signed)
Problem: Promotion of health and safety  Goal: Promotion of Health and Safety  The patient remains safe, receives appropriate treatment and achieves optimal outcomes (physically, psychosocially, and spiritually) within the limitations of the disease process by discharge.   Outcome: Progressing toward goal, anticipate improvement over: >48 hours   07/07/16 1830   Adult/Peds Plan of Care   Standard of Care/Policy Critical Care SOC;[facility];Restraints SOC   Outcome Evaluation (rationale for progressing/not progessing Pt had to go to OR today for revaluation of bypass.    Additional Individualized Interventions/Recommendations  Q1 hour neurovascular checks to bilaterally legs.    Additional Individualized Interventions/Recommendations Q2 hour turns and protect skin.    Nursing Shift Summary    No data found.    No data found.      Rounding for the past 12 hrs:   Physician Rounding   07/07/16 0840 My nursing colleague or I joined the physician during  his/her interaction/rounds with the patient at least once during my shift       Shift Comments for the past 12 hrs:   Comments   07/07/16 0745 Change of shift. Night RN Jerome Hunt at bedside preforming doppler exam to BLE.  RLE doppler able DP pulse. Night RN Jerome Hunt able to paplate +1 pulse on LLE DP.  Doppler applied to LLE DP pulse site and weak audible pulse present.  Night RN Jerome Hunt confirmed this was the way the pt's pulses have been during the night (the last few hours at least) and Fabain said the night MD was aware of this and came to the bedside to cofirm the weak pulse to LLE DP site.    07/07/16 0840 Dr. Simonne MartinetHodad Naderi, Isac CaddyNatalie Barton and Lissa MerlinKuo at bedside. Unable to palplate or doppler pulse on LLE DP or PT pulse. Dr. Leonel RamsayNadiri and Laurence ComptonBarton notified that night shift RN Jerome Hunt had difficulty obtiaing pulse over night and that night MD was aware.     07/07/16 1119 Vascular Sx resident paged via paging system. MD notified  that pt's wife Jerome Hunt is at bedside and if the MD can update her on pt's circulation and plan of care.    07/07/16 1141 Jerome Contrasachel, pt's wife, escorted down to OR enterance with pt, RT, RN, and transporter.  Jerome Hunt was updated on pt's status by Dr. Randolm IdolFujitani, Kuo, and Laurence ComptonBarton at OR enterance.  All pts questions were answered and she signed the consent for the OR procedure.  Jerome Hunt also informed Dr. Lissa MerlinKuo and Laurence ComptonBarton that she thinks the pt relapsd and drank a "bottle of vodka" prior to his hospital admission. She said she found the bottle at home this morning when she went home earlier.

## 2016-07-07 NOTE — H&P (Signed)
HISTORY & PHYSICAL - INTERVAL ASSESSMENT & UPDATE     Jerome Hunt        MRN: [id number]    An interval assessment is required for History & Physical completed less than 30 days prior to the admission or surgery. A History & Physical completed more than 30 days prior to the admission or surgery must be repeated (for same day admit or outpatient procedures).  This is not required for inpatients that have a History and Physical, consult note or daily progress note containing a complete examination, diagnosis and reason for surgery, and patient's condition.    I have reviewed the current history and physical (which is less than 6530 days old) and verified its content. I have reexamined the patient today. There are no significant changes to medical status, history or physical examinations in the 24 hours prior to the procedure.  Pre-operative antibiotics ordered as appropriate for organ system/body area of surgery.      Isac CaddyNatalie Zolton Dowson, MD       07/07/16  9:07 AM

## 2016-07-07 NOTE — Interdisciplinary (Signed)
Not able to SBT due to AC/PC mode of ventilation. Pt is also Maxed on Propofol. Pt on Fentanyl. Plans to wean slowly on medication t/o AM.

## 2016-07-07 NOTE — Plan of Care (Addendum)
Problem: Promotion of health and safety  Goal: Promotion of Health and Safety  The patient remains safe, receives appropriate treatment and achieves optimal outcomes (physically, psychosocially, and spiritually) within the limitations of the disease process by discharge.   Outcome: Progressing toward goal, anticipate improvement over: next 12-24 hours  Nursing Shift Summary    Provider Notification for the past 12 hrs:   Provider Name Reason for Communication Provider Response   07/07/16 0624 Dr. Higinio Plan Farzaneh unable to find left pedal pulse with dopper, but able to palpate MD at bedside, assessed pt, MD able to palpate pulse       Critical Value Notification for the past 12 hrs:   Lab Test Name Test Result Provider Name Comments   07/06/16 2241 aPTT greater than 200 Al-Khouji Heparin on hold per pharmacy         No data found.    Shift Comments for the past 12 hrs:   Comments   07/07/16 0400 Dr. Laddie AquasAl-Khouji at bedside MD notified pt last PTT results, pharmacist notified of PTT result.

## 2016-07-07 NOTE — Interdisciplinary (Signed)
Patient was transported to OR by bagging 14/min with BVM 15lpm oxygen,no respiratory distress seen on the way and handed over to the anaesthetist team.

## 2016-07-07 NOTE — Progress Notes (Signed)
Inpatient Heparin Drug Monitoring Note    Jerome Hunt is a 40 year old male on anticoagulation therapy for left popliteal-distal posterior tibial bypass with LLE GSV harvest 07/06/16 . Patient received heparin 29,000 units in incremental boluses intra-op during surgery yesterday with PTT>200 at 21:90 on 12/1. Per Dr. Julieta BelliniNaderi overnight PTT=24.9 at 0247 on 12/2 and LLE pulses is not dopplerable or palpable. PTT goal was change from 60-80 to 65-90 per Dr. Julieta BelliniNaderi request.    PMH:  Traumatic left lower extremity 2014  Claudication    aPTT Goal:   65 - 90 seconds    Dosing weight = 99.5kg     Current Heparin Rate:     Dose (Units/hr) Heparin: 1000 Units/hr      Labs:    PTT (last 24 hours):  -43.9 @0830  on 12/2    Labs:  (Last 3 days):  Recent Labs      07/06/16   1930  07/06/16   2130  07/07/16   0247   HGB  12.3*   --    --    HCT  37.5*   --    --    CREAT  1.1  1.1  0.9       Labs (Last values):  Albumin   Date Value Ref Range Status   07/06/2016 4.4 4.2 - 5.5 G/DL Final     AST   Date Value Ref Range Status   07/06/2016 16 13 - 39 U/L Final     ALT   Date Value Ref Range Status   07/06/2016 13 7 - 52 U/L Final     Bilirubin, Total   Date Value Ref Range Status   07/06/2016 0.3 0.0 - 1.4 mg/dL Final       Drug/Disease Interactions and Assessments:    - Other Anticoagulants:   Current Facility-Administered Medications   Medication Dose Frequency    heparin (porcine)  1,400 Units/hr Continuous       - Bleeding Assessment:  H/H is stable. Patient does not exhibit any signs or symptoms of bleeding (spoke to [employer]).     - Current Nutrition Assessment:   Recent changes in nutrition/diet? no       Assessment/Plan:  - aPTT= 43.9 seconds is below therapeutic goal range.   - Will bolus heparin 8000 units and increase maintenance infusion to 1400 units/hour   - Check aPTT every 8hours, to be drawn at 18:00  - Continue to monitor for signs and symptoms of bleeding     Pharmacy will continue to monitor closely. For questions please call [phone]    Frutoso SchatzKathleen Thai Ansel BongBuu Crystalynn Mcinerney, [facility] CenterHARMD

## 2016-07-07 NOTE — Consults (Signed)
SURGICAL CRITICAL CARE CONSULT NOTE    Date of Evaluation: July 07, 2016  Reason for Consult: Critical care    History of Present Illness:  Jerome Hunt is a 40 year old ex smoker with a history of traumatic left lower extremity injury 3 years ago underwent left lower extremity angioplasty, left popliteal-distal posterior tibial bypass with LLE GSV harvest 07/06/16 by Dr Rae Halsted.     IV Fluids: 3L crystalloid, 500 albumin    Blood Products: none    EBL: 75cc    Urine Output: 1.5L   Patient admitted to SICU postoperatively for vascular checks and post-operative management.     Overnight issues:  Remained intubated 2/2 acidosis. Heparin gtt, PTT25, LLE pulses not dopplerable or palpable. Patient to return to OR for emergent surgical intervention    Past Medical History:  Traumatic left lower extremity 2014  Claudication    Past Surgical History:  03/22/16: Balloon angioplasty    Family History:  No family history on file.    Social History:  Social History     Social History    Marital status: Married     Spouse name: N/A    Number of children: N/A    Years of education: N/A     Social History Main Topics    Smoking status: Former Smoker    Smokeless tobacco: Former Systems developer    Alcohol use Not on file    Drug use: Not on file    Sexual activity: Not on file     Social Activities of Daily Living Present    Not on file     Social History Narrative    No narrative on file       Allergies:  No known allergies [other]  Allergies   Allergen Reactions    No Known Allergies [Other] Other     Received name: No Known Allergies       Outpatient Medications:  Prescriptions Prior to Admission   Medication Sig Dispense Refill Last Dose    amitriptyline (ELAVIL) 25 MG tablet 25 mg daily.  4 07/05/2016 at Unknown time    ASPIRIN 81 MG EC tablet TK 1 T PO QD  0 07/01/2016    ASPIRIN 81 PO Take 81 mg by mouth.       gabapentin (NEURONTIN) 300 MG capsule Take 300 mg by mouth 3 times daily.        HYDROcodone-acetaminophen (NORCO) 5-325 MG tablet Take 1 tablet by mouth every 12 hours as needed for Severe Pain (Pain Score 7-10). 45 tablet 0 07/05/2016 at Unknown time    HYDROcodone-acetaminophen (NORCO) 7.5-325 MG tablet Take 1 tablet by mouth every 8 hours as needed.  0     oxyCODONE-acetaminophen (PERCOCET) 5-325 MG tablet   0 07/05/2016 at Unknown time    pentoxifylline (TRENTAL) 400 MG Controlled-Release tablet Take 400 mg by mouth.   06/22/2016       Current Medications:   docusate sodium  250 mg BID    Heparin Per Pharmacy   As Directed    lidocaine (UROJET)  5 mL Once    magnesium sulfate  4 g Once    povidone-iodine  1 Application BID    bolus IV fluid   Once       Vitals:  Temperature:  [98.8 F (37.1 C)-99 F (37.2 C)] 98.8 F (37.1 C) (12/01 2050)  Blood pressure (BP): (116-142)/(62-85) 121/62 (12/01 2300)  Heart Rate:  [103-112] 103 (12/01 2300)  Respirations:  [14-18] 14 (  12/01 2300)  Pain Score: CPOT (12/01 2152)  O2 Device: ETT (12/01 2300)  SpO2:  [96 %-98 %] 97 % (12/01 2300)    Physical Exam:  GENERAL: intubated, sedated  HEENT: NC/AT EOMI MMM ET tube and NGT in place  NECK: supple  ABDOMEN: soft, non-distended  GROIN: no evidence of hematoma at right femoral access site, pico dressing in place left groin  EXTREMITY: LLE with dressing c/d/i, no palpable or dopplerable pulses.   LLE cold to touch,  LUE a line in place  Foley catheter in place, clear urine output    Labs:  Lab Results   Component Value Date    K 4.9 07/07/2016    CL 106 07/07/2016    BICARB 23.4 07/07/2016    BUN 8 07/07/2016    CREAT 0.9 07/07/2016    GLU 147 (H) 07/07/2016    [place] 8.8 07/07/2016     Lab Results   Component Value Date    HGB 12.3 (L) 07/06/2016    HCT 37.5 (L) 07/06/2016     Lab Results   Component Value Date    HGB 12.3 (L) 07/06/2016    HCT 37.5 (L) 07/06/2016     Lab Results   Component Value Date    INR 1.08 07/07/2016    PTT 43.9 (H) 07/07/2016       ASSESSMENT / PLAN / RECOMMENDATIONS:   40 year old male pod1 LLE bypass, c/b subtherapeutic and thrombosis.  -bolus heparin  -emergent return to OR for revascularization    Dispo: SICU postoperatively    SICU Attending Attestation:    Remained in SICU overnight secondary to above diagnoses and due to concern for possible sudden deterioration. Critical care time >30 minutes today, excluding teaching and procedures.  Patient met criteria for critical illness and ICU level of care during my evaluation and treatment due to the following diagnoses and/or management issues:    1. S/P angioplasty  2. Heparin induced coagulopathy    Continue drip  Serial vascular exams  dispo per primary team      Alfonso Patten, MD  [facility] Clinical Professor  Department of Surgery

## 2016-07-07 NOTE — Interdisciplinary (Signed)
Alarms adjusted per Pts need and  breathing pattern

## 2016-07-07 NOTE — Anesthesia Preprocedure Evaluation (Addendum)
ANESTHESIA PRE-OPERATIVE EVALUATION    Patient Information    Name: Jerome Hunt    MRN: [id number]    DOB: 08/13/1975    Age: 39 year old    Sex: male  Procedure(s):  LEFT POPLITEAL TO POSTERIOR TIBILA ARTERY BYPASS WITH LEFT SAPHENOUS VEIN AORTOGRAM LEFT LEG ANGIOGRAM      X  X      There were no vitals taken for this visit.        Primary language spoken:  English    ROS/Medical History:    Patient summary reviewed    General Review & History of Anesthetic Complications:    chronic pain patient (on norco 5 and gabapentin) no history of anesthetic complications and no PONV     03/2016 LLE angiogram with attempted angioplasty  07/06/16 vascular sx; DL x2, initially with Mac 4-- large epiglottis; changed to miller 2, G1 view );  Cardiovascular:    Exercise tolerance: >4 METS  (+)  hypercholesterolemia  hypertension  valvular problems/murmurs (congenital murmur per pt)            ECG reviewed Comment: 11/1/17NSR 75bpm borderline R axis deviation  ROS comment: history of traumatic left lower extremity injury 2014    07/06/16  left lower extremity angioplasty, left popliteal-distal posterior tibial bypass with LLE GSV harvest  admitted to SICU post-operatively for vascular checks and post-operative management. Remained intubated secondary to acidosis. Currently on heparin gtt, PTT 25. LLE pulses not dopplerable or not palpable. Pending return to OR 12/2     Currently on pentoxifylline 06/05/2015 started on amitriptyline for pain         Pulmonary:     (+) recent URI (3 weeks ago resolved sx) resolved, , , , ,       ROS comment: 12/2 remains intubated from 12/1 procedure 2/t intra-op acidosis. ABG normalized mornign of 12/2    12pack year smoker quit 03/2016 Hematology/Oncology:   - negative ROS   Neuro/Psych:        (+) psychiatric history (anxiety and depression on amitryptilline),     Infectious Disease:   Negative ROS         Endo/Other:        ROS Comments: Obese BMI of 30.1 GI/Hepatic:     (+) GERD,      ROS Comments: History of alcoholism, in remission per PMD note of 06/04/2016   Renal:    - Negative ROS        Pregnancy History:             Pre Anesthesia Testing (PCC/CPC) notes/comments:                   Physical Exam    Airway:    Comment: ETT insitu              Cardiovascular:  - cardiovascular exam normal         Pulmonary:  - pulmonary exam normal           Neuro/Neck/Skeletal/Skin:  - [place] ANE PHYS EXAM NEGATIVE ROS SKIN SKELETAL NEURO NECK          Dental:  - normal exam    Abdominal:              Last  OSA (STOP BANG) Score:  No Data Recorded    Last OSA  (STOP) Score for   No Data Recorded                   No past medical history on file.  No past surgical history on file.  Social History   Substance Use Topics    Smoking status: Former Smoker    Smokeless tobacco: Former Systems developer    Alcohol use Not on file       No current facility-administered medications for this visit.      No current outpatient prescriptions on file.     Facility-Administered Medications Ordered in Other Visits   Medication Dose Route Frequency Provider Last Rate Last Dose    dextrose-sodium chloride 5%-0.9% infusion   IntraVENOUS Continuous Al-Khouja, Rochele Raring, MD 150 mL/hr at 07/07/16 0425      docusate (COLACE) liquid 250 mg  250 mg NG Tube Daily Arman Bogus, MD   Stopped at 07/07/16 0900    famotidine (PEPCID) injection 20 mg  20 mg IntraVENOUS Daily Arman Bogus, MD   20 mg at 07/07/16 0947    fentaNYL (SUBLIMAZE) 5,000 mcg/100 mL premix infusion  0-300 mcg/hr IntraVENOUS Continuous Hollie Salk, MD 3 mL/hr at 07/07/16 0600 150 mcg/hr at 07/07/16 0600    fentaNYL (SUBLIMAZE) dose from bag  25 mcg IntraVENOUS Q5 Min PRN Hollie Salk, MD   25 mcg at 07/07/16 0400    fentaNYL (SUBLIMAZE) dose from bag  25 mcg IntraVENOUS Q2H PRN Hollie Salk, MD        heparin 25,000 units in 250 mL 0.45% NS PREMIX infusion  1,400 Units/hr  IntraVENOUS Continuous Alben Spittle, MD 14 mL/hr at 07/07/16 0940 1,400 Units/hr at 07/07/16 0940    Heparin Per Pharmacy   Other As Directed Alben Spittle, MD        lidocaine (UROJET) 2 % topical jelly 5 mL  5 mL Urethral Once Hollie Salk, MD        ondansetron [facility]) injection 4 mg  4 mg IntraVENOUS Q6H PRN Hollie Salk, MD        povidone-iodine 10 % swab 1 Application  1 Application Topical BID Al-Khouja, Rochele Raring, MD   1 Application at 16/96/78 2130    propofol (DIPRIVAN) bolus 10 mg/mL  30 mg IntraVENOUS Q3 Min PRN Hollie Salk, MD   30 mg at 07/07/16 0724    propofol (DIPRIVAN) bolus 10 mg/mL  30 mg IntraVENOUS Q2H PRN Hollie Salk, MD        propofol (DIPRIVAN) infusion (1000 mg/100 mL)  0-80 mcg/kg/min IntraVENOUS Continuous Hollie Salk, MD 52.4 mL/hr at 07/07/16 0946 80 mcg/kg/min at 07/07/16 0946     Allergies   Allergen Reactions    No Known Allergies [Other] Other     Received name: No Known Allergies       Labs and Other Data  Lab Results   Component Value Date    K 4.9 07/07/2016    CL 106 07/07/2016    BICARB 23.4 07/07/2016    BUN 8 07/07/2016    CREAT 0.9 07/07/2016    GLU 147 07/07/2016    [place] 8.8 07/07/2016     Lab Results   Component Value Date    AST 16 07/06/2016    ALT 13 07/06/2016    ALK 57 07/06/2016    ALB 4.4 07/06/2016    TBILI 0.3 07/06/2016     Lab Results   Component Value Date    RBC 3.90 07/06/2016    HGB 12.3 07/06/2016    HCT 37.5 07/06/2016    MCV 96.1 07/06/2016    MCHC 32.8 07/06/2016  RDW 15.0 07/06/2016     Lab Results   Component Value Date    INR 1.08 07/07/2016    PTT 43.9 07/07/2016     No results found for: ARTPH, ARTPO2, ARTPCO2    Anesthesia Plan:  Risks and Benefits of Anesthesia  I personally examined the patient immediately prior to the anesthetic and reviewed the pertinent medical history, drug and allergy history,  laboratory and imaging studies and consultations. I have determined that the patient has had adequate assessment and testing.    Anesthetic techniques, invasive monitors, anesthetic drugs for induction, maintenance and post-operative analgesia, risks and alternatives have been explained to the patient and/or patient's representatives.    I have prescribed the anesthetic plan:         Planned anesthesia method: General         ASA 4 (Incapacitating disease) - emergent     Potential anesthesia problems identified and risks including but not limited to the following were discussed with patient and/or patient's representative: Adverse or allergic drug reaction, Administration of blood products, Recall, Ocular injury, Dental injury or sore throat, Nerve injury, Injury to brain, heart and other organs, Death and Patient declined further  discussion of risks of anesthesia    Planned monitoring method: Routine monitoring and Arterial line monitoring    Informed Consent:  Anesthetic plan and risks discussed with Parent/Guardian.

## 2016-07-07 NOTE — Interdisciplinary (Signed)
Pt waking up and attempting to move at this time.

## 2016-07-07 NOTE — Interdisciplinary (Signed)
Imaging results showed ETT high above carina. Per MD order RT Advanced ETT to 24 at [place] at this time.

## 2016-07-08 ENCOUNTER — Inpatient Hospital Stay: Payer: No Typology Code available for payment source

## 2016-07-08 ENCOUNTER — Encounter
Admission: RE | Disposition: A | Discharge status: Home Health | Payer: Self-pay | Attending: Vascular & Interventional Radiology

## 2016-07-08 DIAGNOSIS — I743 Embolism and thrombosis of arteries of the lower extremities: Secondary | ICD-10-CM

## 2016-07-08 LAB — CBC WITH DIFF, BLOOD
ANC automated: 7.9 10*3/uL (ref 2.0–8.1)
ANC automated: 7.7 10*3/uL (ref 2.0–8.1)
Basophils %: 0.4 %
Basophils %: 0.7 %
Basophils Absolute: 0 10*3/uL (ref 0.0–0.2)
Basophils Absolute: 0.1 10*3/uL (ref 0.0–0.2)
Eosinophils %: 0.3 %
Eosinophils %: 1.2 %
Eosinophils Absolute: 0 10*3/uL (ref 0.0–0.5)
Eosinophils Absolute: 0.1 10*3/uL (ref 0.0–0.5)
Hematocrit: 25.6 % — ABNORMAL LOW (ref 39.5–50.0)
Hematocrit: 25.4 % — ABNORMAL LOW (ref 39.5–50.0)
Hgb: 8.9 G/DL — ABNORMAL LOW (ref 13.5–16.9)
Hgb: 8.5 G/DL — ABNORMAL LOW (ref 13.5–16.9)
Lymphocytes %: 20.4 %
Lymphocytes %: 25.1 %
Lymphocytes Absolute: 2.4 10*3/uL (ref 0.9–3.3)
Lymphocytes Absolute: 3 10*3/uL (ref 0.9–3.3)
MCH: 32.9 PG (ref 27.0–33.5)
MCH: 31.9 PG (ref 27.0–33.5)
MCHC: 34.7 G/DL (ref 32.0–35.5)
MCHC: 33.4 G/DL (ref 32.0–35.5)
MCV: 95 FL (ref 81.5–97.0)
MCV: 95.4 FL (ref 81.5–97.0)
MPV: 10.3 FL (ref 7.2–11.7)
MPV: 10.3 FL (ref 7.2–11.7)
Monocytes %: 10.4 %
Monocytes %: 9.7 %
Monocytes Absolute: 1.2 10*3/uL — ABNORMAL HIGH (ref 0.0–0.8)
Monocytes Absolute: 1.2 10*3/uL — ABNORMAL HIGH (ref 0.0–0.8)
Neutrophils % (A): 68.5 %
Neutrophils % (A): 63.3 %
PLT Count: 168 10*3/uL (ref 150–400)
PLT Count: 167 10*3/uL (ref 150–400)
RBC: 2.7 10*6/uL — ABNORMAL LOW (ref 3.70–5.00)
RBC: 2.67 10*6/uL — ABNORMAL LOW (ref 3.70–5.00)
RDW-CV: 15.4 % — ABNORMAL HIGH (ref 11.6–14.4)
RDW-CV: 15.2 % — ABNORMAL HIGH (ref 11.6–14.4)
White Bld Cell Count: 11.6 10*3/uL — ABNORMAL HIGH (ref 4.0–10.5)
White Bld Cell Count: 12.1 10*3/uL — ABNORMAL HIGH (ref 4.0–10.5)

## 2016-07-08 LAB — MRSA SCREEN CULTURE
Culture Result: NEGATIVE
Culture Result: NEGATIVE

## 2016-07-08 LAB — ARTERIAL BLOOD GAS
Base Excess: 2.6 mmol/L
HCO3: 26.7 mmol/L (ref 21.0–27.0)
Inspired O2: 0.4
O2 Sat: 99.3 % — ABNORMAL HIGH (ref 95.0–99.0)
Patient Temp: 37.4 DEG C
pCO2 (Temp Adjusted): 39.3 MMHG (ref 36.0–42.0)
pH (Temp Adjusted): 7.45 — ABNORMAL HIGH (ref 7.38–7.42)
pO2 (Temp Adjusted): 177 MMHG — ABNORMAL HIGH (ref 80.0–104.0)

## 2016-07-08 LAB — PTT CRT VAL CALL

## 2016-07-08 LAB — CPK-CREATINE PHOSPHOKINASE, BLOOD
CK: 1869 U/L — ABNORMAL HIGH (ref 30–223)
CK: 2635 U/L — ABNORMAL HIGH (ref 30–223)

## 2016-07-08 LAB — BASIC METABOLIC PANEL, BLOOD
BUN: 9 mg/dL (ref 7–25)
CO2: 26 mmol/L (ref 21–31)
Calcium: 7.8 mg/dL — ABNORMAL LOW (ref 8.6–10.3)
Chloride: 110 mmol/L — ABNORMAL HIGH (ref 98–107)
Creat: 0.7 mg/dL (ref 0.7–1.3)
Electrolyte Balance: 5 mmol/L (ref 2–12)
Glucose: 132 mg/dL — ABNORMAL HIGH (ref 70–115)
Potassium: 3.9 mmol/L (ref 3.5–5.1)
Sodium: 141 mmol/L (ref 136–145)
eGFR - high estimate: >60 (ref >59)
eGFR - low estimate: >60 (ref >59)

## 2016-07-08 LAB — LACTATE, BLOOD
Lactic Acid: 1.8 mmol/L (ref 0.5–2.2)
Lactic Acid: 0.7 mmol/L (ref 0.5–2.2)

## 2016-07-08 LAB — PHOSPHORUS, BLOOD: Phosphorus: 2.1 MG/DL — ABNORMAL LOW (ref 2.5–5.0)

## 2016-07-08 LAB — APTT, BLOOD: PTT: 159.5 s (ref 24.1–36.3)

## 2016-07-08 LAB — MAGNESIUM, BLOOD: Magnesium: 2.1 mg/dL (ref 1.9–2.7)

## 2016-07-08 SURGERY — ANGIOGRAM, LOWER EXTREMITY, WITH ANGIOPLASTY
Anesthesia: General

## 2016-07-08 MED ORDER — IODIXANOL 320 MG/ML IV SOLN
INTRAVENOUS | Status: AC
Start: 2016-07-08 — End: 2016-07-08
  Filled 2016-07-08: qty 100

## 2016-07-08 MED ORDER — LORAZEPAM 2 MG/ML IJ SOLN
2.0000 mg | INTRAMUSCULAR | Status: DC | PRN
Start: 2016-07-08 — End: 2016-07-10
  Administered 2016-07-08 – 2016-07-10 (×8): 2 mg via INTRAVENOUS
  Filled 2016-07-08 (×7): qty 1

## 2016-07-08 MED ORDER — SODIUM CHLORIDE 0.9 % IV BOLUS (~~LOC~~)
1000.00 mL | INJECTION | Freq: Once | INTRAVENOUS | Status: AC
Start: 2016-07-08 — End: 2016-07-08
  Administered 2016-07-08 (×2): 1000 mL via INTRAVENOUS

## 2016-07-08 MED ORDER — HEPARIN SODIUM (PORCINE) 1000 UNIT/ML IJ SOLN
8000.00 [IU] | Freq: Once | INTRAMUSCULAR | Status: AC
Start: 2016-07-08 — End: 2016-07-08
  Administered 2016-07-08 (×2): 8000 [IU] via INTRAVENOUS
  Filled 2016-07-08: qty 8

## 2016-07-08 MED ORDER — LIDOCAINE HCL 1 % IJ SOLN
INTRAMUSCULAR | Status: AC
Start: 2016-07-08 — End: 2016-07-08
  Filled 2016-07-08: qty 40

## 2016-07-08 MED ORDER — SODIUM CHLORIDE 0.9 % IV SOLN
INTRAVENOUS | Status: DC
Start: 2016-07-08 — End: 2016-07-08

## 2016-07-08 MED ORDER — TAB-A-VITE/BETA CAROTENE PO TABS
1.00 | ORAL_TABLET | Freq: Every day | ORAL | Status: AC
Start: 2016-07-08 — End: 2016-07-11
  Filled 2016-07-08 (×2): qty 1

## 2016-07-08 MED ORDER — CEFAZOLIN SODIUM-DEXTROSE 2-4 GM/100ML-% IV SOLN
2000.00 mg | Freq: Once | INTRAVENOUS | Status: AC
Start: 2016-07-08 — End: 2016-07-08
  Administered 2016-07-08 (×2): 2000 mg via INTRAVENOUS
  Filled 2016-07-08: qty 2

## 2016-07-08 MED ORDER — IODIXANOL 320 MG/ML IV SOLN
INTRAVENOUS | Status: AC
Start: 2016-07-08 — End: 2016-07-08
  Filled 2016-07-08: qty 300

## 2016-07-08 MED ORDER — HEPARIN PER PHARMACY - ~~LOC~~
Status: DC
Start: 2016-07-08 — End: 2016-07-10

## 2016-07-08 MED ORDER — FAMOTIDINE 20 MG/2ML IV SOLN
20.0000 mg | Freq: Two times a day (BID) | INTRAVENOUS | Status: DC
Start: 2016-07-08 — End: 2016-07-11
  Administered 2016-07-08 – 2016-07-11 (×7): 20 mg via INTRAVENOUS
  Filled 2016-07-08 (×6): qty 2

## 2016-07-08 MED ORDER — LORAZEPAM 2 MG/ML IJ SOLN
4.0000 mg | INTRAMUSCULAR | Status: DC | PRN
Start: 2016-07-08 — End: 2016-07-10
  Administered 2016-07-09 – 2016-07-10 (×7): 4 mg via INTRAVENOUS
  Filled 2016-07-08 (×6): qty 2

## 2016-07-08 MED ORDER — HEPARIN 25,000 UNITS/250 ML 1/2 NS PREMIX INFUSION (~~LOC~~)
1500.0000 [IU]/h | INTRAMUSCULAR | Status: DC
Start: 2016-07-08 — End: 2016-07-09
  Administered 2016-07-08 – 2016-07-09 (×15): 1500 [IU]/h via INTRAVENOUS
  Filled 2016-07-08 (×2): qty 250

## 2016-07-08 MED ORDER — HEPARIN 25,000 UNITS/250 ML 1/2 NS PREMIX INFUSION (~~LOC~~)
1800.0000 [IU]/h | INTRAMUSCULAR | Status: DC
Start: 2016-07-08 — End: 2016-07-08
  Administered 2016-07-08 (×6): 1800 [IU]/h via INTRAVENOUS
  Filled 2016-07-08: qty 250

## 2016-07-08 MED ORDER — THIAMINE HCL 100 MG/ML IJ SOLN
INTRAVENOUS | Status: DC
Start: 2016-07-08 — End: 2016-07-10
  Administered 2016-07-08 – 2016-07-09 (×2): via INTRAVENOUS
  Filled 2016-07-08 (×4): qty 1000

## 2016-07-08 MED ORDER — HEPARIN SODIUM (PORCINE) 5000 UNIT/ML IJ SOLN
INTRAMUSCULAR | Status: AC
Start: 2016-07-08 — End: 2016-07-08
  Filled 2016-07-08: qty 1

## 2016-07-08 NOTE — Interdisciplinary (Signed)
Pt requiring heavy sedation to stay asleep and calm t/o shift. Pt currently on Propofol and Fentanyl. No SBT at this time - Later this AM when pt is more calm and awake.

## 2016-07-08 NOTE — Progress Notes (Signed)
Progress Note  Surgical Intensive Care Unit    Patient Name: Jerome Hunt  MRN: [id number]     Admitting Service: [place] Attending Provider: Rae Halsted    Mechanism of Injury: <principal problem not specified>    Date: 07/08/16  Hospital Day:   2 days - Admitted on: 07/06/2016    Injuries/Problems: Problem List:  2017-12: Peripheral vascular disease (CMS-HCC)  2017-11: Peripheral artery disease (CMS-HCC)        SUBJECTIVE:     Overnight events: OR last night, bounding palp pulses post op; this AM only doppler and then gone from bypass; doppler AT present and ABI was 0.63. Pulse ox placed on foot and was 100% as well.     OBJECTIVE:   Physical Exam:  Intubated, sedated  intermiten tachy  No resp distress  abd soft ND  L groin pico in place; incision on LLE in tact; foot warm; cannot palpate pulse over graft; doppler AT  R foot with doppler DP        Vitals signs:     Latest Entry  Range (last 24 hours)    Temperature: 98.8 F (37.1 C)  Temp  Avg: 98.8 F (37.1 C)  Min: 98.6 F (37 C)  Max: 99 F (37.2 C)    Blood pressure (BP): 119/82  BP  Min: 112/70  Max: 128/96    Heart Rate: 109  Pulse  Avg: 95.5  Min: 83  Max: 112    Respirations: 14  Resp  Avg: 14.6  Min: 14  Max: 18    SpO2: 100 %  SpO2  Avg: 99.7 %  Min: 93 %  Max: 100 %       No Data Recorded       No Data Recorded       No Data Recorded     Weight: 99.5 kg (219 lb 5.7 oz)  Percentage Weight Change (%): -8.8 %    I&O:  Intake/Output       07/07/16 0600 - 07/08/16 0559 07/08/16 0600 - 07/09/16 0559      0600-1759 6270-3500 Total 0600-1759 9381-8299 Total       Intake    I.V.  2717.1  3641.5 6358.5  2040.7  -- 2040.7    Total Intake 2717.1 3641.5 6358.5 2040.7 -- 2040.7       Output    Urine  3385  1125 4510  505  -- 505    Emesis/NG Output  --  0 0  175  -- 175    Drains  --  50 50  35  -- 35    Total Output 3385 1175 4560 715 -- 715       Net I/O     -667.9 2466.5 1798.5 1325.7 -- 1325.7            Labs:  CBC  Recent Labs      07/06/16    1930   07/08/16   0519  07/08/16   1346   WBCCOUNT  17.8*   < >  11.6*  12.1*   HGB  12.3*   < >  8.9*  8.5*   PLCTEL  262   < >  168  167   BANDP  4.8   --    --    --    NEUTP   --    < >  68.5  63.3   LYMPP2  8.6   --    --    --  MONP2  1.9   --    --    --     < > = values in this interval not displayed.          Chemistry  Recent Labs      07/07/16   2204  07/08/16   0519   SODIUM  141  141   K  4.7  3.9   CL  108*  110*   CO2  26  26   BUN  8  9   CREAT  0.8  0.7   GLU  142*  132*   [place]  8.4*  7.8*   MG  2.1  2.1   PHOS  2.9  2.1*     Recent Labs      07/06/16   1930   ALK  57   AST  16   ALT  13   TBILI  0.3   ALB  4.4       Coags  Recent Labs      07/06/16   1930   07/07/16   0247  07/07/16   0830  07/08/16   1630   PTT  >200.0*   < >  24.9  43.9*  159.5*   INR  1.29*   --   1.08   --    --     < > = values in this interval not displayed.       Medications:  Scheduled Meds   aspirin  81 mg Daily    docusate  250 mg Daily    famotidine  20 mg Q12H    gabapentin  300 mg TID    influenza vaccine >=3 yrs  0.5 mL Prior to discharge    multivitamin adult  1 tablet Daily    povidone-iodine  1 Application BID    potassium chloride-thiamine-multivitamin-folic acid infusion builder   Q24H     PRN Meds   fentaNYL  25 mcg Q5 Min PRN    fentaNYL  25 mcg Q2H PRN    LORazepam  2 mg Q1H PRN    Or    LORazepam  4 mg Q1H PRN    ondansetron  4 mg Q6H PRN    propofol  30 mg Q3 Min PRN    propofol  30 mg Q2H PRN     IV Meds   dextrose-sodium chloride 5%-0.9% Stopped (07/08/16 1119)    fentaNYL 275 mcg/hr (07/08/16 0900)    heparin (porcine)      plasmalyte-A      propofol 60 mcg/kg/min (07/08/16 1743)       Allergies:  Allergies   Allergen Reactions    No Known Allergies [Other] Other     Received name: No Known Allergies       ASSESSMENT / PLAN / RECOMMENDATIONS:  15M with a traumatic left lower extremity injury 3 years prior to presents with pain to left lower extremity concerning for claudication     07/06/16  LEFT POPLITEAL TO DISTAL POSTERIOR TIBIAL ARTERY BYPASS LEFT SAPHENOUS VEIN HARVEST AORTOGRAM LEFT LEG ANGIOGRAM LEFT POPLITEAL ARTERY ANGIOPLASTY (4X4, 5X4) LEFT DISTAL POSTERIOR TIBIAL ARTERY ANGIOPLASTY       07/07/16  Redo Left transinterosseous below-knee popliteal-peroneal and sequential peroneal-distal posterior tibal-medial tarsal reverse saphenous vein bypass; Left distal segmental fibula resection to expose distal peroneal artery,  Thrombectomy of left below-knee popliteal-posterior tibial reverse saphenous vein bypass    Neuro:  GCS:  GCS 11T when off sedation  Mental  status: agitation off sedation  - on fent @ 225, prop @ 80  - wean off sedation as pt tolerates  - start CIWA for h/o etoh Abuse and withdrawal      CV:BP  Min: 112/70  Max: 128/96  Pulse  Avg: 95.5  Min: 83  Max: 112  Pressors: none  Cardiac meds: none  Echo: n/a  - HDS    #LLE claudication s/p bypass   - bypass likely thrombosed again  - ABI with AT on LLE is 0.63  - heparin gtt  - q1 neurovscular checks  - pulse ox monitor on LLE (currently at 100%)   - stat arterial duplex  - f/u CK q8    Pulmonary:  - unable to extubate due to severe agitation when coming off sedation  - on [facility] 14/2040/5  - CXR clean  - SBT daily and attempt to extubate                FEN/GI:  Abd exam: soft  Diet:NPO Reason: Surgery/Procedure; Tube Feed: Hold while NPO (if ordered)    Renal:     Intake/Output Summary (Last 24 hours) at 07/08/16 1752  Last data filed at 07/08/16 1600   Gross per 24 hour   Intake          5682.14 ml   Output             1890 ml   Net          3792.14 ml       UOP: adequate at 2.5L        Hematology:   hb 8.9 (10.6)  -- hep gtt  - f/u CBC 2pm      ID: Temp  Avg: 98.8 F (37.1 C)  Min: 98.6 F (37 C)  Max: 99 F (37.2 C)  Micro: none  Abx: none  Lines, Drains and Wounds:  Patient Lines/Drains/Airways Status    Active PICC Line / CVC Line / PIV Line / Drain / Airway / Intraosseous  Line / Epidural Line / ART Line / Line Type / Wound     Name: Placement date: Placement time: Site: Days:    Peripheral IV - 20 G Left Arm 07/06/16   0956   Arm   2    Peripheral IV - 18 G 07/06/16   2100      1    Closed/Suction Drain -  Left;Medial Knee 07/07/16   1958   Knee   less than 1    NG/OG Tube -  Orogastric 12 fr Right mouth 07/06/16   2200   Right mouth   1    Indwelling Urinary Catheter -  07/06/16 1029 RN Standard (Latex) 14 fr 10 ml Yes 07/06/16   1029   Standard (Latex)   2    Wound Vac -  Surgical Left Groin 07/07/16   2000    less than 1    Non-Surgical Airway -  07/06/16   1031      2    Arterial Line (ART) -  Left Radial 07/06/16   1700   Radial   2    Incision -  07/06/16 1155 Groin Right 07/06/16   1155    2    Incision -  07/06/16 1205 Leg Left;Medial 07/06/16   1205    2    Incision -  07/07/16 Leg Left;Lateral;Distal;Proximal 07/07/16       1  Endocrine:  FSBS: controlled 130-140    No results for input(s): GLUPOC in the last 72 hours.    MSK:  PT/OT:  No  Weight Bearing Status:  weight bear as tolerated      ICU Care:    Feedings: No  Analgesia: Yes  Sedation: Yes  Thromboprophylaxis:SCDs, early mobility protocol and subcutaneous heparin  Head-of-bed elevation: Yes  Ulcer prophylaxis: Yes  Glycemic control: Yes  GI Bowel care: Yes  Indwelling catheter removal: n/a  ID: de-escalation of Abx:No  Spontaneous breathing trial: N/A    Family discussion: yes    Recommendations:  - cont hep gtt  - q1 neurovascular checks and pulse ox on the LLE  - stat arterial duplex  - repeat CBC 2pm  - f/u CK q8  - CIWA protocol  - extubate once less agitated off sedation    DISPO: SICU    Versie Starks, MD  General surgery, PGY-3  07/08/16 5:52 PM    SICU Attending Attestation:    Lance Muss in SICU overnight secondary to above diagnoses and due to concern for possible sudden deterioration. Critical care time >30 minutes today, excluding teaching and procedures.   Patient met criteria for critical illness and ICU level of care during my evaluation and treatment due to the following diagnoses and/or management issues:    1. DTs  2. Altered MS  3. Malnutrition  4. Pulmonary failuree  5. Vent Management      Alfonso Patten, MD  HS Clinical Professor  Department of Surgery

## 2016-07-08 NOTE — Anesthesia Postprocedure Evaluation (Signed)
Anesthesia Post Note    Patient: Jerome Hunt    Procedure(s) Performed: Procedure(s):  Aortoiliac and selective left lower extremity angiography; Ultrasound guided right common femoral artery needle entry and third order catheter placement x 2  Redo Left transinterosseous below-knee popliteal-peroneal and sequential peroneal-distal posterior tibal-medial tarsal reverse saphenous vein bypass; Left distal segmental fibula resection to expose distal peroneal artery  Thrombectomy of left below-knee popliteal-posterior tibial reverse saphenous vein bypass      Final anesthesia type: General    Patient location: ICU    Post anesthesia pain: adequate analgesia    Mental status: sedated and unresponsive    Airway Patent: Yes, with endotracheal tube in place    Last Vitals:   Vitals:    07/08/16 0103   BP:    Pulse: 87   Resp: 14   Temp:    SpO2: 100%       Post vital signs: stable    Hydration: adequate    N/V:no    Were two or more anti-emetics used?: No, patient remains intubated post-operatively    Disposal of controlled substances: All controlled substances during the case accounted for and disposed of per hospital policy    Plan of care per primary team.

## 2016-07-08 NOTE — Plan of Care (Signed)
Problem: Promotion of health and safety  Goal: Promotion of Health and Safety  The patient remains safe, receives appropriate treatment and achieves optimal outcomes (physically, psychosocially, and spiritually) within the limitations of the disease process by discharge.   Outcome: Unable to meet goal at this time.      Comments:   Nursing Shift Summary    Provider Notification for the past 12 hrs:   Provider Name Reason for Communication Provider Response   07/08/16 0445 Dr. Simonne MartinetHodad Naderi Dr. at bedside to assess Doppler and vascular check sites. Aware that they are present but decreased from before. Aware that pt has repeatedly become severely agitated and needed multiple boluses of Propofol and Fentanyl. Propofol remains maxed and fentanyl drip almost maxed. Continue to monitor the Doppler and vascular check sites and notify MD if changes.   07/08/16 [id number] Dr Laveda NormanH Naderi MD notified unable to palpate previously palpable pulses, only able to doppler now continue to monitor pulses, no new orders received.        No data found.      No data found.    Shift Comments for the past 12 hrs:   Comments   07/08/16 0700 Dr. Lissa MerlinKuo at bedside, MD notified unable to palpate pulses on LLE that were previously palpable, however able to doppler all pulses, pt assessed by MD, no new orders received at present time.

## 2016-07-08 NOTE — Progress Notes (Signed)
Progress Note    Patient Name: Jerome Hunt  MRN: [id number]  Room#: 6232/6232-01    Service: [place] Attending Provider: Wyn Forster, MD    Mechanism of Injury: LLE arterial insufficiency    Date: 07/08/16  Hospital Day:   2 days - Admitted on: 07/06/2016  Post Operative Day(s): 1 Day Post-Op    left lower extremity angioplasty, left popliteal-distal posterior tibial bypass with LLE GSV harvest 07/06/16  Revision left transinterossesous below-knee popliteal-peroneal and sequential peroneal-distal posterior tibial-medial tarsal reverse saphenous vein bypass  Left distal segmental fibula resection to expose distal peroneal artery, thrombectomy of left below-knee popliteal-posterior tibial reverse saphenous vein bypass 07/07/16    SUBJECTIVE: Jerome Hunt is a 40 year old ex smoker with a history of traumatic left lower extremity injury 3 years ago underwent left lower extremity angioplasty, left popliteal-distal posterior tibial bypass with LLE GSV harvest 07/06/16 by Dr Rae Halsted    Overnight events: Patient required return to OR emergently, above procedure and was admitted to SICU post-operatively for vascular checks and post-operative management. Remains intubated. Restarted heparin gtt after signals no longer palpable.    OBJECTIVE:     GENERAL: intubated, sedated  HEENT: NC/AT EOMI MMM ET tube and NGT in place  NECK: supple  ABDOMEN: soft, non-distended  GROIN: no evidence of hematoma at right femoral access site, pico dressing in place left groin  EXTREMITY: LLE with dressing c/d/i,  dopplerable pulses  LLE drain in place, serosanguinous output  LLE warm to touch, palpable pulses RLE  LUE a line in place  Foley catheter in place, clear urine output      Lines, Drains and Airways:  Peripheral IV - 20 G Left Arm (Active)   Site Assessment Phlebitis 0 07/07/2016  4:00 AM   Line Lumen Status Infusing 07/07/2016  4:00 AM   Dressing Transparent 07/07/2016  4:00 AM    Dressing Status Clean, dry, intact 07/07/2016  4:00 AM       Peripheral IV - 18 G (Active)   Site Assessment Phlebitis 0 07/07/2016  4:00 AM   Line Lumen Status Infusing 07/07/2016  4:00 AM   Dressing Transparent 07/07/2016  4:00 AM   Dressing Status Clean, dry, intact 07/07/2016  4:00 AM       Arterial Line (ART) -  Left Radial (Active)   Site Assessment Phlebitis 0 07/07/2016  4:00 AM   Line Lumen Status Patent 07/07/2016  4:00 AM   Treatment Arm board on 07/07/2016  4:00 AM   Dressing Transparent 07/07/2016  4:00 AM   Dressing Status Clean, dry, intact 07/07/2016  4:00 AM       Vitals signs:     Latest Entry  Range (last 24 hours)    Temperature: 98.8 F (37.1 C)  Temp  Avg: 98.9 F (37.2 C)  Min: 98.8 F (37.1 C)  Max: 99 F (37.2 C)    Blood pressure (BP): 123/74  BP  Min: 112/70  Max: 128/96    Heart Rate: 83  Pulse  Avg: 90.9  Min: 83  Max: 105    Respirations: 14  Resp  Avg: 14.6  Min: 14  Max: 18    SpO2: 100 %  SpO2  Avg: 99.4 %  Min: 96 %  Max: 100 %       No Data Recorded       No Data Recorded       No Data Recorded     Weight: 99.5 kg (219 lb 5.7 oz)  Percentage Weight Change (%): -8.8 %    Labs:  CBC  Recent Labs      07/07/16   2204  07/08/16   0519   HGB  10.6*  8.9*   HCT  30.7*  25.6*        Chemistry  Recent Labs      07/07/16   2202  07/07/16   2204  07/08/16   0449  07/08/16   0519   K   --   4.7   --   3.9   CL   --   108*   --   110*   BICARB  25.8   --   26.7   --    BUN   --   8   --   9   CREAT   --   0.8   --   0.7   GLU   --   142*   --   132*   [place]   --   8.4*   --   7.8*   MG   --   2.1   --   2.1   PHOS   --   2.9   --   2.1*     Recent Labs      07/06/16   1930   ALK  57   AST  16   ALT  13   TBILI  0.3   ALB  4.4       Coags  Recent Labs      07/06/16   1930   07/07/16   0247  07/07/16   0830   PTT  >200.0*   < >  24.9  43.9*   INR  1.29*   --   1.08   --     < > = values in this interval not displayed.       Medications:  Scheduled Meds   aspirin  81 mg Daily    docusate  250 mg Daily     famotidine  20 mg Daily    gabapentin  300 mg TID    heparin  8,000 Units Once    influenza vaccine >=3 yrs  0.5 mL Prior to discharge    povidone-iodine  1 Application BID     PRN Meds   fentaNYL  25 mcg Q5 Min PRN    fentaNYL  25 mcg Q2H PRN    ondansetron  4 mg Q6H PRN    propofol  30 mg Q3 Min PRN    propofol  30 mg Q2H PRN     IV Meds   dextrose-sodium chloride 5%-0.9% 150 mL/hr at 07/08/16 0014    fentaNYL 275 mcg/hr (07/08/16 0600)    heparin (porcine)      plasmalyte-A      propofol 80 mcg/kg/min (07/08/16 0277)       Allergies:  Allergies   Allergen Reactions    No Known Allergies [Other] Other     Received name: No Known Allergies       ASSESSMENT / PLAN / RECOMMENDATIONS:  Neuro:  Intubated and sedated   Fentanyl/propofol gtt    CV:BP  Min: 112/70  Max: 128/96  Pulse  Avg: 90.9  Min: 83  Max: 105  Continue to monitor  q1h vascular checks- pulses now only dopplerable will begin heparin gtt    Pulmonary:Resp  Avg: 14.6  Min: 14  Max: 18  SpO2  Avg: 99.4 %  Min:  96 %  Max: 100 %    Intubated  Acidosis improving                 FEN/GI:  NPO while intubated  NGT in place  GI PPX    Renal:   Intake/Output       07/07/16 0600 - 07/08/16 0559 07/08/16 0600 - 07/09/16 0559      0600-1759 1800-0559 Total 0600-1759 1800-0559 Total       Intake    I.V.  2717.1  3641.5 6358.5  325.2  -- 325.2    Total Intake 2717.1 3641.5 6358.5 325.2 -- 325.2       Output    Urine  3385  1125 4510  100  -- 100    Emesis/NG Output  --  0 0  --  -- --    Drains  --  50 50  --  -- --    Total Output 3385 1175 4560 100 -- 100       Net I/O     -667.9 2466.5 1798.5 225.2 -- 225.2        Trend CK    ID: Temp  Avg: 98.9 F (37.2 C)  Min: 98.8 F (37.1 C)  Max: 99 F (37.2 C)  Received peri-op abx  WBC 11.6  Blood cx negative x1day    Hematology:  H/H stable  Heparin gtt    Endocrine:  Monitor blood sugars    Prophylaxis:  SCD RLE   Restart Heparin gtt  GI ppx  Bowel regimen    Dispo:  SICU     I attest that I had personally interviewed and examined Jerome Hunt during this encounter and have reviewed all of the available diagnostic studies along with the resident physician.  IHilma Favors, MD, agree with the recorded history, physical examination and evaluation above and have supervised formulation of the stated impression, recommendations and care plan.  Start heparin drip. No vascular intervention at this time.   Appreciate ICU care for vent management.    Hilma Favors, MD  07/08/2016 1500

## 2016-07-08 NOTE — Progress Notes (Signed)
Pt seen and examined  S/p left popliteal to distal PT bypass  S/p Bypass revision, left popliteal to peroneal and peroneal to PT jump graft  I came in to examine pt due to concern bypass is occluded.   We are unable to get stat arterial duplex today due to ultrasound staffing issue  Pt with 100% oxygen saturation with  AT doppler signal.  Faint intermittment bypass graft signal, likely thrombosed.    I spoke with Jerome Contrasachel.  I explained that both bypass have failed likely due to poor outflow from small distal vessels.   Pt remains intubated.  New revelation during this hospital admission is that [name] likely drinks heavily at home.   This likely explains his agitation and difficulty with anesthesia during first operation.       Will plan to reassess extubation criteria tomrrow.    Pain mgmt  Therapeutic anticoagulation has started  Will obtain anesthesia pain consult  DT prophylaxis  Incision CDI  Monitor drain output

## 2016-07-08 NOTE — Progress Notes (Signed)
Inpatient Heparin Drug Monitoring Note    Jerome Hunt is a 40 year old male on anticoagulation therapy for left popliteal-distal posterior tibial bypass with LLE GSV harvest 07/06/16 . Patient received heparin 29,000 units in incremental boluses intra-op during surgery yesterday with PTT>200 at 21:90 on 12/1. Patient returned to OR emergently last night and heparin drip was held on 12/2. Dr. Coralie CommonBrian Sheehan request to restart heparin with bolus this morning.    PMH:  Traumatic left lower extremity 2014  Claudication    aPTT Goal:   65 - 90 seconds    Dosing weight = 99.5kg     Current Heparin Rate:     Dose (Units/hr) Heparin: 1800 Units/hr      Labs:    PTT (last 24 hours):  -43.9 @0830  on 12/2    Labs:  (Last 3 days):    Recent Labs      07/06/16   1930   07/07/16   0247  07/07/16   2204  07/08/16   0519   HGB  12.3*   --    --   10.6*  8.9*   HCT  37.5*   --    --   30.7*  25.6*   CREAT  1.1   < >  0.9  0.8  0.7    < > = values in this interval not displayed.       Labs (Last values):  Albumin   Date Value Ref Range Status   07/06/2016 4.4 4.2 - 5.5 G/DL Final     AST   Date Value Ref Range Status   07/06/2016 16 13 - 39 U/L Final     ALT   Date Value Ref Range Status   07/06/2016 13 7 - 52 U/L Final     Bilirubin, Total   Date Value Ref Range Status   07/06/2016 0.3 0.0 - 1.4 mg/dL Final       Drug/Disease Interactions and Assessments:    - Other Anticoagulants:   Current Facility-Administered Medications   Medication Dose Frequency    heparin (porcine)  1,800 Units/hr Continuous       - Bleeding Assessment:  H/H is stable. Patient does not exhibit any signs or symptoms of bleeding (spoke to RN).     - Current Nutrition Assessment:   Recent changes in nutrition/diet? no  NPO Reason: Surgery/Procedure; Tube Feed: Hold while NPO (if ordered)    Assessment/Plan:  - Will bolus heparin 8000 units and start maintenance infusion to 1800 units/hour   - Check aPTT every 8 hours, to be drawn at 16:00   - Continue to monitor for signs and symptoms of bleeding    Pharmacy will continue to monitor closely. For questions please call [phone]    Frutoso SchatzKathleen Thai Ansel BongBuu Kayon Dozier, [facility] Center[facility]ARMD

## 2016-07-08 NOTE — Plan of Care (Signed)
Problem: Promotion of health and safety  Goal: Promotion of Health and Safety  The patient remains safe, receives appropriate treatment and achieves optimal outcomes (physically, psychosocially, and spiritually) within the limitations of the disease process by discharge.   Outcome: Progressing toward goal, anticipate improvement over: next 12-24 hours   07/07/16 1830 07/08/16 0807 07/08/16 1817   Adult/Peds Plan of Care   Standard of Care/Policy --  --  [facility]   Outcome Evaluation (rationale for progressing/not progessing --  pt pulses to LLE decrease but still + with doppler, MD aware, respiratory status stable, still requiring a lot of sedation --    Patient Specific Goal, Promotion of Health & Safety --  maintain positive pulse to LLE, maintain airway --    Additional Individualized Interventions/Recommendations  --  contiue to monitor pulses and airway, assure pt safety and comfort.  --    Additional Individualized Interventions/Recommendations Q2 hour turns and protect skin.  --  --        Comments:   Nursing Shift Summary    Provider Notification for the past 12 hrs:   Provider Name Reason for Communication Provider Response   07/08/16 0650 Dr Laveda NormanH Naderi MD notified unable to palpate previously palpable pulses, only able to doppler now continue to monitor pulses, no new orders received.    07/08/16 1745 - - gtt helpd for one hour restarted at 1845 at 1500u/ hour       Critical Value Notification for the past 12 hrs:   Lab Test Name Test Result Provider Name   07/08/16 1745 aPTT 159 -         No data found.    Shift Comments for the past 12 hrs:   Comments   07/08/16 0700 Dr. Lissa MerlinKuo at bedside, MD notified unable to palpate pulses on LLE that were previously palpable, however able to doppler all pulses, pt assessed by MD, no new orders received at present time.

## 2016-07-08 NOTE — Interdisciplinary (Signed)
Pt arrived from OR via BMV by OR MD. Pt had no complications at this time from BMV to Vent in SICU. Pt sedated and restrained with no signs of SOB or distress t/o transport.

## 2016-07-09 ENCOUNTER — Inpatient Hospital Stay: Payer: No Typology Code available for payment source

## 2016-07-09 DIAGNOSIS — J9601 Acute respiratory failure with hypoxia: Secondary | ICD-10-CM

## 2016-07-09 DIAGNOSIS — D62 Acute posthemorrhagic anemia: Secondary | ICD-10-CM

## 2016-07-09 LAB — CBC WITH DIFF, BLOOD
ANC automated: 6.5 10*3/uL (ref 2.0–8.1)
ANC automated: 5.9 10*3/uL (ref 2.0–8.1)
ANC automated: 10.1 10*3/uL — ABNORMAL HIGH (ref 2.0–8.1)
Basophils %: 0.6 %
Basophils %: 0.5 %
Basophils %: 0.6 %
Basophils Absolute: 0.1 10*3/uL (ref 0.0–0.2)
Basophils Absolute: 0 10*3/uL (ref 0.0–0.2)
Basophils Absolute: 0.1 10*3/uL (ref 0.0–0.2)
Eosinophils %: 2 %
Eosinophils %: 2.4 %
Eosinophils %: 2.1 %
Eosinophils Absolute: 0.2 10*3/uL (ref 0.0–0.5)
Eosinophils Absolute: 0.2 10*3/uL (ref 0.0–0.5)
Eosinophils Absolute: 0.3 10*3/uL (ref 0.0–0.5)
Hematocrit: 23.5 % — ABNORMAL LOW (ref 39.5–50.0)
Hematocrit: 21.3 % — ABNORMAL LOW (ref 39.5–50.0)
Hematocrit: 21.1 % — ABNORMAL LOW (ref 39.5–50.0)
Hgb: 8 G/DL — ABNORMAL LOW (ref 13.5–16.9)
Hgb: 7.2 G/DL — ABNORMAL LOW (ref 13.5–16.9)
Hgb: 7.1 G/DL — ABNORMAL LOW (ref 13.5–16.9)
Lymphocytes %: 30.4 %
Lymphocytes %: 24.4 %
Lymphocytes %: 15.6 %
Lymphocytes Absolute: 3.5 10*3/uL — ABNORMAL HIGH (ref 0.9–3.3)
Lymphocytes Absolute: 2.2 10*3/uL (ref 0.9–3.3)
Lymphocytes Absolute: 2.1 10*3/uL (ref 0.9–3.3)
MCH: 32.9 PG (ref 27.0–33.5)
MCH: 32.4 PG (ref 27.0–33.5)
MCH: 32.5 PG (ref 27.0–33.5)
MCHC: 34.1 G/DL (ref 32.0–35.5)
MCHC: 33.8 G/DL (ref 32.0–35.5)
MCHC: 33.8 G/DL (ref 32.0–35.5)
MCV: 96.6 FL (ref 81.5–97.0)
MCV: 95.6 FL (ref 81.5–97.0)
MCV: 96 FL (ref 81.5–97.0)
MPV: 10.3 FL (ref 7.2–11.7)
MPV: 10.1 FL (ref 7.2–11.7)
MPV: 9.8 FL (ref 7.2–11.7)
Monocytes %: 10 %
Monocytes %: 7.7 %
Monocytes %: 7.1 %
Monocytes Absolute: 1.1 10*3/uL — ABNORMAL HIGH (ref 0.0–0.8)
Monocytes Absolute: 0.7 10*3/uL (ref 0.0–0.8)
Monocytes Absolute: 1 10*3/uL — ABNORMAL HIGH (ref 0.0–0.8)
Neutrophils % (A): 57 %
Neutrophils % (A): 65 %
Neutrophils % (A): 74.6 %
PLT Count: 158 10*3/uL (ref 150–400)
PLT Count: 142 10*3/uL — ABNORMAL LOW (ref 150–400)
PLT Count: 141 10*3/uL — ABNORMAL LOW (ref 150–400)
RBC: 2.44 10*6/uL — ABNORMAL LOW (ref 3.70–5.00)
RBC: 2.23 10*6/uL — ABNORMAL LOW (ref 3.70–5.00)
RBC: 2.2 10*6/uL — ABNORMAL LOW (ref 3.70–5.00)
RDW-CV: 15.3 % — ABNORMAL HIGH (ref 11.6–14.4)
RDW-CV: 14.8 % — ABNORMAL HIGH (ref 11.6–14.4)
RDW-CV: 14.6 % — ABNORMAL HIGH (ref 11.6–14.4)
White Bld Cell Count: 11.4 10*3/uL — ABNORMAL HIGH (ref 4.0–10.5)
White Bld Cell Count: 9.1 10*3/uL (ref 4.0–10.5)
White Bld Cell Count: 13.5 10*3/uL — ABNORMAL HIGH (ref 4.0–10.5)

## 2016-07-09 LAB — ARTERIAL BLOOD GAS
Base Excess: 0.2 mmol/L
Base Excess: 0.3 mmol/L
Base Excess: 2 mmol/L
Base Excess: 2.6 mmol/L
HCO3: 26.9 mmol/L (ref 21.0–27.0)
HCO3: 27.2 mmol/L — ABNORMAL HIGH (ref 21.0–27.0)
HCO3: 27.2 mmol/L — ABNORMAL HIGH (ref 21.0–27.0)
HCO3: 27.9 mmol/L — ABNORMAL HIGH (ref 21.0–27.0)
Inspired O2: 0.4
Inspired O2: 0.6
Inspired O2: 0.4
Inspired O2: 0.4
O2 Sat: 99.2 % — ABNORMAL HIGH (ref 95.0–99.0)
O2 Sat: 99.5 % — ABNORMAL HIGH (ref 95.0–99.0)
O2 Sat: 99.4 % — ABNORMAL HIGH (ref 95.0–99.0)
O2 Sat: 98.6 % (ref 95.0–99.0)
Patient Temp: 36.2 DEG C
Patient Temp: 37.8 DEG C
Patient Temp: 37.5 DEG C
Patient Temp: 37.5 DEG C
pCO2 (Temp Adjusted): 49.9 MMHG — ABNORMAL HIGH (ref 36.0–42.0)
pCO2 (Temp Adjusted): 58.9 MMHG — ABNORMAL HIGH (ref 36.0–42.0)
pCO2 (Temp Adjusted): 47 MMHG — ABNORMAL HIGH (ref 36.0–42.0)
pCO2 (Temp Adjusted): 48.3 MMHG — ABNORMAL HIGH (ref 36.0–42.0)
pH (Temp Adjusted): 7.35 — ABNORMAL LOW (ref 7.38–7.42)
pH (Temp Adjusted): 7.29 — ABNORMAL LOW (ref 7.38–7.42)
pH (Temp Adjusted): 7.38 (ref 7.38–7.42)
pH (Temp Adjusted): 7.38 (ref 7.38–7.42)
pO2 (Temp Adjusted): 157.3 MMHG — ABNORMAL HIGH (ref 80.0–104.0)
pO2 (Temp Adjusted): 185.4 MMHG — ABNORMAL HIGH (ref 80.0–104.0)
pO2 (Temp Adjusted): 174.8 MMHG — ABNORMAL HIGH (ref 80.0–104.0)
pO2 (Temp Adjusted): 171.9 MMHG — ABNORMAL HIGH (ref 80.0–104.0)

## 2016-07-09 LAB — BASIC METABOLIC PANEL, BLOOD
BUN: 10 mg/dL (ref 7–25)
CO2: 27 mmol/L (ref 21–31)
Calcium: 7.8 mg/dL — ABNORMAL LOW (ref 8.6–10.3)
Chloride: 108 mmol/L — ABNORMAL HIGH (ref 98–107)
Creat: 0.8 mg/dL (ref 0.7–1.3)
Electrolyte Balance: 5 mmol/L (ref 2–12)
Glucose: 122 mg/dL — ABNORMAL HIGH (ref 70–115)
Potassium: 3.7 mmol/L (ref 3.5–5.1)
Sodium: 140 mmol/L (ref 136–145)
eGFR - high estimate: >60 (ref >59)
eGFR - low estimate: >60 (ref >59)

## 2016-07-09 LAB — CPK-CREATINE PHOSPHOKINASE, BLOOD
CK: 1451 U/L — ABNORMAL HIGH (ref 30–223)
CK: 1496 U/L — ABNORMAL HIGH (ref 30–223)
CK: 1771 U/L — ABNORMAL HIGH (ref 30–223)

## 2016-07-09 LAB — APTT, BLOOD
PTT: 83.1 s — ABNORMAL HIGH (ref 24.1–36.3)
PTT: 98.6 s (ref 24.1–36.3)
PTT: 32.7 s (ref 24.1–36.3)
PTT: 29 s (ref 24.1–36.3)

## 2016-07-09 LAB — PHOSPHORUS, BLOOD: Phosphorus: 3.3 MG/DL (ref 2.5–5.0)

## 2016-07-09 LAB — MAGNESIUM, BLOOD: Magnesium: 2.1 mg/dL (ref 1.9–2.7)

## 2016-07-09 LAB — PTT CRT VAL CALL

## 2016-07-09 LAB — ACT K (ISTAT) POINT OF CARE TESTING: ACT K (iSTAT) POC: 175 SEC. — ABNORMAL HIGH (ref 74–137)

## 2016-07-09 MED ORDER — SODIUM CHLORIDE 0.9 % IV SOLN
0.0000 ug/kg/h | INTRAVENOUS | Status: DC
Start: 2016-07-09 — End: 2016-07-12
  Administered 2016-07-09: 1.2 ug/kg/h via INTRAVENOUS
  Administered 2016-07-09: 1 ug/kg/h via INTRAVENOUS
  Administered 2016-07-09: 1.4 ug/kg/h via INTRAVENOUS
  Administered 2016-07-09: 0.8 ug/kg/h via INTRAVENOUS
  Administered 2016-07-09: 1.6 ug/kg/h via INTRAVENOUS
  Administered 2016-07-09: 2 ug/kg/h via INTRAVENOUS
  Administered 2016-07-09: 1.6 ug/kg/h via INTRAVENOUS
  Administered 2016-07-09: 0.8 ug/kg/h via INTRAVENOUS
  Administered 2016-07-09 (×2): 1.6 ug/kg/h via INTRAVENOUS
  Administered 2016-07-09: 2 ug/kg/h via INTRAVENOUS
  Administered 2016-07-09: 1.8 ug/kg/h via INTRAVENOUS
  Administered 2016-07-09: 1 ug/kg/h via INTRAVENOUS
  Administered 2016-07-09: 1.602 ug/kg/h via INTRAVENOUS
  Administered 2016-07-09: 1.6 ug/kg/h via INTRAVENOUS
  Administered 2016-07-10: 1 ug/kg/h via INTRAVENOUS
  Administered 2016-07-10: 1.2 ug/kg/h via INTRAVENOUS
  Administered 2016-07-10 (×2): 1 ug/kg/h via INTRAVENOUS
  Administered 2016-07-10: 0.8 ug/kg/h via INTRAVENOUS
  Administered 2016-07-10 (×2): 1 ug/kg/h via INTRAVENOUS
  Administered 2016-07-10: 0.8 ug/kg/h via INTRAVENOUS
  Administered 2016-07-10 (×9): 1 ug/kg/h via INTRAVENOUS
  Administered 2016-07-10 (×2): 1.2 ug/kg/h via INTRAVENOUS
  Administered 2016-07-10: 1 ug/kg/h via INTRAVENOUS
  Administered 2016-07-10: 0.8 ug/kg/h via INTRAVENOUS
  Administered 2016-07-10: 1.2 ug/kg/h via INTRAVENOUS
  Administered 2016-07-10 (×2): 0.8 ug/kg/h via INTRAVENOUS
  Administered 2016-07-10 (×2): 1 ug/kg/h via INTRAVENOUS
  Administered 2016-07-11 (×3): 2 ug/kg/h via INTRAVENOUS
  Administered 2016-07-11: 1.4 ug/kg/h via INTRAVENOUS
  Administered 2016-07-11 (×13): 2 ug/kg/h via INTRAVENOUS
  Administered 2016-07-11: 1.6 ug/kg/h via INTRAVENOUS
  Administered 2016-07-11 (×7): 2 ug/kg/h via INTRAVENOUS
  Administered 2016-07-11: 1.4 ug/kg/h via INTRAVENOUS
  Administered 2016-07-11: 1.8 ug/kg/h via INTRAVENOUS
  Administered 2016-07-11 – 2016-07-12 (×14): 2 ug/kg/h via INTRAVENOUS
  Filled 2016-07-09 (×25): qty 4
  Filled 2016-07-09 (×2): qty 2
  Filled 2016-07-09 (×4): qty 4

## 2016-07-09 MED ORDER — ASPIRIN 300 MG RE SUPP
81.0000 mg | Freq: Every day | RECTAL | Status: DC
Start: 2016-07-10 — End: 2016-07-09

## 2016-07-09 MED ORDER — HEPARIN SODIUM (PORCINE) 1000 UNIT/ML IJ SOLN
8000.00 [IU] | Freq: Once | INTRAMUSCULAR | Status: AC
Start: 2016-07-09 — End: 2016-07-09
  Administered 2016-07-09 (×2): 8000 [IU] via INTRAVENOUS
  Filled 2016-07-09: qty 8

## 2016-07-09 MED ORDER — HYDROMORPHONE 50 MG/100 ML PREMIX PCA INFUSION - DEMAND ONLY (~~LOC~~)
INTRAVENOUS | Status: DC
Start: 2016-07-09 — End: 2016-07-10
  Administered 2016-07-09: 18:00:00 via INTRAVENOUS
  Filled 2016-07-09: qty 100

## 2016-07-09 MED ORDER — PROPOFOL 1000 MG/100ML IV EMUL
INTRAVENOUS | Status: AC
Start: 2016-07-09 — End: 2016-07-09
  Administered 2016-07-09 (×2): 70 ug/kg/min via INTRAVENOUS
  Filled 2016-07-09: qty 100

## 2016-07-09 MED ORDER — PROPOFOL 1000 MG/100 ML INFUSION (~~LOC~~)
0.0000 ug/kg/min | INTRAVENOUS | Status: DC
Start: 2016-07-09 — End: 2016-07-09
  Administered 2016-07-09: 70 ug/kg/min via INTRAVENOUS
  Administered 2016-07-09: 30 ug/kg/min via INTRAVENOUS
  Administered 2016-07-09: 10 ug/kg/min via INTRAVENOUS
  Administered 2016-07-09: 60 ug/kg/min via INTRAVENOUS
  Administered 2016-07-09: 40 ug/kg/min via INTRAVENOUS
  Administered 2016-07-09: 20 ug/kg/min via INTRAVENOUS
  Administered 2016-07-09: 50 ug/kg/min via INTRAVENOUS

## 2016-07-09 MED ORDER — SODIUM CHLORIDE 0.9 % IV SOLN
0.0000 ug/kg/h | INTRAVENOUS | Status: DC
Start: 2016-07-09 — End: 2016-07-09
  Administered 2016-07-09: 1.6 ug/kg/h via INTRAVENOUS
  Administered 2016-07-09: 1.2 ug/kg/h via INTRAVENOUS
  Administered 2016-07-09: 1.4 ug/kg/h via INTRAVENOUS
  Administered 2016-07-09: 1 ug/kg/h via INTRAVENOUS
  Administered 2016-07-09: 0.8 ug/kg/h via INTRAVENOUS
  Administered 2016-07-09: 1.8 ug/kg/h via INTRAVENOUS
  Administered 2016-07-09: 13:00:00 0.8 ug/kg/h via INTRAVENOUS
  Filled 2016-07-09 (×2): qty 2

## 2016-07-09 MED ORDER — FUROSEMIDE 10 MG/ML IJ SOLN
20.00 mg | Freq: Once | INTRAMUSCULAR | Status: AC
Start: 2016-07-09 — End: 2016-07-09
  Administered 2016-07-09 (×2): 20 mg via INTRAVENOUS
  Filled 2016-07-09: qty 2

## 2016-07-09 MED ORDER — HEPARIN 25,000 UNITS/250 ML 1/2 NS PREMIX INFUSION (~~LOC~~)
1500.0000 [IU]/h | INTRAMUSCULAR | Status: DC
Start: 2016-07-09 — End: 2016-07-10
  Administered 2016-07-09 – 2016-07-10 (×11): 1500 [IU]/h via INTRAVENOUS
  Filled 2016-07-09 (×2): qty 250

## 2016-07-09 NOTE — Interdisciplinary (Signed)
Social Work Assessment, Adult        Patient Name:  Jerome Hunt   MRN: [id number]   Date of Birth: 1975-08-30    Age: 40 year old   Date of Admission:  07/06/2016         Service Date: July 09, 2016     Assessment  Assessment Type: Initial   Referral Information  Referral Type: Supportive Counseling;Other (Comment) (Alcohol/Drug Counseling/ Resources )     Pt is a 40 year old, married male. Per chart, pt with a traumatic left lower extremity injury 3 years prior and [facility] pain to left lower extremity concerning for claudication.     CSW responding to SW consult re: alcohol/drug counseling/supportive counseling/resources. Per chart, pt currently intubated and sedated; labs work up results with pt testing positive for Benzo, opiates, and THC.    CSW conferred with bedside RN Emerson MonteMelissa Tran x [phone] and confirms pt remains intubated/sedated and MD team will attempt to extubate pt today. Per RN, wife has been available by phone and anticipates to visit pt later this date.     CSW unable to address above referral at this time and will attempt to assess psychosocial needs when pt is alert/oriented.     CSW to remain avialable should needs arise. Jerome Hunt, MSW p [phone]                                                                                       Discharge Information:                Jerome Hunt, MSW     Date: 07/09/2016    Time: 1:26 PM

## 2016-07-09 NOTE — Plan of Care (Signed)
Problem: Promotion of health and safety  Goal: Promotion of Health and Safety  The patient remains safe, receives appropriate treatment and achieves optimal outcomes (physically, psychosocially, and spiritually) within the limitations of the disease process by discharge.   Outcome: Progressing toward goal, anticipate improvement over: next 12-24 hours   07/09/16 0635   Adult/Peds Plan of Care   Standard of Care/Policy Critical Care SOC;[facility];Restraints SOC;Falls reduction   Outcome Evaluation (rationale for progressing/not progessing extubate patient   Patient Specific Goal, Promotion of Health & Safety control patient's pain once extubated     Pulses on bilateral extremities dopplered Q1H.  Dopplers were found each time

## 2016-07-09 NOTE — Plan of Care (Signed)
Problem: Promotion of health and safety  Goal: Promotion of Health and Safety  The patient remains safe, receives appropriate treatment and achieves optimal outcomes (physically, psychosocially, and spiritually) within the limitations of the disease process by discharge.   Outcome: Progressing toward goal, anticipate improvement over: Next 24-48 hours   07/09/16 1828   Adult/Peds Plan of Care   Standard of Care/Policy Critical Care SOC;Skin [facility]OC;Falls reduction   Outcome Evaluation (rationale for progressing/not progessing Increase mobility and IS use   Patient Specific Goal, Promotion of Health & Safety Pain control with PCA     Nursing Shift Summary    No data found.    Critical Value Notification for the past 12 hrs:   Lab Test Name Test Result Provider Name Comments   07/09/16 0825 aPTT 98.6 [facility] Hold Heparin for 30 min. Restart at 1300units at 0905   07/09/16 1600 aPTT 37.8 [facility] Pharmacist Redraw ABG   07/09/16 1730 aPTT 29 Just[facility] Pharmacist -         Rounding for the past 12 hrs:   Physician Rounding   07/09/16 0820 My nursing colleague or I joined the physician during  his/her interaction/rounds with the patient at least once during my shift       Shift Comments for the past 12 hrs:   Comments   07/09/16 0820 Pt on SBT very agitated, MD aware.  ABG drawn and then bolus propofol for agitation   07/09/16 1641 SICU team at bedside for extubation.  Pt severely agitated/

## 2016-07-09 NOTE — Progress Notes (Signed)
Progress Note  Surgical Intensive Care Unit    Patient Name: Jerome Hunt  MRN: [id number]     Admitting Service: [place] Attending Provider: Rae Halsted    Mechanism of Injury: <principal problem not specified>    Date: 07/09/16  Hospital Day:   3 days - Admitted on: 07/06/2016    Injuries/Problems: Problem List:  2017-12: Peripheral vascular disease (CMS-HCC)  2017-11: Peripheral artery disease (CMS-HCC)        SUBJECTIVE:     Overnight events: OR last night, bounding palp pulses post op; this AM only doppler and then gone from bypass; doppler AT present and ABI was 0.63. Pulse ox placed on foot and was 100% as well.     OBJECTIVE:   Physical Exam:  Intubated, sedated  intermiten tachy  No resp distress  abd soft ND  L groin pico in place; incision on LLE in tact; foot warm; cannot palpate pulse over graft; doppler AT  R foot with doppler DP        Vitals signs:     Latest Entry  Range (last 24 hours)    Temperature: 97.2 F (36.2 C)  Temp  Avg: 98.8 F (37.1 C)  Min: 97.2 F (36.2 C)  Max: 99.8 F (37.7 C)    Blood pressure (BP): 130/81  BP  Min: 114/73  Max: 130/81    Heart Rate: 113  Pulse  Avg: 103.4  Min: 83  Max: 127    Respirations: 21  Resp  Avg: 14.8  Min: 9  Max: 25    SpO2: 98 %  SpO2  Avg: 99.1 %  Min: 90 %  Max: 100 %       No Data Recorded       No Data Recorded       No Data Recorded     Weight: 101.9 kg (224 lb 10.4 oz)  Percentage Weight Change (%): 2.41 %    I&O:  Intake/Output       07/08/16 0600 - 07/09/16 0559 07/09/16 0600 - 07/10/16 0559      2426-8341 1800-0559 Total 0600-1759 1800-0559 Total       Intake    I.V.  2040.7  3178.5 5219.2  222.4  -- 222.4    Drain (Flush)  --  30 30  --  -- --    Total Intake 2040.7 3208.5 5249.2 222.4 -- 222.4       Output    Urine  505  455 960  150  -- 150    Emesis/NG Output  175  225 400  --  -- --    Drains  35  20 55  --  -- --    Total Output [id number] 150 -- 150       Net I/O     1325.7 2508.5 3834.2 72.4 -- 72.4            Labs:  CBC   Recent Labs      07/06/16   1930   07/08/16   1346  07/09/16   0344   WBCCOUNT  17.8*   < >  12.1*  11.4*   HGB  12.3*   < >  8.5*  8.0*   PLCTEL  262   < >  167  158   BANDP  4.8   --    --    --    NEUTP   --    < >  63.3  57.0   LYMPP2  8.6   --    --    --    MONP2  1.9   --    --    --     < > = values in this interval not displayed.          Chemistry  Recent Labs      07/08/16   0519  07/09/16   0344   SODIUM  141  140   K  3.9  3.7   CL  110*  108*   CO2  26  27   BUN  9  10   CREAT  0.7  0.8   GLU  132*  122*   [place]  7.8*  7.8*   MG  2.1  2.1   PHOS  2.1*  3.3     Recent Labs      07/06/16   1930   ALK  57   AST  16   ALT  13   TBILI  0.3   ALB  4.4       Coags  Recent Labs      07/06/16   1930   07/07/16   0247   07/08/16   1630  07/09/16   0108   PTT  >200.0*   < >  24.9   < >  159.5*  83.1*   INR  1.29*   --   1.08   --    --    --     < > = values in this interval not displayed.       Medications:  Scheduled Meds   aspirin  81 mg Daily    docusate  250 mg Daily    famotidine  20 mg Q12H    gabapentin  300 mg TID    influenza vaccine >=3 yrs  0.5 mL Prior to discharge    multivitamin adult  1 tablet Daily    povidone-iodine  1 Application BID    potassium chloride-thiamine-multivitamin-folic acid infusion builder   Q24H     PRN Meds   fentaNYL  25 mcg Q5 Min PRN    fentaNYL  25 mcg Q2H PRN    LORazepam  2 mg Q1H PRN    Or    LORazepam  4 mg Q1H PRN    ondansetron  4 mg Q6H PRN    propofol  30 mg Q3 Min PRN    propofol  30 mg Q2H PRN     IV Meds   dextrose-sodium chloride 5%-0.9% 150 mL/hr at 07/09/16 0600    fentaNYL 250 mcg/hr (07/09/16 0600)    heparin (porcine) 1,500 Units/hr (07/09/16 0600)    plasmalyte-A      propofol 70 mcg/kg/min (07/09/16 5053)       Allergies:  Allergies   Allergen Reactions    No Known Allergies [Other] Other     Received name: No Known Allergies     OVERNIGHT: inc TV from 500 to 550 due to elevated pCO2 50    ASSESSMENT / PLAN / RECOMMENDATIONS:   10M with a traumatic left lower extremity injury 3 years prior to presents with pain to left lower extremity concerning for claudication  S/p left popliteal to distal PT bypass  S/p Bypass revision, left popliteal to peroneal and peroneal to PT jump graft    07/06/16  LEFT POPLITEAL TO DISTAL POSTERIOR TIBIAL ARTERY BYPASS LEFT SAPHENOUS VEIN HARVEST AORTOGRAM LEFT LEG ANGIOGRAM LEFT POPLITEAL ARTERY ANGIOPLASTY (4X4, 5X4) LEFT  DISTAL POSTERIOR TIBIAL ARTERY ANGIOPLASTY       07/07/16  Redo Left transinterosseous below-knee popliteal-peroneal and sequential peroneal-distal posterior tibal-medial tarsal reverse saphenous vein bypass; Left distal segmental fibula resection to expose distal peroneal artery,  Thrombectomy of left below-knee popliteal-posterior tibial reverse saphenous vein bypass    Neuro:  GCS:  GCS 9/11T when off sedation  Mental status: agitation off sedation  - on fent @ 25, prop @ 30; change to precedex  - wean off sedation as pt tolerates  - CIWA for h/o etoh Abuse and withdrawal (69m 12/3)  -aspirin  -gabapentin    CV:BP  Min: 114/73  Max: 130/81  Pulse  Avg: 103.4  Min: 83  Max: 127  120s-130s/70s-80s  90s-120s, now 89    #LLE claudication s/p bypass   - bypass likely thrombosed again  - arterial duplex 07/08/16: There is distal arterial flow noted in the posterior tibial artery. The bypass graft is not visualized.Mild stenosis at the left mid-distal SFA.  - ABI with AT on LLE is 0.63  - heparin gtt 1500/hr  - q1 neurovscular checks  - pulse ox monitor on LLE (currently at 100%)   -  D/c ck f/u    Pulmonary:  - unable to extubate due to severe agitation when coming off sedation  - on AC VC 40% 550/14/5 O2 sat 98%   ABG 40% 7.35/50/157/27/99  - CXR 12/4 increased congestion  - SBT daily and attempt to extubate  - f/u ABG  -attempt to extubate                FEN/GI:  Abd exam: soft  D5NS _0 /hr  Diet:NPO Reason: Surgery/Procedure; Tube Feed: Hold while NPO (if ordered)   Colace  pepcid     Renal:   Intake/Output Summary (Last 24 hours) at 07/09/16 0640  Last data filed at 07/09/16 0600   Gross per 24 hour   Intake          5146.41 ml   Output             1465 ml   Net          3681.41 ml   UOP: adequate at 1L -> 45cc/hr  NG: 400  Drain 55  Cr 0.8    Hematology:   hb 10.6 -> 8.9 ->8.0  -- hep gtt  - f/u CBC 2pm    ID: Temp  Avg: 98.8 F (37.1 C)  Min: 97.2 F (36.2 C)  Max: 99.8 F (37.7 C)  Afebrile WBC 11.4 (12.1)  Micro: BCx 12/1 NGTD  Abx: ancef peri-operative    Endocrine:  FSBS: controlled 120-140    MSK:  PT/OT:  No  Weight Bearing Status:  weight bear as tolerated  -ABI 0.6      ICU Care:    Feedings: No  Analgesia: Yes  Sedation: Yes  Thromboprophylaxis:SCDs, early mobility protocol and subcutaneous heparin  Head-of-bed elevation: Yes  Ulcer prophylaxis: yes  Glycemic control: no  GI Bowel care: Yes  Indwelling catheter removal: yes  ID: de-escalation of Abx: n/a  Spontaneous breathing trial: N/A    Family discussion: yes    Recommendations:  - cont hep gtt  - q1 neurovascular checks and pulse ox on the LLE  - repeat CBC 2pm  - d/c f/u CK q8  - change to precedex and extubate once less agitated off     DISPO: SICU      SICU Attending Attestation:    Remained  in SICU overnight secondary to above diagnoses and due to concern for possible sudden deterioration. Critical care time =  31 minutes today, excluding teaching and procedures.  Patient met criteria for critical illness and ICU level of care during my evaluation and treatment due to the following diagnoses and/or management issues:    1. AMS with agitation - CIWA protocol, changing to dexmedetomidine  2. Acute hypoxic/hypercarbic respiratory failure - weaning vent, though may not be extubatable yet due to altered mental status  3. Acute blood loss anemia - continue to closely monitor hemoglobin level. Transfusion is not required at present. Heparin gtt continues per primary team.  4.       Charyl Bigger. Jaydeen Darley MD  [employer] Clinical Professor   Department of Surgery

## 2016-07-09 NOTE — Progress Notes (Signed)
Inpatient Heparin Drug Monitoring Note    Jerome Hunt is a 40 year old male on anticoagulation therapy for left popliteal-distal posterior tibial bypass with LLE GSV harvest 07/06/16 . Patient received heparin 29,000 units in incremental boluses intra-op during surgery yesterday with PTT>200 at 21:90 on 12/1. Patient returned to OR emergently last night and heparin drip was held on 12/2.     PMH:  Traumatic left lower extremity 2014  Claudication    aPTT Goal:   65 - 90 seconds    Dosing weight = 99.5kg     Current Heparin Rate:     Dose (Units/hr) Heparin: 1500 Units/hr      Labs:    PTT (last 24 hours):  -43.9 @0830  on 12/2  - 83.1 @ 0108 on 12/4  - 98.6 @0636  on 12/4    Labs:  (Last 3 days):    Recent Labs      07/07/16   2204  07/08/16   0519  07/08/16   1346  07/09/16   0344   HGB  10.6*  8.9*  8.5*  8.0*   HCT  30.7*  25.6*  25.4*  23.5*   CREAT  0.8  0.7   --   0.8       Labs (Last values):  Albumin   Date Value Ref Range Status   07/06/2016 4.4 4.2 - 5.5 G/DL Final     AST   Date Value Ref Range Status   07/06/2016 16 13 - 39 U/L Final     ALT   Date Value Ref Range Status   07/06/2016 13 7 - 52 U/L Final     Bilirubin, Total   Date Value Ref Range Status   07/06/2016 0.3 0.0 - 1.4 mg/dL Final       Drug/Disease Interactions and Assessments:    - Other Anticoagulants:   Current Facility-Administered Medications   Medication Dose Frequency    heparin (porcine)  1,300 Units/hr Continuous       - Bleeding Assessment:  H/H is stable. Patient does not exhibit any signs or symptoms of bleeding (spoke to RN).     - Current Nutrition Assessment:   Recent changes in nutrition/diet? no  NPO Reason: Surgery/Procedure; Tube Feed: Hold while NPO (if ordered)    Assessment/Plan:  - PTT=98.6 seconds is above therapeutic goal range.   - Will HOLD heparin x30 minutes and decrease maintenance infusion to 1300 units/hour   - Check aPTT every 6 hours, to be drawn at 15:00   - Continue to monitor for signs and symptoms of bleeding    Pharmacy will continue to monitor closely. For questions please call [phone]    Frutoso SchatzKathleen Thai Ansel BongBuu Aarushi Hemric, [facility]

## 2016-07-09 NOTE — Progress Notes (Addendum)
Progress Note    Patient Name: Jerome Hunt  MRN: [id number]  Room#: 6232/6232-01    Service: [place] Attending Provider: Wyn Forster, MD    Mechanism of Injury: LLE arterial insufficiency    Date: 07/09/16  Hospital Day:   3 days - Admitted on: 07/06/2016  Post Operative Day(s): 1 Day Post-Op    left lower extremity angioplasty, left popliteal-distal posterior tibial bypass with LLE GSV harvest 07/06/16  Revision left transinterossesous below-knee popliteal-peroneal and sequential peroneal-distal posterior tibial-medial tarsal reverse saphenous vein bypass  Left distal segmental fibula resection to expose distal peroneal artery, thrombectomy of left below-knee popliteal-posterior tibial reverse saphenous vein bypass 07/07/16    SUBJECTIVE: Jerome Hunt is a 40 year old ex smoker with a history of traumatic left lower extremity injury 3 years ago underwent left lower extremity angioplasty, left popliteal-distal posterior tibial bypass with LLE GSV harvest 07/06/16 by Dr Rae Halsted    Overnight events: Patient stable overnight, continues to be intubated secondary to inability to follow commands and increased secretions. Maintaining adequate urine output. Drain output 46m/24hr.    OBJECTIVE:     GENERAL: intubated, unable to follow commands  HEENT: NC/AT EOMI MMM ET tube and NGT in place  NECK: supple  ABDOMEN: soft, non-distended  GROIN: no evidence of hematoma at right femoral access site, pico dressing in place left groin  EXTREMITY: LLE with dressing c/d/i,  dopplerable pulses  LLE drain in place, serosanguinous output  LLE warm to touch, palpable pulses RLE  LUE a line in place  Foley catheter in place, clear urine output      Lines, Drains and Airways:  Peripheral IV - 20 G Left Arm (Active)   Site Assessment Phlebitis 0 07/07/2016  4:00 AM   Line Lumen Status Infusing 07/07/2016  4:00 AM   Dressing Transparent 07/07/2016  4:00 AM   Dressing Status Clean, dry, intact 07/07/2016  4:00 AM        Peripheral IV - 18 G (Active)   Site Assessment Phlebitis 0 07/07/2016  4:00 AM   Line Lumen Status Infusing 07/07/2016  4:00 AM   Dressing Transparent 07/07/2016  4:00 AM   Dressing Status Clean, dry, intact 07/07/2016  4:00 AM       Arterial Line (ART) -  Left Radial (Active)   Site Assessment Phlebitis 0 07/07/2016  4:00 AM   Line Lumen Status Patent 07/07/2016  4:00 AM   Treatment Arm board on 07/07/2016  4:00 AM   Dressing Transparent 07/07/2016  4:00 AM   Dressing Status Clean, dry, intact 07/07/2016  4:00 AM       Vitals signs:     Latest Entry  Range (last 24 hours)    Temperature: 100.2 F (37.9 C)  Temp  Avg: 99 F (37.2 C)  Min: 97.2 F (36.2 C)  Max: 100.2 F (37.9 C)    Blood pressure (BP): 136/70  BP  Min: 114/73  Max: 136/70    Heart Rate: 104  Pulse  Avg: 104.9  Min: 87  Max: 127    Respirations: 10  Resp  Avg: 14.4  Min: 9  Max: 25    SpO2: 99 %  SpO2  Avg: 99.1 %  Min: 90 %  Max: 100 %       No Data Recorded       No Data Recorded       No Data Recorded     Weight: 101.9 kg (224 lb 10.4 oz)  Percentage Weight Change (%):  2.41 %    Labs:  CBC  Recent Labs      07/08/16   1346  07/09/16   0344   HGB  8.5*  8.0*   HCT  25.4*  23.5*        Chemistry  Recent Labs      07/08/16   0519  07/09/16   0344  07/09/16   0345  07/09/16   0826   K  3.9  3.7   --    --    CL  110*  108*   --    --    BICARB   --    --   26.9  27.2*   BUN  9  10   --    --    CREAT  0.7  0.8   --    --    GLU  132*  122*   --    --    [place]  7.8*  7.8*   --    --    MG  2.1  2.1   --    --    PHOS  2.1*  3.3   --    --      Recent Labs      07/06/16   1930   ALK  57   AST  16   ALT  13   TBILI  0.3   ALB  4.4       Coags  Recent Labs      07/06/16   1930   07/07/16   0247   07/09/16   0108  07/09/16   0635   PTT  >200.0*   < >  24.9   < >  83.1*  98.6*   INR  1.29*   --   1.08   --    --    --     < > = values in this interval not displayed.       Medications:  Scheduled Meds   aspirin  81 mg Daily    docusate  250 mg Daily     famotidine  20 mg Q12H    gabapentin  300 mg TID    influenza vaccine >=3 yrs  0.5 mL Prior to discharge    multivitamin adult  1 tablet Daily    povidone-iodine  1 Application BID    potassium chloride-thiamine-multivitamin-folic acid infusion builder   Q24H     PRN Meds   fentaNYL  25 mcg Q5 Min PRN    fentaNYL  25 mcg Q2H PRN    LORazepam  2 mg Q1H PRN    Or    LORazepam  4 mg Q1H PRN    ondansetron  4 mg Q6H PRN    propofol  30 mg Q3 Min PRN    propofol  30 mg Q2H PRN     IV Meds   dextrose-sodium chloride 5%-0.9% 150 mL/hr at 07/09/16 0900    fentaNYL 250 mcg/hr (07/09/16 0600)    heparin (porcine)      propofol 70 mcg/kg/min (07/09/16 0933)       Allergies:  Allergies   Allergen Reactions    No Known Allergies [Other] Other     Received name: No Known Allergies       ASSESSMENT / PLAN / RECOMMENDATIONS:  Neuro:  Intubated and sedated   Fentanyl/propofol gtt, trying to wean off sedation and extubate   CIWA protocol  CV:BP  Min: 114/73  Max: 136/70  Pulse  Avg: 104.9  Min: 87  Max: 127  Continue to monitor  q1h vascular checks     Pulmonary:Resp  Avg: 14.4  Min: 9  Max: 25  SpO2  Avg: 99.1 %  Min: 90 %  Max: 100 %    Intubated  ABG                FEN/GI:  NPO while intubated  NGT in place  GI PPX    Renal:   Intake/Output       07/08/16 0600 - 07/09/16 0559 07/09/16 0600 - 07/10/16 0559      0600-1759 1800-0559 Total 0600-1759 1800-0559 Total       Intake    I.V.  2040.7  3178.5 5219.2  647  -- 647    Drain (Flush)  --  30 30  --  -- --    Total Intake 2040.7 3208.5 5249.2 647 -- 647       Output    Urine  505  455 960  275  -- 275    Emesis/NG Output  175  225 400  --  -- --    Drains  35  20 55  50  -- 50    Total Output [phone] 325 -- 325       Net I/O     1325.7 2508.5 3834.2 322 -- 322        Trend CK    ID: Temp  Avg: 99 F (37.2 C)  Min: 97.2 F (36.2 C)  Max: 100.2 F (37.9 C)  Received peri-op abx  WBC 11.4  Blood cx negative      Hematology:  H/H stable  Heparin gtt     Endocrine:  Monitor blood sugars    Prophylaxis:  SCD RLE   Heparin gtt  GI ppx  Bowel regimen    Dispo:  SICU      Pt sedated  Will plan to extubate if pt less anxious  Left leg/foot warm  Doppler AT signal audible  Arterial duplex pending  Diuresis  Continue with heparin prophylaxis

## 2016-07-09 NOTE — Op Note (Signed)
DATE OF OPERATION:  07/07/2016    SURGEON(S):  Purcell Mouton, MD; Hurshel Keys, MD    PREOPERATIVE DIAGNOSES:    1. Peripheral arterial disease, tibioperoneal occlusive disease.  2. Postoperative thrombosis of left below-knee popliteal-posterior   tibial reverse saphenous vein bypass.    POSTOPERATIVE DIAGNOSES:    1. Peripheral arterial disease, tibioperoneal occlusive disease.  2. Postoperative thrombosis of left below-knee popliteal-posterior   tibial reverse saphenous vein bypass.    PROCEDURE PERFORMED:    1. Aortoiliac and selective left lower extremity angiography via right   common femoral artery access.  2. Ultrasound-guided right common femoral artery needle entry and   third order catheter placement x2.  3. Thrombectomy of left below-knee popliteal-posterior tibial reverse   saphenous vein bypass.  4. Redo left trans-interosseous below-knee popliteal-peroneal and   sequential peroneal-distal posterior tibial-medial tarsal reverse   saphenous vein bypass.  5. Distal segmental fibulectomy (approximately 4 cm) to expose distal   peroneal artery.  6. Distal posterior tibial/branch plantar artery endarterectomy.    RESIDENT:  Isac Caddy, MD    ANESTHESIA:  General endotracheal anesthesia.    TOTAL FLUOROSCOPY TIME:  11.6 minutes.    DAP:  50598 mGy/cm sq.    AK:  174 mGy.    CONTRAST:  85 cc Visipaque (320 mg I/mL).    INDICATIONS FOR PROCEDURE:  The patient is a 40 year old black male   who had been evaluated by my associate, Dr. Dalene Carrow for   advanced peripheral arterial disease.  He sustained traumatic injury   to his left ankle resulting in advanced arterial ischemia to the left   leg.  He is found to have significant tibioperoneal occlusive disease   leading to symptoms consistent with ischemic rest pain of his foot.    The patient had been previously evaluated at [facility] and   percutaneous intervention had proven to be difficult at best with no    discernible improvement.  More recently, he had been evaluated by Dr.   Duanne Guess and underwent revascularization of his left lower extremity in   the form of a left below-knee popliteal-posterior tibial reverse   saphenous vein bypass.  This had been performed yesterday, July 06, 2016.  Unfortunately, the graft is now noted to be thrombosed with   ischemic changes to his left distal lower extremity.  The patient   therefore is brought to the hybrid operating room emergently for   revascularization of the left limb.  Diagnostic selective left lower   extremity angiography coupled with open thrombectomy and revision of   the bypass graft had been contemplated and informed consent obtained   through the patient's spouse.     The patient has remained intubated postoperatively and had not been in   a position to provide informed consent.  His wife, however, has   provided consent to proceed as recommended.    PROCEDURE IN DETAIL:  The patient was transferred directly from the   surgical intensive care unit to the hybrid operating suite and placed   supine onto the radiolucent cath lab table.  The patient had been   intubated already and general endotracheal anesthesia was continued to   be administered.  The patient's lower abdomen, bilateral groins, and   lower extremities were bilaterally prepped with ChloraPrep and draped   in the usual sterile fashion.  A bladder Foley drainage catheter had   been previously placed and positioned to gravity.  A "time-out brief"  was undertaken per established protocol.  Antibiotics had been   administered per SCIP guidelines.     Attention was first turned towards the diagnostic selective left lower   extremity angiography.  Using a 7.5 MHz linear array ultrasound probe,   the right common femoral artery was identified below the level of the   inguinal ligament, and above its bifurcation, the profunda femoral and    superficial femoral arteries.  A 21-gauge micropuncture needle was   used to gain entry into the artery.  A 0.018 inch mandrel guidewire   was then advanced and the micropuncture sheath placed.  This was then   exchanged for 5-French 10 cm side-port vascular sheath over a 0.035   inch entry guidewire.  A 5-French diagnostic Omni Flush catheter was   then advanced and positioned at the aortic bifurcation.  The left   iliofemoral artery was selectively cannulated using a 0.035 inch   hydrophilic Glidewire, advancing into the left superficial femoral   artery.  The Omni Flush catheter was removed and exchanged for a 65 cm   5-French angled glide catheter.  The angled glide catheter was   positioned within the left superficial femoral artery.  Diagnostic   selective left lower extremity angiography was performed in serial   fashion.  This included the thigh, popliteal and infragenicular   tibioperoneal vessels.  Lateral views of the foot were also obtained.    This verified the thrombosis of the below-knee popliteal to distal   posterior tibial reverse saphenous vein bypass.  In addition, the   infrageniculate popliteal artery was now thrombosed where it had been   previously patent in the preoperative angiography prior to the initial   operation yesterday.     The diagnostic catheter was removed and the 5-French sheath was left   in place in the right common femoral artery.  The patient had already   been receiving systemic heparin anticoagulation prior to his operation   today.  This was continued at a rate of 1400 units/hour.  Activated   clotting time (ACT) was subsequently drawn and maintained at a level   greater than 250 seconds during duration of the operation.  Additional   incremental doses of unfractionated heparin was administered to   maintain this level of anticoagulation.     The infragenicular medial surgical incision was reopened exposing the    proximal aspect of the below-knee popliteal to posterior tibial   artery.  The distal incision overlying the distal posterior tibial   artery was also exposed.  This allowed me to evaluate both the   proximal and distal anastomoses of the bypass conduit.     The native below-knee popliteal artery was controlled proximally and   distally with silastic Elastomer placed proximally and distally.  A   transverse venotomy was made across the bypass graft at its   anastomosis.  I was then able to pass a #3 Fogarty catheter proximally   to thrombectomize the below-knee popliteal artery.  This was   successful in extracting fresh thrombus with reestablishment of   pulsatile arterial inflow.  The inflow was then flushed with dilute   heparinized saline solution and clamped with a Satinsky clamp.  In   similar fashion, the tibioperoneal and peroneal arteries were   thrombectomized using a #2 Fogarty catheter with extraction of fresh   thrombus.  There was moderate amount of backbleeding through these   arteries.  These arteries were also flushed with dilute  heparinized   saline solution and clamped.     The distal end of the reverse saphenous vein bypass at the posterior   tibial anastomosis was then divided while maintaining the anastomotic   hood onto the vessel.  The vein graft was pulled out through the   previously made subcutaneous tunnel.  The entire graft was then   thrombectomized by squeezing the thrombus out through the distal cut   end of the vein.  I released the proximal clamp and this allowed for   additional flushing of the conduit.  There was an excellent pulse   through the end of the venous conduit.  The end of this conduit was   then ligated with 2-0 silk suture.     Since I anticipated revising this graft to the distal peroneal artery   as an alternative runoff instead of the posterior tibial artery, the   lateral incision was made over the distal fibula.  This was carried    down through the subcutaneous tissue.  The fibula was identified and   periosteal elevator was used to isolate the fibula circumferentially.    An approximately 4 cm segment of the fibula was resected using the   power oscillating saw.  The peroneal artery behind the fibula was   protected with a malleable retractor.  The segmental fibula was   resected and sent off as a surgical specimen.  The peroneal artery was   then dissected along the length gaining exposure to it.  The artery   was quite small measuring, at best, 2.5 mm in diameter.     A second incision was made in the more proximal lateral below-knee leg   over the anterior compartment.  This was carried down through   subcutaneous tissue.  A trans-interosseous dissection was carried out   medially to laterally and a cruciate incision made within the   trans-interosseous membrane so that there was a very patulous   connection between the medial aspect of the below-knee popliteal fossa   and the anterior compartment.  The reverse saphenous vein, which had   been anastomosed to the below-knee popliteal segment was then passed   through this trans-interosseous opening.  The graft was then tunneled   subcutaneously using a Scanlan sheathed tunneler from the proximal   lateral leg incision to the incision where the fibulectomy had been   made.  The vein graft was passed being careful not to kink or twist   the conduit.  The distal end of the vein was juxtaposed to the   peroneal artery.     Proximal and distal control of the peroneal artery was obtained with   the Yasargil clips.  A longitudinal arteriotomy was made in the   peroneal artery.  This was extended with the Diethrich-Potts scissors.    The distal end of the reverse saphenous vein was then spatulated to   accommodate an end-to-side anastomosis.  This was sewn in place   utilizing 7-0 Prolene suture in a continuous running fashion.  Prior    to completion of the anastomosis, I was able to pass a 2 mm vascular   probe proximal and distally within the peroneal artery.  The   anastomosis was then completed and all clamps removed.     Continuous-wave Doppler insonation demonstrated excellent biphasic   signals in the outflow peroneal artery in both the efferent and   afferent limbs.  There was also palpable pulse within the bypass  conduit.     Since I judged that there was significant resistance to the very small   outflow distal tibioperoneal arteries, I planned to place a sequential   perineal to distal posterior tibial-medial tarsal reverse saphenous   vein in addition to the peroneal artery runoff.  An additional segment   of the great saphenous vein therefore was harvested distally in the   left lower extremity for an approximately 15 cm segment.  Side   branches of the veins were ligated with 3-0 silk suture and divided.    Once the segment of reverse saphenous vein was harvested, gentle   hydrostatic pressure was used to dilate the saphenous vein in a   reverse configuration.     The proximal end of the newly harvested great saphenous vein was then   sewn in end-to-side configuration to the distal peroneal artery   anastomoses segment of the saphenous vein.  Proximal control of the   saphenous vein was obtained in the below-knee segment.  A venotomy was   made in the distal end of the saphenous vein.  The proximal end of the   new saphenous segment was then sewn in end-to-side configuration   utilizing 6-0 Prolene suture in continuous running fashion.  Once the   anastomosis had been completed, all clamps were removed and blood flow   was established again through the peroneal artery as well as the newly   constructed sequential reverse saphenous graft.  This graft was then   tunneled through subcutaneous space from the distal peroneal artery   across the ankle to the medial distal posterior tibial artery.  The    distal end of the saphenous vein could then be juxtaposed to the   distal peroneal artery.     Attention was then turned towards the distal posterior tibial artery   segment.  The previous anastomosis had been made just proximal to its   bifurcation to the medial and lateral plantar branches.  The   arteriotomy was extended into the medial plantar branch.  There was a   moderate amount of atherosclerotic disease at this location, and   therefore localized endarterectomy was performed.  By doing so, there   was a much more patulous lumen within the posterior tibial artery   runoff.  I was able to pass a #2 Fogarty catheter into both the medial   and lateral plantar vessels extracting a small amount of thrombus.    These arteries were then flushed with dilute heparinized saline   solution and clamped with Yasargil clips.  The distal end of the   sequential over saphenous vein was then sewn in end-to-side   configuration with 7-0 Prolene suture in a continuous running fashion   incorporating the prior hood of the bypass and extending the new graft   into the tarsal branch.  Prior to completion of the anastomosis, I was   able to pass a #2 mm probe into the medial and lateral plantar   branches.  The anastomosis was completed and all clamps removed.    There was an excellent pulse overlying the sequential bypass graft to   the posterior tibial artery.  Continuous Doppler insonation   demonstrated biphasic signals with no discernible staccato pattern.     With all the anastomosis being hemostatic, I then completed a   completion selective left lower extremity angiography.  I once again   selectively cannulated the left lower extremity extending a 100 cm  5-French angled glide catheter to the distal superficial femoral   artery.  Selective left lower extremity angiography was performed in   serial fashion.  This included imaging of the left trans-interosseous    below-knee popliteal to peroneal and sequential peroneal to distal   posterior tibial-medial tarsal reverse saphenous vein bypass conduits.    Once these imaging had been completed, the diagnostic catheter was   removed and the 5-French sheath was also removed with manual   compression placed over the common femoral artery.     The great saphenous vein harvest incision was closed with deep layer   3-0 Vicryl suture in a continuous running fashion.  The   infrageniculate popliteal incision was closed after placing a 10 mm   flat channel drain within the popliteal fossa exiting through the   anterior portion of the incision.  The drain was sutured in place with   3-0 Ethilon suture.  The popliteal fossa was closed with interrupted   2-0 Vicryl suture.  The subcutaneous tissue was reapproximated with   3-0 Vicryl suture.  The subdermal layer was closed in entirety with   4-0 Monocryl.  Skin was then finally closed with 5-0 Monocryl in   running subcuticular fashion.  Dermabond was used to seal the skin   edge completely.     The counter incision made in the proximal lateral leg where the   trans-interosseous membrane had been opened to traverse the left   trans-interosseous below-knee popliteal to peroneal reverse saphenous   vein bypass incision was closed with deep layer of 3-0 Vicryl suture.    Skin was approximated with interrupted 3-0 Ethilon suture in vertical   mattress fashion.  The distal peroneal artery incision was closed with   deep layer of 3-0 Vicryl suture.  Skin was also approximated with 3-0   Ethilon in vertical mattress fashion.  The posterior tibial incision   was closed with deep layer 4-0 Vicryl suture and skin approximated   with 3-0 Ethilon in a vertical mattress fashion.  Sterile dressings   were placed over all of the incisions.  A negative pressure PICO   dressing was placed on proximal thigh incision as had been done in the   initial operation.      The patient tolerated the procedure very well with no periprocedural   complications.  Total estimated blood loss was 300 cc.  Total   intravenous fluid administration perioperatively was 1200 cc of   crystalloid solution.  The patient remained intubated and was   subsequently transferred to the surgical intensive care unit in   serious condition.  At the conclusion of the operation, the patient   had an excellent palpable pulse along the length of the left   trans-interosseous below-knee popliteal to peroneal artery as well as   over the sequential peroneal to distal posterior tibial-medial tarsal   reverse saphenous vein bypass.    ANGIOGRAPHIC INTERPRETATION:  Initial diagnostic selective left lower   extremity angiography showed patency of the left common femoral,   profunda femoral, and superficial femoral arteries.  The below-knee   popliteal artery, however, had an abrupt cut-off consistent with   thrombosis of the artery.  There were minute collateralization from   the popliteal artery re-establishing some flow through the distal   peroneal artery and distal posterior tibial branches.  Lateral views   of the foot showed minimal contrast within it, even with delayed   imaging.     Following  thrombectomy and revision of the lower extremity bypass   grafts, the completion selective angiography showed patency of the   below-knee popliteal artery and native tibioperoneal branch.  The left   trans-interosseous below-knee popliteal-peroneal bypass graft was   patent leading to the distal peroneal artery.  There was antegrade and   retrograde flow through the peroneal artery.  There was significant   amount of filling through collateral vessels at the level of the ankle   filling the tarsal and posterior tibial branches.  The sequential   peroneal-distal tibioperoneal-medial tarsal reverse saphenous vein   bypass had attenuated flow requiring a great amount of time to fill.     This was consistent with the high-grade resistance noted within this   very minute, attenuated outflow vessel.    DICTATED BY:  Purcell Mouton, MD    RMF/MODL  DD:  07/08/2016 22:45:53  DT:  07/09/2016 20:00:48  Job #:  [id number]

## 2016-07-10 ENCOUNTER — Inpatient Hospital Stay: Payer: No Typology Code available for payment source

## 2016-07-10 DIAGNOSIS — J811 Chronic pulmonary edema: Secondary | ICD-10-CM

## 2016-07-10 DIAGNOSIS — G8918 Other acute postprocedural pain: Secondary | ICD-10-CM

## 2016-07-10 DIAGNOSIS — R9431 Abnormal electrocardiogram [ECG] [EKG]: Secondary | ICD-10-CM

## 2016-07-10 DIAGNOSIS — I998 Other disorder of circulatory system: Secondary | ICD-10-CM

## 2016-07-10 LAB — CBC WITH DIFF, BLOOD
ANC automated: 11.3 10*3/uL — ABNORMAL HIGH (ref 2.0–8.1)
Basophils %: 0.5 %
Basophils %: 1.9 %
Basophils %: 0 %
Basophils %: 0 %
Basophils Absolute: 0.1 10*3/uL (ref 0.0–0.2)
Basophils Absolute: 0.3 10*3/uL — ABNORMAL HIGH (ref 0.0–0.2)
Basophils Absolute: 0 10*3/uL (ref 0.0–0.2)
Basophils Absolute: 0 10*3/uL (ref 0.0–0.2)
Eosinophils %: 1.7 %
Eosinophils %: 0 %
Eosinophils %: 1 %
Eosinophils %: 0 %
Eosinophils Absolute: 0.3 10*3/uL (ref 0.0–0.5)
Eosinophils Absolute: 0 10*3/uL (ref 0.0–0.5)
Eosinophils Absolute: 0.2 10*3/uL (ref 0.0–0.5)
Eosinophils Absolute: 0 10*3/uL (ref 0.0–0.5)
Hematocrit: 20.8 % — CL (ref 39.5–50.0)
Hematocrit: 24 % — ABNORMAL LOW (ref 39.5–50.0)
Hematocrit: 27.7 % — ABNORMAL LOW (ref 39.5–50.0)
Hematocrit: 27.4 % — ABNORMAL LOW (ref 39.5–50.0)
Hgb: 7 G/DL — ABNORMAL LOW (ref 13.5–16.9)
Hgb: 8.1 G/DL — ABNORMAL LOW (ref 13.5–16.9)
Hgb: 9.3 G/DL — ABNORMAL LOW (ref 13.5–16.9)
Hgb: 9.3 G/DL — ABNORMAL LOW (ref 13.5–16.9)
Lymphocytes %.: 14.6 %
Lymphocytes %.: 10.5 %
Lymphocytes %.: 16.2 %
Lymphocytes %: 16.7 %
Lymphocytes Absolute: 2.5 10*3/uL (ref 0.9–3.3)
Lymphocytes Absolute: 2.3 10*3/uL (ref 0.9–3.3)
Lymphocytes Absolute: 1.8 10*3/uL (ref 0.9–3.3)
Lymphocytes Absolute: 2.9 10*3/uL (ref 0.9–3.3)
MCH: 32 PG (ref 27.0–33.5)
MCH: 31.4 PG (ref 27.0–33.5)
MCH: 31.6 PG (ref 27.0–33.5)
MCH: 31.4 PG (ref 27.0–33.5)
MCHC: 33.7 G/DL (ref 32.0–35.5)
MCHC: 33.7 G/DL (ref 32.0–35.5)
MCHC: 33.8 G/DL (ref 32.0–35.5)
MCHC: 33.8 G/DL (ref 32.0–35.5)
MCV: 95 FL (ref 81.5–97.0)
MCV: 93.3 FL (ref 81.5–97.0)
MCV: 93.5 FL (ref 81.5–97.0)
MCV: 93 FL (ref 81.5–97.0)
MPV: 10.5 FL (ref 7.2–11.7)
MPV: 10.3 FL (ref 7.2–11.7)
MPV: 11 FL (ref 7.2–11.7)
MPV: 9.7 FL (ref 7.2–11.7)
Monocytes %: 6.6 %
Monocytes %: 4.9 %
Monocytes %: 7.6 %
Monocytes %: 6.7 %
Monocytes Absolute: 1 10*3/uL — ABNORMAL HIGH (ref 0.0–0.8)
Monocytes Absolute: 0.8 10*3/uL (ref 0.0–0.8)
Monocytes Absolute: 1.3 10*3/uL — ABNORMAL HIGH (ref 0.0–0.8)
Monocytes Absolute: 1.2 10*3/uL — ABNORMAL HIGH (ref 0.0–0.8)
Neutrophils % (A): 74.5 %
PLT Count: 143 10*3/uL — ABNORMAL LOW (ref 150–400)
PLT Count: 150 10*3/uL (ref 150–400)
PLT Count: 153 10*3/uL (ref 150–400)
PLT Count: 169 10*3/uL (ref 150–400)
Platelet Morphology: NORMAL
Platelet Morphology: NORMAL
Platelet Morphology: NORMAL
RBC: 2.19 10*6/uL — ABNORMAL LOW (ref 3.70–5.00)
RBC: 2.57 10*6/uL — ABNORMAL LOW (ref 3.70–5.00)
RBC: 2.96 10*6/uL — ABNORMAL LOW (ref 3.70–5.00)
RBC: 2.94 10*6/uL — ABNORMAL LOW (ref 3.70–5.00)
RDW-CV: 14.1 % (ref 11.6–14.4)
RDW-CV: 14.8 % — ABNORMAL HIGH (ref 11.6–14.4)
RDW-CV: 14.9 % — ABNORMAL HIGH (ref 11.6–14.4)
RDW-CV: 14.9 % — ABNORMAL HIGH (ref 11.6–14.4)
Seg Neutro % (M): 78.6 %
Seg Neutro % (M): 80.9 %
Seg Neutro % (M): 77.1 %
Seg Neutro Abs (M): 12.3 10*3/uL — ABNORMAL HIGH (ref 2.0–7.5)
Seg Neutro Abs (M): 13.8 10*3/uL — ABNORMAL HIGH (ref 2.0–7.5)
Seg Neutro Abs (M): 13.6 10*3/uL — ABNORMAL HIGH (ref 2.0–7.5)
White Bld Cell Count: 15.1 10*3/uL — ABNORMAL HIGH (ref 4.0–10.5)
White Bld Cell Count: 15.6 10*3/uL — ABNORMAL HIGH (ref 4.0–10.5)
White Bld Cell Count: 17.1 10*3/uL — ABNORMAL HIGH (ref 4.0–10.5)
White Bld Cell Count: 17.6 10*3/uL — ABNORMAL HIGH (ref 4.0–10.5)

## 2016-07-10 LAB — BASIC METABOLIC PANEL, BLOOD
BUN: 8 mg/dL (ref 7–25)
CO2: 25 mmol/L (ref 21–31)
Calcium: 7.9 mg/dL — ABNORMAL LOW (ref 8.6–10.3)
Chloride: 109 mmol/L — ABNORMAL HIGH (ref 98–107)
Creat: 0.8 mg/dL (ref 0.7–1.3)
Electrolyte Balance: 6 mmol/L (ref 2–12)
Glucose: 112 mg/dL (ref 70–115)
Potassium: 3.4 mmol/L — ABNORMAL LOW (ref 3.5–5.1)
Sodium: 140 mmol/L (ref 136–145)
eGFR - high estimate: >60 (ref >59)
eGFR - low estimate: >60 (ref >59)

## 2016-07-10 LAB — ARTERIAL BLOOD GAS
Base Excess: 2.1 mmol/L
Base Excess: 2.1 mmol/L
HCO3: 27 mmol/L (ref 21.0–27.0)
HCO3: 26.4 mmol/L (ref 21.0–27.0)
Inspired O2: 0.28
Inspired O2: 0.28
O2 Sat: 97.7 % (ref 95.0–99.0)
O2 Sat: 98.7 % (ref 95.0–99.0)
PCO2: 43.3 MMHG — ABNORMAL HIGH (ref 36.0–42.0)
PO2: 95.4 MMHG (ref 80.0–104.0)
Patient Temp: 37 DEG C
Patient Temp: 37.7 DEG C
pCO2 (Temp Adjusted): 41.3 MMHG (ref 36.0–42.0)
pH (Temp Adjusted): 7.43 — ABNORMAL HIGH (ref 7.38–7.42)
pH: 7.41 (ref 7.38–7.42)
pO2 (Temp Adjusted): 129 MMHG — ABNORMAL HIGH (ref 80.0–104.0)

## 2016-07-10 LAB — URINALYSIS WITH CULTURE REFLEX, WHEN INDICATED
Bilirubin, UA: NEGATIVE
Glucose, UA: NEGATIVE MG/DL
Ketones, UA: 5 MG/DL — AB
Leukocyte Esterase, UA: NEGATIVE
Nitrite, UA: NEGATIVE
Protein, UA: NEGATIVE MG/DL
Specific Grav, UA: 1.004 (ref 1.003–1.030)
UA Cult: NEGATIVE
Urobilinogen, UA: <2 MG/DL (ref <2.0)
pH, UA: 7 (ref 5.0–8.0)

## 2016-07-10 LAB — MRSA SCREEN CULTURE: Culture Result: NEGATIVE

## 2016-07-10 LAB — APTT, BLOOD
PTT: 99.6 s (ref 24.1–36.3)
PTT: 28.1 s (ref 24.1–36.3)
PTT: 42 s — ABNORMAL HIGH (ref 24.1–36.3)

## 2016-07-10 LAB — TROPONIN I, BLOOD
Troponin I: 0.05 ng/mL — ABNORMAL HIGH (ref 0.00–0.03)
Troponin I: 0.03 ng/mL (ref 0.00–0.03)

## 2016-07-10 LAB — PATHOLOGY TISSUE EXAM - ~~LOC~~

## 2016-07-10 LAB — PTT CRT VAL CALL

## 2016-07-10 LAB — HISTORICAL HISTOLOGY DATA

## 2016-07-10 LAB — HEMATOCRIT CRITICAL VALUE CALL

## 2016-07-10 MED ORDER — LORAZEPAM 2 MG/ML IJ SOLN
1.0000 mg | Freq: Once | INTRAMUSCULAR | Status: DC
Start: 2016-07-10 — End: 2016-07-10

## 2016-07-10 MED ORDER — HEPARIN SODIUM (PORCINE) 1000 UNIT/ML IJ SOLN (CUSTOM)
4000.0000 [IU] | Freq: Once | INTRAMUSCULAR | Status: DC
Start: 2016-07-10 — End: 2016-07-10
  Administered 2016-07-10 (×2): 4000 [IU] via INTRAVENOUS
  Filled 2016-07-10: qty 4

## 2016-07-10 MED ORDER — THIAMINE HCL 100 MG/ML IJ SOLN
INTRAVENOUS | Status: DC
Start: 2016-07-10 — End: 2016-07-14
  Administered 2016-07-10 – 2016-07-14 (×5): via INTRAVENOUS
  Filled 2016-07-10 (×9): qty 1000

## 2016-07-10 MED ORDER — CEFAZOLIN SODIUM-DEXTROSE 2-4 GM/100ML-% IV SOLN
2000.0000 mg | Freq: Three times a day (TID) | INTRAVENOUS | Status: DC
Start: 2016-07-10 — End: 2016-07-12
  Administered 2016-07-10 – 2016-07-12 (×7): 2000 mg via INTRAVENOUS
  Filled 2016-07-10 (×7): qty 2

## 2016-07-10 MED ORDER — SODIUM CHLORIDE 0.9 % IV SOLN
20.0000 meq | Freq: Once | INTRAVENOUS | Status: DC
Start: 2016-07-10 — End: 2016-07-10

## 2016-07-10 MED ORDER — LIDOCAINE HCL 2 % EX GEL
Freq: Once | CUTANEOUS | Status: AC
Start: 2016-07-10 — End: 2016-07-11

## 2016-07-10 MED ORDER — FUROSEMIDE 10 MG/ML IJ SOLN
40.00 mg | Freq: Once | INTRAMUSCULAR | Status: AC
Start: 2016-07-10 — End: 2016-07-10
  Administered 2016-07-10 (×2): 40 mg via INTRAVENOUS
  Filled 2016-07-10: qty 4

## 2016-07-10 MED ORDER — SODIUM CHLORIDE 0.9 % IV SOLN
Freq: Once | INTRAVENOUS | Status: AC | PRN
Start: 2016-07-10 — End: 2016-07-10
  Administered 2016-07-10: 20:00:00 via INTRAVENOUS

## 2016-07-10 MED ORDER — CHLORDIAZEPOXIDE HCL 25 MG OR CAPS
75.0000 mg | ORAL_CAPSULE | Freq: Three times a day (TID) | ORAL | Status: DC
Start: 2016-07-10 — End: 2016-07-15
  Administered 2016-07-10 – 2016-07-15 (×15): 75 mg via ORAL
  Filled 2016-07-10 (×14): qty 3

## 2016-07-10 MED ORDER — HYDROMORPHONE HCL 2 MG/ML IJ SOLN
0.5000 mg | INTRAMUSCULAR | Status: DC | PRN
Start: 2016-07-10 — End: 2016-07-12
  Administered 2016-07-10 – 2016-07-12 (×8): 0.5 mg via INTRAVENOUS
  Filled 2016-07-10 (×7): qty 1

## 2016-07-10 MED ORDER — LORAZEPAM 2 MG/ML IJ SOLN
3.0000 mg | INTRAMUSCULAR | Status: DC | PRN
Start: 2016-07-10 — End: 2016-07-18
  Administered 2016-07-10 – 2016-07-12 (×19): 3 mg via INTRAVENOUS
  Filled 2016-07-10 (×18): qty 2

## 2016-07-10 MED ORDER — LORAZEPAM 2 MG/ML IJ SOLN
1.5000 mg | INTRAMUSCULAR | Status: DC | PRN
Start: 2016-07-10 — End: 2016-07-18
  Administered 2016-07-10 – 2016-07-11 (×3): 1.5 mg via INTRAVENOUS
  Filled 2016-07-10 (×2): qty 1

## 2016-07-10 MED ORDER — QUETIAPINE FUMARATE 50 MG OR TABS
50.0000 mg | ORAL_TABLET | Freq: Three times a day (TID) | ORAL | Status: DC
Start: 2016-07-10 — End: 2016-07-11
  Administered 2016-07-10 – 2016-07-11 (×4): 50 mg via ORAL
  Filled 2016-07-10 (×3): qty 1

## 2016-07-10 MED ORDER — HEPARIN 25,000 UNITS/250 ML 1/2 NS PREMIX INFUSION (~~LOC~~)
1300.0000 [IU]/h | INTRAMUSCULAR | Status: DC
Start: 2016-07-10 — End: 2016-07-10
  Administered 2016-07-10 (×6): 1300 [IU]/h via INTRAVENOUS

## 2016-07-10 MED ORDER — OXYCODONE HCL 5 MG OR TABS
5.0000 mg | ORAL_TABLET | ORAL | Status: DC
Start: 2016-07-10 — End: 2016-07-11
  Administered 2016-07-10 – 2016-07-11 (×5): 5 mg via ORAL
  Filled 2016-07-10 (×4): qty 1

## 2016-07-10 MED ORDER — OLANZAPINE 10 MG IM SOLR
5.0000 mg | Freq: Once | INTRAMUSCULAR | Status: DC
Start: 2016-07-10 — End: 2016-07-10
  Filled 2016-07-10: qty 5

## 2016-07-10 MED ORDER — SODIUM CHLORIDE 0.9 % IV SOLN
20.00 meq | Freq: Once | INTRAVENOUS | Status: AC
Start: 2016-07-10 — End: 2016-07-10
  Administered 2016-07-10 (×2): 20 meq via INTRAVENOUS
  Filled 2016-07-10: qty 10

## 2016-07-10 MED ORDER — FUROSEMIDE 10 MG/ML IJ SOLN
20.0000 mg | Freq: Once | INTRAMUSCULAR | Status: DC
Start: 2016-07-10 — End: 2016-07-10

## 2016-07-10 MED ORDER — HEPARIN 25,000 UNITS/250 ML 1/2 NS PREMIX INFUSION (~~LOC~~)
1500.0000 [IU]/h | INTRAMUSCULAR | Status: DC
Start: 2016-07-10 — End: 2016-07-10
  Administered 2016-07-10 (×2): 1500 [IU]/h via INTRAVENOUS
  Filled 2016-07-10: qty 250

## 2016-07-10 MED ORDER — LORAZEPAM 2 MG/ML IJ SOLN
2.0000 mg | Freq: Once | INTRAMUSCULAR | Status: DC
Start: 2016-07-10 — End: 2016-07-10
  Filled 2016-07-10: qty 1

## 2016-07-10 NOTE — Consults (Signed)
[facility] [place], [place]  ACUTE PAIN CONSULT NOTE    CHIEF COMPLAINT: Left leg pain, chronic pain    HISTORY OF PRESENT ILLNESS: Hadrian Yarbrough is a 40 year old male 40 year old ex smoker with a history of traumatic left lower extremity injury 3 years ago who underwent left lower extremity angioplasty, left popliteal-distal posterior tibial bypass with LLE GSV harvest 07/06/16 by Dr Rae Halsted and re-do bypass by Dr Sherre Poot 07/07/16.  Patient was kept intubated until yesterday.  He is currently under the CIWA protocol with 32 mg of Ativan over the past 24 hours for control of withdrawal symptoms.  Per his wife, he drinks up to four 24 oz cans of beer on a nightly basis.  Patient is currently on Precedex at 1 microgram/kilogram per hour and appears comfortable.  Of note patient has a history of PTSD which has contributed to his chronic pain and addiction symptoms.  He takes approximately 1 tab of Percocet per week as well as Amitriptyline on a nightly basis.  His wife describes multiple relapses with heavy alcohol use followed by inpatient admissions for control of withdrawal symptoms.  On exam, patient is appropriately somnolent but is able to answer basic questions and protect his airway. Dilaudid PCA is onboard but not being used.    Reports pain is 7/10, located in the left leg, worse with movement, better with current medications.    Baseline pain history: Average pain score at home: unable to assess. Outpatient pain MD is his PCP.    PAST MEDICAL HISTORY:   No past medical history on file.  Left leg claudication    PAST SURGICAL HISTORY:   No past surgical history on file.    ALLERGIES:   Allergies   Allergen Reactions    No Known Allergies [Other] Other     Received name: No Known Allergies       OUTPATIENT MEDICATIONS:   No current facility-administered medications on file prior to encounter.      Current Outpatient Prescriptions on File Prior to Encounter    Medication Sig Dispense Refill    ASPIRIN 81 PO Take 81 mg by mouth.      gabapentin (NEURONTIN) 300 MG capsule Take 300 mg by mouth 3 times daily.      HYDROcodone-acetaminophen (NORCO) 5-325 MG tablet Take 1 tablet by mouth every 12 hours as needed for Severe Pain (Pain Score 7-10). 45 tablet 0    pentoxifylline (TRENTAL) 400 MG Controlled-Release tablet Take 400 mg by mouth.         CURRENT MEDICATIONS:  Current Facility-Administered Medications   Medication Dose Route Frequency Provider Last Rate Last Dose    aspirin chewable tablet 81 mg  81 mg Oral Daily Arman Bogus, MD   81 mg at 07/10/16 0846    ceFAZolin in D5W (ANCEF) IVPB 2,000 mg  2,000 mg IntraVENOUS Sherene Sires, MD 200 mL/hr at 07/10/16 1336 2,000 mg at 07/10/16 1336    chlordiazePOXIDE (LIBRIUM) capsule 75 mg  75 mg Oral Q8H Earley Abide, MD        dexmedetomidine (PRECEDEX) 400 mcg in sodium chloride 0.9 % 100 mL infusion  0-2 mcg/kg/hr IntraVENOUS Continuous Alben Spittle, MD 25.5 mL/hr at 07/10/16 0849 1 mcg/kg/hr at 07/10/16 0849    dextrose-sodium chloride 5%-0.45% 1,000 mL with potassium chloride 20 mEq, thiamine 660 mg, folic acid 1 mg, multivitamin (adult) 10 mL infusion   IntraVENOUS Q24H Earley Abide, MD  100 mL/hr at 07/10/16 1600      docusate (COLACE) liquid 250 mg  250 mg NG Tube Daily Arman Bogus, MD   250 mg at 07/10/16 0846    famotidine (PEPCID) injection 20 mg  20 mg IntraVENOUS Q12H Earley Abide, MD   20 mg at 07/10/16 0846    HYDROmorphone (DILAUDID) injection 0.5 mg  0.5 mg IntraVENOUS Q2H PRN Earley Abide, MD   0.5 mg at 07/10/16 1357    influenza vaccine >=3 yrs injection 0.5 mL  0.5 mL IntraMUSCULAR Prior to discharge Rae Halsted, Nii-Kabu, MD        lidocaine (XYLOCAINE) 2 % topical jelly   Topical Once Loletta Specter, MD        LORazepam (ATIVAN) injection 1.5 mg  1.5 mg IntraVENOUS Q30 Min PRN Alycia Rossetti, MD        Or     LORazepam (ATIVAN) injection 3 mg  3 mg IntraVENOUS Q30 Min PRN Alycia Rossetti, MD   3 mg at 07/10/16 1615    multivitamin adult (Tab-A-Vite/Beta Carotene) tablet 1 tablet  1 tablet Oral Daily Earley Abide, MD        ondansetron [facility]) injection 4 mg  4 mg IntraVENOUS Q6H PRN Hollie Salk, MD   4 mg at 07/07/16 1806    oxyCODONE (ROXICODONE) tablet 5 mg  5 mg Oral Q4H Earley Abide, MD        povidone-iodine 10 % swab 1 Application  1 Application Topical BID Al-Khouja, Rochele Raring, MD   1 Application at 70/26/37 0900    quetiapine (SEROQUEL) tablet 50 mg  50 mg Oral Q8H Earley Abide, MD        sodium chloride 0.9% infusion   IntraVENOUS Once PRN Mayers, Ronaldo Miyamoto, DO           Pain medication use past 24hours:  dexmedeTOMidine (PRECEDEX) 449mg/100mL infusion, Last Rate: 1 mcg/kg/hr (07/10/16 0849)    aspirin, 81 mg, Oral    HYDROmorphone, 0.5 mg, IntraVENOUS    oxyCODONE, 5 mg, Oral  Current Facility-Administered Medications   Medication Dose Frequency    chlordiazePOXIDE  75 mg Q8H    LORazepam  1.5 mg Q30 Min PRN    Or    LORazepam  3 mg Q30 Min PRN       SOCIAL HISTORY:   Social History     Social History    Marital status: Married     Spouse name: N/A    Number of children: N/A    Years of education: N/A     Social History Main Topics    Smoking status: Former Smoker    Smokeless tobacco: Former [employer] developer   Alcohol use Not on file    Drug use: Not on file    Sexual activity: Not on file     Social Activities of Daily Living Present    Not on file     Social History Narrative    No narrative on file       FAMILY HISTORY:  No family history on file.    REVIEW OF SYSTEMS:    Denies Fever   Denies tinnitus  Denies chest pain  Denies difficulty breathing  Denies indigestion  Denies muscle pain  Denies headache  Unable to assess depression symptoms fully  Denies hives  Denies Skin Rash      PHYSICAL EXAMINATION:   Vital Signs:    07/10/16  1400 07/10/16   1500 07/10/16  1515 07/10/16  1600  BP:    140/56   Pulse: 149 162 114 115   Resp: (!) 38 (!) 48 29 19   Temp:       SpO2: 100% 100% 100% 100%       General Appearance: somnolent but arousable, no acute distress, lying flat in bed  HEENT: EOMI  Respiratory: Good chest wall excursion. Breathing unlabored  Neurologic: asleep, arousable, oriented   GU: Voids via foley  GI: Abdomen non-distended  Musculoskeletal: Moves all extremities  Psych: Unable to assess due to sedation  Skin: No active lesions    DIAGNOSTIC DATA:  Lab Results   Component Value Date    WBCCOUNT 17.1 (H) 07/10/2016    RBC 2.96 (L) 07/10/2016    HGB 9.3 (L) 07/10/2016    MCV 93.5 07/10/2016    MCH 31.6 07/10/2016    MCHC 33.8 07/10/2016    RDW 14.9 (H) 07/10/2016    PLCTEL 153 07/10/2016    MPVH 11.0 07/10/2016    DTYPE MANUAL DIFFERENTIAL PERFORMED 07/10/2016    SEGP 80.9 07/10/2016    SEGAB 13.8 (H) 07/10/2016    BANDP 4.8 07/06/2016    BANDA 0.9 (H) 07/06/2016    LYMPP2 10.5 07/10/2016    LYMPAB 1.8 07/10/2016    MONP2 7.6 07/10/2016    MONOA 1.3 (H) 07/10/2016    EOSNP2 1.0 07/10/2016    EOSA 0.2 07/10/2016    BASOP2 0.0 07/10/2016    BASOA 0.0 07/10/2016    RMORP VERIFIED 07/10/2016    PMORP NORMAL 07/10/2016     Lab Results   Component Value Date    BUN 8 07/10/2016    CREAT 0.8 07/10/2016    CL 109 07/10/2016    K 3.4 07/10/2016    [place] 7.9 07/10/2016    TBILI 0.3 07/06/2016    ALB 4.4 07/06/2016    AST 16 07/06/2016    ALK 57 07/06/2016    BICARB 26.4 07/10/2016    ALT 13 07/06/2016    GLU 112 07/10/2016     PT   PTT 42.0* (12/05)   INR 1.08 (12/02)       Coags reviewed: Yes    ASSESSMENT AND PLAN:  Doss Cybulski is a 40 year old male who presents with left lower extremity pain and withdrawal symptoms related to chronic alcohol dependence and recent left lower extremity vascular surgical procedures.  Patient is currently on a Precedex infusion and was very somnolent on exam.  The majority of his history was gathered from his  wife.  He currently appears comfortable with the current dose of Precedex and p.r.n. Ativan for withdrawal symptoms based on the CIWA protocol.  Dilaudid PCA has not been needed thus far.  We anticipate that once he is more awake and alert, he will need opioids for pain control.  We discussed multimodal pain management options with his wife at the bedside.  Further discussion with the patient to follow once he is more awake.    Based on the patient's history, physical examination, and review of the available medical chart we recommend the following:  # continue Ativan per CIWA protocol  # Start oxycodone 5-10 mg q4hr PRN for moderate-severe pain  # Continue Dilaudid 0.5 mg IVP PRN for breakthrough pain  # Start Robaxin 531m q8hr for muscle spasm  # Start PO Tylenol 650 mg q6hr standing  # Start Gabapentin 300 mg q8hr for neuropathic pain  # Continue Ativan per CIWA protocol  # discussed plan with patient  and family at bedside, please contact [facility] once patient is off of Precedex infusion if further recommendations are needed.    -Bobak Shonna Chock, MD    Patient seen and discussed with Attending, Dr. Meda Coffee    ATTENDING SUPERVISION NOTE:  This is a 40 year old male who was seen on the Acute Pain Service for management of pain.   Physical exam findings include somnolent male in bed, mild respiratory obstruction noted.       I have interviewed and examined the patient at [facility] and I have discussed my findings and recommendations with the patient and the resident/fellow. I have reviewed the fellow/resident's note including the history, medications, and physical examination.  I concur with the assessment and plan. Please see the fellow/resident note for further details.      ASSESSMENT:    ICD-10-CM ICD-9-CM   1. Peripheral vascular disease (CMS-HCC) I73.9 443.9

## 2016-07-10 NOTE — Plan of Care (Signed)
Problem: Promotion of health and safety  Goal: Promotion of Health and Safety  The patient remains safe, receives appropriate treatment and achieves optimal outcomes (physically, psychosocially, and spiritually) within the limitations of the disease process by discharge.   Outcome: Goal Met      Comments:   Nursing Shift Summary    No data found.    No data found.      No data found.    Shift Comments for the past 12 hrs:   Comments   07/10/16 1420 patient is very agitated. He is cussing and trying to get out of bed, he is hittting his left leg on siderail. pulling on his  catheter . He has received Ativan as ordered with no sign of calming down  Dr Alfonzo Beers paged no answer at this time.    07/10/16 1453 Dr Charlestine Night to bedside at this time

## 2016-07-10 NOTE — Progress Notes (Signed)
Progress Note    Patient Name: Jerome Hunt  MRN: [id number]  Room#: 6232/6232-01    Service: [place] Attending Provider: Wyn Forster, MD    Mechanism of Injury: LLE arterial insufficiency    Date: 07/10/16  Hospital Day:   4 days - Admitted on: 07/06/2016  Post Operative Day(s): 1 Day Post-Op    left lower extremity angioplasty, left popliteal-distal posterior tibial bypass with LLE GSV harvest 07/06/16  Revision left transinterossesous below-knee popliteal-peroneal and sequential peroneal-distal posterior tibial-medial tarsal reverse saphenous vein bypass  Left distal segmental fibula resection to expose distal peroneal artery, thrombectomy of left below-knee popliteal-posterior tibial reverse saphenous vein bypass 07/07/16    SUBJECTIVE: Jerome Hunt is a 40 year old ex smoker with a history of traumatic left lower extremity injury 3 years ago underwent left lower extremity angioplasty, left popliteal-distal posterior tibial bypass with LLE GSV harvest 07/06/16 by Dr Rae Halsted and re-do bypass by Dr Sherre Poot 07/07/16    Overnight events: Patient extubated last night, continues to be severely agitated at times. Awake, following commands. Maintaining adequate urine output. Not quite neurologically intact.    OBJECTIVE:     GENERAL: NAD, severely agitated at times  HEENT: NC/AT EOMI MMM   NECK: supple  ABDOMEN: soft, non-distended  GROIN: no evidence of hematoma at right femoral access site, pico dressing in place left groin  EXTREMITY: LLE with dressing c/d/i,  dopplerable AT pulse  LLE drain in place, serosanguinous output  LLE warm to touch until dorsum of foot, palpable pulses RLE  LUE a line in place  Foley catheter in place, clear urine output      Lines, Drains and Airways:  Peripheral IV - 20 G Left Arm (Active)   Site Assessment Phlebitis 0 07/07/2016  4:00 AM   Line Lumen Status Infusing 07/07/2016  4:00 AM   Dressing Transparent 07/07/2016  4:00 AM   Dressing Status Clean, dry, intact 07/07/2016  4:00  AM       Peripheral IV - 18 G (Active)   Site Assessment Phlebitis 0 07/07/2016  4:00 AM   Line Lumen Status Infusing 07/07/2016  4:00 AM   Dressing Transparent 07/07/2016  4:00 AM   Dressing Status Clean, dry, intact 07/07/2016  4:00 AM       Arterial Line (ART) -  Left Radial (Active)   Site Assessment Phlebitis 0 07/07/2016  4:00 AM   Line Lumen Status Patent 07/07/2016  4:00 AM   Treatment Arm board on 07/07/2016  4:00 AM   Dressing Transparent 07/07/2016  4:00 AM   Dressing Status Clean, dry, intact 07/07/2016  4:00 AM       Vitals signs:     Latest Entry  Range (last 24 hours)    Temperature: 99.1 F (37.3 C)  Temp  Avg: 99.4 F (37.4 C)  Min: 98.7 F (37.1 C)  Max: 101.5 F (38.6 C)    Blood pressure (BP): 130/80  BP  Min: 100/64  Max: 141/77    Heart Rate: 107  Pulse  Avg: 99.4  Min: 90  Max: 141    Respirations: 20  Resp  Avg: 18.5  Min: 8  Max: 40    SpO2: 100 %  SpO2  Avg: 98.8 %  Min: 92 %  Max: 100 %       No Data Recorded       No Data Recorded       No Data Recorded     Weight: 101.9 kg (224 lb 10.4  oz)  Percentage Weight Change (%): 2.41 %    Labs:  CBC  Recent Labs      07/10/16   0050  07/10/16   0653   HGB  7.0*  8.1*   HCT  20.8*  24.0*        Chemistry  Recent Labs      07/08/16   0519  07/09/16   0344   07/09/16   1609  07/10/16   0340   K  3.9  3.7   --    --   3.4*   CL  110*  108*   --    --   109*   BICARB   --    --    < >  27.9*  27.0   BUN  9  10   --    --   8   CREAT  0.7  0.8   --    --   0.8   GLU  132*  122*   --    --   112   [place]  7.8*  7.8*   --    --   7.9*   MG  2.1  2.1   --    --    --    PHOS  2.1*  3.3   --    --    --     < > = values in this interval not displayed.     No results for input(s): ALK, AST, ALT, TBILI, DBILI, ALB in the last 72 hours.    Coags  Recent Labs      07/09/16   1642  07/10/16   0218   PTT  29.0  99.6*       Medications:  Scheduled Meds  . aspirin  81 mg Daily   . potassium chloride-thiamine-multivitamin-folic acid infusion builder   Q24H   . docusate  250  mg Daily   . famotidine  20 mg Q12H   . gabapentin  300 mg TID   . influenza vaccine >=3 yrs  0.5 mL Prior to discharge   . multivitamin adult  1 tablet Daily   . povidone-iodine  1 Application BID   . potassium chloride-thiamine-multivitamin-folic acid infusion builder   Q24H     PRN Meds  . HYDROmorphone  0.5 mg Q2H PRN   . LORazepam  2 mg Q1H PRN    Or   . LORazepam  4 mg Q1H PRN   . ondansetron  4 mg Q6H PRN   . sodium chloride   Once PRN     IV Meds  . dexmedeTOMidine (PRECEDEX) 460mg/100mL infusion 1 mcg/kg/hr (07/10/16 0849)   . heparin (porcine) 1,300 Units/hr (07/10/16 0600)       Allergies:  Allergies   Allergen Reactions   . No Known Allergies [Other] Other     Received name: No Known Allergies       ASSESSMENT / PLAN / RECOMMENDATIONS:  Neuro:  Extubated, agitated periodically  Continue CIWA protocol, precedex  As per SICU  Will obtain CT head as patient not neurologically appropriate    CV:BP  Min: 100/64  Max: 141/77  Pulse  Avg: 99.4  Min: 90  Max: 141  Continue to monitor  q1h vascular checks     Pulmonary:Resp  Avg: 18.5  Min: 8  Max: 40  SpO2  Avg: 98.8 %  Min: 92 %  Max: 100 %    Non-labored respirations  FEN/GI:  Follow-up swallow eval, if patient cannot pass will place KFT  GI PPX    Renal:   Intake/Output       07/09/16 0600 - 07/10/16 0559 07/10/16 0600 - 07/11/16 0559      1916-6060 1800-0559 Total 0600-1759 0459-9774 Total       Intake    I.V.  2432.1  713.3 3145.4  19.5  -- 19.5    PRBCs  --  400 400  --  -- --    Drain (Flush)  --  30 30  --  -- --    Total Intake 2432.1 1143.3 3575.4 19.5 -- 19.5       Output    Urine  1100  845 1945  445  -- 445    Drains  50  20 70  --  -- --    Total Output 1150 865 2015 445 -- 445       Net I/O     1282.1 278.3 1560.4 -425.5 -- -425.5      Will give additional IV lasix 74m, as patient fluid overload    ID: Temp  Avg: 99.4 F (37.4 C)  Min: 98.7 F (37.1 C)  Max: 101.5 F (38.6 C)  Received peri-op abx, will restart ancef   WBC 15.1  from 13.5 yesterday  Follow-up cultures    Hematology:  H/H down trending, received 1uPRBC   Heparin gtt    Endocrine:  Monitor blood sugars    Prophylaxis:  SCD RLE   Heparin gtt  GI ppx  Bowel regimen  D/C Foley catheter this afternoon    Dispo:  SICU    I attest that I had personally interviewed and examined Jerome Chiarelliduring this encounter on 07/10/2016 and have reviewed all of the available diagnostic studies along with the resident physician.  I,Gentry Fitz MD, agree with the recorded history, physical examination and evaluation above and have supervised formulation of the stated impression, recommendations and care plan.    CGentry Fitz MD  08/27/2019 8:41 PM

## 2016-07-10 NOTE — Progress Notes (Addendum)
Progress Note  Surgical Intensive Care Unit    Patient Name: Jerome Hunt  MRN: [id number]     Admitting Service: [place] Attending Provider: Rae Halsted    Mechanism of Injury: <principal problem not specified>    Date: 07/10/16  Hospital Day:   4 days - Admitted on: 07/06/2016    Injuries/Problems: Problem List:  2017-12: Peripheral vascular disease (CMS-HCC)  2017-11: Peripheral artery disease (CMS-HCC)      OVERNIGHT: extubated on precedex. Hg 7.0 received 1 uPRBCs    OBJECTIVE:   Physical Exam:  Intubated, sedated  intermiten tachy  No resp distress  abd soft ND  L groin pico in place; incision on LLE in tact; foot warm; cannot palpate pulse over graft; doppler AT  R foot with doppler DP        Vitals signs:     Latest Entry  Range (last 24 hours)    Temperature: 99.1 F (37.3 C)  Temp  Avg: 99.5 F (37.5 C)  Min: 98.7 F (37.1 C)  Max: 101.5 F (38.6 C)    Blood pressure (BP): 130/67  BP  Min: 100/64  Max: 143/69    Heart Rate: 95  Pulse  Avg: 97.7  Min: 90  Max: 141    Respirations: 16  Resp  Avg: 16.8  Min: 8  Max: 36    SpO2: 100 %  SpO2  Avg: 99.2 %  Min: 92 %  Max: 100 %       No Data Recorded       No Data Recorded       No Data Recorded     Weight: 101.9 kg (224 lb 10.4 oz)  Percentage Weight Change (%): 2.41 %    I&O:  Intake/Output       07/09/16 0600 - 07/10/16 0559 07/10/16 0600 - 07/11/16 0559      7048-8891 1800-0559 Total 0600-1759 6945-0388 Total       Intake    I.V.  2432.1  713.3 3145.4  19.5  -- 19.5    PRBCs  --  400 400  --  -- --    Drain (Flush)  --  30 30  --  -- --    Total Intake 2432.1 1143.3 3575.4 19.5 -- 19.5       Output    Urine  1100  845 1945  45  -- 45    Drains  50  20 70  --  -- --    Total Output 1150 865 2015 45 -- 45       Net I/O     1282.1 278.3 1560.4 -25.5 -- -25.5            Labs:  CBC  Recent Labs      07/09/16   1950  07/10/16   0050   WBCCOUNT  13.5*  15.1*   HGB  7.1*  7.0*   PLCTEL  141*  143*   NEUTP  74.6  74.5          Chemistry  Recent Labs       07/08/16   0519  07/09/16   0344  07/10/16   0340   SODIUM  141  140  140   K  3.9  3.7  3.4*   CL  110*  108*  109*   CO2  _0 BUN  _1 CREAT  0.7  0.8  0.8  GLU  132*  122*  112   [place]  7.8*  7.8*  7.9*   MG  2.1  2.1   --    PHOS  2.1*  3.3   --      No results for input(s): ALK, AST, ALT, TBILI, DBILI, ALB in the last 72 hours.    Coags  Recent Labs      07/09/16   1642  07/10/16   0218   PTT  29.0  99.6*       Medications:  Scheduled Meds   aspirin  81 mg Daily    docusate  250 mg Daily    famotidine  20 mg Q12H    furosemide  20 mg Once    gabapentin  300 mg TID    influenza vaccine >=3 yrs  0.5 mL Prior to discharge    multivitamin adult  1 tablet Daily    potassium acetate IVPB  20 mEq Once    povidone-iodine  1 Application BID    potassium chloride-thiamine-multivitamin-folic acid infusion builder   Q24H     PRN Meds   LORazepam  2 mg Q1H PRN    Or    LORazepam  4 mg Q1H PRN    ondansetron  4 mg Q6H PRN    sodium chloride   Once PRN     IV Meds   dexmedeTOMidine (PRECEDEX) 438mg/100mL infusion 1 mcg/kg/hr (07/10/16 0600)    dextrose-sodium chloride 5%-0.9% 150 mL/hr at 07/10/16 0600    heparin (porcine) 1,300 Units/hr (07/10/16 0600)    HYDROmorphone         Allergies:  Allergies   Allergen Reactions    No Known Allergies [Other] Other     Received name: No Known Allergies       ASSESSMENT / PLAN / RECOMMENDATIONS:  26M with a traumatic left lower extremity injury 3 years prior to presents with pain to left lower extremity concerning for claudication  S/p left popliteal to distal PT bypass  S/p Bypass revision, left popliteal to peroneal and peroneal to PT jump graft    07/06/16  LEFT POPLITEAL TO DISTAL POSTERIOR TIBIAL ARTERY BYPASS LEFT SAPHENOUS VEIN HARVEST AORTOGRAM LEFT LEG ANGIOGRAM LEFT POPLITEAL ARTERY ANGIOPLASTY (4X4, 5X4) LEFT DISTAL POSTERIOR TIBIAL ARTERY ANGIOPLASTY       07/07/16  Redo Left transinterosseous below-knee popliteal-peroneal and sequential  peroneal-distal posterior tibal-medial tarsal reverse saphenous vein bypass; Left distal segmental fibula resection to expose distal peroneal artery,  Thrombectomy of left below-knee popliteal-posterior tibial reverse saphenous vein bypass    Neuro:  GCS:  GCS 13 3/4/6   Mental status: A&Ox1. agitation off sedation  - on precedex 188m/kg/hr  - wean off sedation as pt tolerates  - CIWA for h/o etoh Abuse and withdrawal ativan scale increased  -aspirin  -gabapentin  -pt unable to use dilaudid pca, dilaudid pushes  -Head CT    CV:BP  Min: 100/64  Max: 143/69  Pulse  Avg: 97.7  Min: 90  Max: 141  120s-130s/60s  HR 90s  -EKG/Trop    #LLE claudication s/p bypass   - bypass likely thrombosed again  - arterial duplex 07/08/16: There is distal arterial flow noted in the posterior tibial artery. The bypass graft is not visualized.Mild stenosis at the left mid-distal SFA.  - ABI with AT on LLE is 0.63 07/08/16  - heparin gtt 1300/hr  - q1 neurovscular checks  - pulse ox monitor on LLE (currently at 100%)     Pulmonary:  On 2L NC sating 100%. Wean off as tolerated  - f/u CXR 12/5                FEN/GI:  Abd exam: soft  D5NS _0 /hr  Keo feed placed, start tube feeds  Diet:NPO Reason: Surgery/Procedure; Tube Feed: Hold while NPO (if ordered)   Colace  pepcid  K 3.4, repleted  LFTs, ammonia      Renal:   Intake/Output Summary (Last 24 hours) at 07/10/16 0700  Last data filed at 07/10/16 0600   Gross per 24 hour   Intake          3372.52 ml   Output             1910 ml   Net          1462.52 ml   Lasix 20 12/4 and 12/5  UOP: adequate at 1.8L -> 75cc/hr  Drain 70  Cr 0.8  Condom cath    Hematology:   hb 10.6 -> 8.9 ->8.0 -> 7.0  Hg 7.0 received 1 uPRBCs  - f/u CBC 2pm  -- hep gtt    ID: Temp  Avg: 99.5 F (37.5 C)  Min: 98.7 F (37.1 C)  Max: 101.5 F (38.6 C)  Afebrile WBC 15.1 (11.4)  Micro: BCx 12/1 NGTD  -BCx, UCx,   -STD labs    Endocrine:  FSBS: < 180    MSK:  PT/OT:  No  Weight Bearing Status:  weight bear as tolerated   -ABI 0.6      ICU Care:    Feedings: No  Analgesia: Yes  Sedation: Yes  Thromboprophylaxis:SCDs, early mobility protocol and subcutaneous heparin  Head-of-bed elevation: Yes  Ulcer prophylaxis: yes  Glycemic control: no  GI Bowel care: Yes  Indwelling catheter removal: yes  ID: de-escalation of Abx: n/a  Spontaneous breathing trial: N/A    Family discussion: yes    Recommendations:  - cont hep gtt  - q1 neurovascular checks and pulse ox on the LLE  - repeat CBC 2pm  - EKG/troponin  - head CT  - banana bag  - BCx, UCx, CXR  - LFTs, ammonia  - STD labs  - place keo feed, start tube feeds  - condom cath     DISPO: SICU    SICU Attending Attestation:    Remained in SICU overnight secondary to above diagnoses and due to concern for possible sudden deterioration. Critical care time =  31 minutes today, excluding teaching and procedures.  Patient met criteria for critical illness and ICU level of care during my evaluation and treatment due to the following diagnoses and/or management issues:    1. Altered mental status - suspect ETOH withdrawal, currently more calm on dexmedetomidine  2. mild hypoxia present. Continue to monitor SpO2 level and titrate supplemental oxygen to maintain SPO2 = 93% -100% as tolerated. Lasix today as CXR shows increasing opacificationn bilaterally  Acute blood loss anemia - continue to closely monitor hemoglobin level.  Received 1 u PRBCS yesterday    Charyl Bigger. Rashawna Scoles MD  [facility] Clinical Professor  Department of Surgery

## 2016-07-10 NOTE — Plan of Care (Signed)
Problem: Promotion of health and safety  Goal: Promotion of Health and Safety  The patient remains safe, receives appropriate treatment and achieves optimal outcomes (physically, psychosocially, and spiritually) within the limitations of the disease process by discharge.   Outcome: Progressing toward goal, anticipate improvement over: next 12-24 hours   07/10/16 0622   Adult/Peds Plan of Care   Standard of Care/Policy Critical Care SOC;[facility];Restraints SOC;Falls reduction   Outcome Evaluation (rationale for progressing/not progessing continue to keep patient safe and reorient him.    Patient Specific Goal, Promotion of Health & Safety continue to wean precedex off.    Additional Individualized Interventions/Recommendations  continue to use ciwa protocol     Nursing Shift Summary    Provider Notification for the past 12 hrs:   Provider Name Reason for Communication Provider Response   07/10/16 0153 Vance GatherShelley mathiel- Critical H&H 7 and 20.8 contine to monitor for now, will transfuse in the AM       Critical Value Notification for the past 12 hrs:   Lab Test Name Test Result Provider Name Comments   07/10/16 0330 aPTT 99.6 pharmacist long hold heparin and then re start at a lower rate         Rounding for the past 12 hrs:   Physician Rounding   07/10/16 0200 My nursing colleague or I joined the physician during  his/her interaction/rounds with the patient at least once during my shift       Shift Comments for the past 12 hrs:   Comments   07/10/16 0200 md requested for continous pulse ox pt's left foot, informed that his previous pulse ox on his left foot has poor signal.  md is aware and would still like it at bedside   07/10/16 0500 blood started [id number]       Continues to be extremely agitated.  Given one unit of prbc during the night per dr. Tomi BambergerMayers.  Keeping pt safe by using restraints so he wont pull his iv ou or his aline and continuing ciwa protocol due to patient's history of alcohol abuse

## 2016-07-10 NOTE — Interdisciplinary (Signed)
Social Work Assessment, Adult        Patient Name:  Jerome Hunt   MRN: [id number]   Date of Birth: 1976/06/24    Age: 40 year old   Date of Admission:  07/06/2016         Service Date: July 10, 2016     Assessment  Assessment Type: Progress/Follow-up   Referral Information  Referral Type: Other (Comment) Drug/Alcohol Counseling     CSW for pt follow up support and resources.     CSW remains available to address above referral. Per chart review, pt now extubated and agitated periodically; on CIWA protocol. Per MD note, CT of head requested as patient not neurologically appropriate.     Will continue to follow during inpatient stay.     CSW to remain available should needs arise. Duwaine MaxinSusana Zaahir Pickney, MSW p [phone]                                                                                                  Discharge Information:                Duwaine MaxinSusana Briton Sellman, MSW     Date: 07/10/2016    Time: 10:53 AM

## 2016-07-10 NOTE — Interdisciplinary (Signed)
SLP Contact       07/10/16 1436    Therapy Contact  Note    Contact Time 1436    Additional Comments Consult received for dysphagia evaluation. Attempted to see patient for evaluation, however he is too agitated to participate. Per discussion w/ RN, patient going through alcohol withdrawal. ST to follow up tomorrow.

## 2016-07-10 NOTE — Progress Notes (Signed)
Inpatient Heparin Drug Monitoring Note    Jerome Hunt is a 40 year old male on anticoagulation therapy for left popliteal-distal posterior tibial bypass with LLE GSV harvest 07/06/16 .    PMH:  Traumatic left lower extremity 2014  Claudication    aPTT Goal:   65 - 90 seconds    Dosing weight = 99.5kg     Current Heparin Rate:     Dose (Units/hr) Heparin: 1300 Units/hr      Labs:    PTT (last 24 hours):  -43.9 @0830  on 12/2  - 83.1 @ 0108 on 12/4  - 98.6 @0636  on 12/4  - 99.6 @0410  on 12/5    Labs:  (Last 3 days):    Recent Labs      07/08/16   0519   07/09/16   0344   07/09/16   1950  07/10/16   0050  07/10/16   0340  07/10/16   0653   HGB  8.9*   < >  8.0*   < >  7.1*  7.0*   --   8.1*   HCT  25.6*   < >  23.5*   < >  21.1*  20.8*   --   24.0*   CREAT  0.7   --   0.8   --    --    --   0.8   --     < > = values in this interval not displayed.       Labs (Last values):  Albumin   Date Value Ref Range Status   07/06/2016 4.4 4.2 - 5.5 G/DL Final     AST   Date Value Ref Range Status   07/06/2016 16 13 - 39 U/L Final     ALT   Date Value Ref Range Status   07/06/2016 13 7 - 52 U/L Final     Bilirubin, Total   Date Value Ref Range Status   07/06/2016 0.3 0.0 - 1.4 mg/dL Final       Drug/Disease Interactions and Assessments:    - Other Anticoagulants:   Current Facility-Administered Medications   Medication Dose Frequency    heparin (porcine)  1,300 Units/hr Continuous       - Bleeding Assessment:  H/H is stable. Patient does not exhibit any signs or symptoms of bleeding (spoke to RN).     - Current Nutrition Assessment:   Recent changes in nutrition/diet? no  NPO Reason: Surgery/Procedure; Tube Feed: Hold while NPO (if ordered)    Assessment/Plan:  - PTT=99.6 seconds is above therapeutic goal range.   - Will HOLD heparin x30 minutes and decrease maintenance infusion to 1300 units/hour   - Check aPTT every 6 hours, to be drawn at 10:00  - Continue to monitor for signs and symptoms of bleeding     Pharmacy will continue to monitor closely. For questions please call [phone]    Ardeth PerfectSteven Elia Reather Steller, [facility] HospitalHARMD

## 2016-07-10 NOTE — Progress Notes (Signed)
Patient became combative in the ICU due to alcohol withdrawal. During the time of his extreme agitation he pulled several lines and thrashed around on the bed.  Given the failed bypass graft we feel that the risk of stroke by keeping him on the heparin in his agitated state is greater than the benefit of any therapeutic anticoagulation. Therefore the heparin gtt was discontinued. The wife was notified.
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
## 2016-07-11 ENCOUNTER — Inpatient Hospital Stay: Payer: No Typology Code available for payment source

## 2016-07-11 DIAGNOSIS — R2243 Localized swelling, mass and lump, lower limb, bilateral: Secondary | ICD-10-CM

## 2016-07-11 LAB — TYPE, SCREEN & CROSSMATCH
ABO/Rh(D): B POS
Antibody Screen Result: NEGATIVE
Unit Division: 0
Units Ordered: 1

## 2016-07-11 LAB — CBC WITH DIFF, BLOOD
ANC automated: 9.2 10*3/uL — ABNORMAL HIGH (ref 2.0–8.1)
Basophils %: 0.3 %
Basophils Absolute: 0 10*3/uL (ref 0.0–0.2)
Eosinophils %: 2.2 %
Eosinophils Absolute: 0.3 10*3/uL (ref 0.0–0.5)
Hematocrit: 23.7 % — ABNORMAL LOW (ref 39.5–50.0)
Hgb: 8.3 G/DL — ABNORMAL LOW (ref 13.5–16.9)
Lymphocytes %: 18.9 %
Lymphocytes Absolute: 2.5 10*3/uL (ref 0.9–3.3)
MCH: 32.3 PG (ref 27.0–33.5)
MCHC: 35 G/DL (ref 32.0–35.5)
MCV: 92.4 FL (ref 81.5–97.0)
MPV: 9.5 FL (ref 7.2–11.7)
Monocytes %: 9.7 %
Monocytes Absolute: 1.3 10*3/uL — ABNORMAL HIGH (ref 0.0–0.8)
Neutrophils % (A): 68.9 %
PLT Count: 174 10*3/uL (ref 150–400)
RBC: 2.56 10*6/uL — ABNORMAL LOW (ref 3.70–5.00)
RDW-CV: 14.6 % — ABNORMAL HIGH (ref 11.6–14.4)
White Bld Cell Count: 13.3 10*3/uL — ABNORMAL HIGH (ref 4.0–10.5)

## 2016-07-11 LAB — LIVER PANEL, BLOOD
ALT: 20 U/L (ref 7–52)
AST: 58 U/L — ABNORMAL HIGH (ref 13–39)
Albumin: 3.3 G/DL — ABNORMAL LOW (ref 4.2–5.5)
Alk Phos: 48 U/L (ref 34–104)
Bilirubin, Direct: 0.3 mg/dL — ABNORMAL HIGH (ref 0.0–0.2)
Bilirubin, Total: 1 mg/dL (ref 0.0–1.4)
Protein, Total: 5.8 G/DL — ABNORMAL LOW (ref 6.0–8.3)

## 2016-07-11 LAB — MAGNESIUM, BLOOD
Magnesium: 1.4 mg/dL — ABNORMAL LOW (ref 1.9–2.7)
Magnesium: 2.1 mg/dL (ref 1.9–2.7)

## 2016-07-11 LAB — PHOSPHORUS, BLOOD
Phosphorus: 2 MG/DL — ABNORMAL LOW (ref 2.5–5.0)
Phosphorus: 1.2 MG/DL — ABNORMAL LOW (ref 2.5–5.0)

## 2016-07-11 LAB — BASIC METABOLIC PANEL, BLOOD
BUN: 10 mg/dL (ref 7–25)
BUN: 11 mg/dL (ref 7–25)
CO2: 24 mmol/L (ref 21–31)
CO2: 24 mmol/L (ref 21–31)
Calcium: 8.3 mg/dL — ABNORMAL LOW (ref 8.6–10.3)
Calcium: 8.4 mg/dL — ABNORMAL LOW (ref 8.6–10.3)
Chloride: 103 mmol/L (ref 98–107)
Chloride: 105 mmol/L (ref 98–107)
Creat: 1 mg/dL (ref 0.7–1.3)
Creat: 1 mg/dL (ref 0.7–1.3)
Electrolyte Balance: 12 mmol/L (ref 2–12)
Electrolyte Balance: 8 mmol/L (ref 2–12)
Glucose: 87 mg/dL (ref 70–115)
Glucose: 146 mg/dL — ABNORMAL HIGH (ref 70–115)
Potassium: 2.9 mmol/L — ABNORMAL LOW (ref 3.5–5.1)
Potassium: 3.7 mmol/L (ref 3.5–5.1)
Sodium: 139 mmol/L (ref 136–145)
Sodium: 137 mmol/L (ref 136–145)
eGFR - high estimate: >60 (ref >59)
eGFR - high estimate: >60 (ref >59)
eGFR - low estimate: >60 (ref >59)
eGFR - low estimate: >60 (ref >59)

## 2016-07-11 LAB — PROTHROMBIN TIME, BLOOD
INR: 1.33 — ABNORMAL HIGH (ref 0.89–1.12)
Prothrombin Time: 16.4 s — ABNORMAL HIGH (ref 12.1–14.4)

## 2016-07-11 LAB — AMMONIA, PLASMA: Ammonia: 56 MCMOL/L — ABNORMAL HIGH (ref 16–53)

## 2016-07-11 LAB — SYPHILIS EIA SCREEN, BLOOD: T. Pallidum Ab: NONREACTIVE

## 2016-07-11 MED ORDER — PROPOFOL 1000 MG/100 ML INFUSION (~~LOC~~)
0.0000 ug/kg/min | INTRAVENOUS | Status: DC
Start: 2016-07-11 — End: 2016-07-12
  Administered 2016-07-12: 17:00:00 40 ug/kg/min via INTRAVENOUS
  Administered 2016-07-12: 30 ug/kg/min via INTRAVENOUS
  Administered 2016-07-12 (×2): 40 ug/kg/min via INTRAVENOUS
  Filled 2016-07-11 (×2): qty 100

## 2016-07-11 MED ORDER — OXYCODONE HCL 5 MG OR TABS
10.0000 mg | ORAL_TABLET | ORAL | Status: DC
Start: 2016-07-11 — End: 2016-07-12
  Administered 2016-07-11 – 2016-07-12 (×8): 10 mg via ORAL
  Filled 2016-07-11 (×7): qty 2

## 2016-07-11 MED ORDER — PROPOFOL 10 MG/ML BOLUS FROM BAG (~~LOC~~)
30.0000 mg | INTRAVENOUS | Status: DC | PRN
Start: 2016-07-11 — End: 2016-07-12

## 2016-07-11 MED ORDER — HEPARIN PER PHARMACY - ~~LOC~~
Status: DC
Start: 2016-07-11 — End: 2016-07-25

## 2016-07-11 MED ORDER — PROPOFOL 1000 MG/100ML IV EMUL
INTRAVENOUS | Status: DC
Start: 2016-07-11 — End: 2016-07-24
  Filled 2016-07-11: qty 100

## 2016-07-11 MED ORDER — POTASSIUM CHLORIDE 20 MEQ/15ML (10%) OR SOLN
80.0000 meq | Freq: Once | ORAL | Status: DC
Start: 2016-07-11 — End: 2016-07-11

## 2016-07-11 MED ORDER — ACETAMINOPHEN 160 MG/5ML OR SOLN
650.0000 mg | ORAL | Status: DC | PRN
Start: 2016-07-11 — End: 2016-07-14
  Administered 2016-07-11 – 2016-07-13 (×10): 650 mg via NASOGASTRIC
  Filled 2016-07-11 (×9): qty 20.3

## 2016-07-11 MED ORDER — MAGNESIUM SULFATE 4 GM/50ML IV SOLN
4.00 g | Freq: Once | INTRAVENOUS | Status: AC
Start: 2016-07-11 — End: 2016-07-11
  Administered 2016-07-11 (×2): 4 g via INTRAVENOUS
  Filled 2016-07-11: qty 50

## 2016-07-11 MED ORDER — ENOXAPARIN SODIUM 40 MG/0.4ML SC SOLN
40.0000 mg | Freq: Every day | SUBCUTANEOUS | Status: DC
Start: 2016-07-11 — End: 2016-07-11
  Administered 2016-07-11 (×2): 40 mg via SUBCUTANEOUS
  Filled 2016-07-11: qty 1

## 2016-07-11 MED ORDER — DEXTROSE-KCL-NACL 5-0.15-0.9 % IV SOLN
INTRAVENOUS | Status: DC
Start: 2016-07-11 — End: 2016-07-12
  Administered 2016-07-12: 01:00:00 via INTRAVENOUS
  Filled 2016-07-11 (×2): qty 1000

## 2016-07-11 MED ORDER — HEPARIN 25,000 UNITS/250 ML 1/2 NS PREMIX INFUSION (~~LOC~~)
1950.0000 [IU]/h | INTRAMUSCULAR | Status: DC
Start: 2016-07-11 — End: 2016-07-15
  Administered 2016-07-11 – 2016-07-12 (×6): 1500 [IU]/h via INTRAVENOUS
  Administered 2016-07-12: 1650 [IU]/h via INTRAVENOUS
  Administered 2016-07-12: 1500 [IU]/h via INTRAVENOUS
  Administered 2016-07-12: 1650 [IU]/h via INTRAVENOUS
  Administered 2016-07-12 (×2): 1500 [IU]/h via INTRAVENOUS
  Administered 2016-07-13 (×2): 1800 [IU]/h via INTRAVENOUS
  Administered 2016-07-13 (×5): 1650 [IU]/h via INTRAVENOUS
  Administered 2016-07-13: 1950 [IU]/h via INTRAVENOUS
  Administered 2016-07-13 (×2): 1800 [IU]/h via INTRAVENOUS
  Administered 2016-07-13 (×2): 1650 [IU]/h via INTRAVENOUS
  Administered 2016-07-13 (×3): 1950 [IU]/h via INTRAVENOUS
  Administered 2016-07-13: 1650 [IU]/h via INTRAVENOUS
  Administered 2016-07-13: 1950 [IU]/h via INTRAVENOUS
  Administered 2016-07-13: 1650 [IU]/h via INTRAVENOUS
  Administered 2016-07-13: 1800 [IU]/h via INTRAVENOUS
  Administered 2016-07-14 – 2016-07-15 (×25): 1950 [IU]/h via INTRAVENOUS
  Filled 2016-07-11 (×9): qty 250

## 2016-07-11 MED ORDER — QUETIAPINE FUMARATE 50 MG OR TABS
100.0000 mg | ORAL_TABLET | Freq: Three times a day (TID) | ORAL | Status: DC
Start: 2016-07-11 — End: 2016-07-12
  Administered 2016-07-11 – 2016-07-12 (×4): 100 mg via ORAL
  Filled 2016-07-11 (×3): qty 2

## 2016-07-11 MED ORDER — POTASSIUM CHLORIDE 20 MEQ/15ML (10%) OR SOLN
40.00 meq | ORAL | Status: AC
Start: 2016-07-11 — End: 2016-07-11
  Administered 2016-07-11 (×3): 40 meq via NASOGASTRIC
  Filled 2016-07-11 (×2): qty 30

## 2016-07-11 MED ORDER — POTASSIUM ACETATE 2 MEQ/ML IV SOLN
40.00 meq | Freq: Once | INTRAVENOUS | Status: AC
Start: 2016-07-11 — End: 2016-07-11
  Administered 2016-07-11 (×2): 40 meq via INTRAVENOUS
  Filled 2016-07-11: qty 20

## 2016-07-11 MED ORDER — MAGNESIUM SULFATE 50 % IJ SOLN
4000.0000 mg | Freq: Once | INTRAMUSCULAR | Status: DC
Start: 2016-07-11 — End: 2016-07-11

## 2016-07-11 NOTE — Interdisciplinary (Signed)
SLP Contact       07/11/16 0835    Therapy Contact  Note    Therapy not provided at this time as Nursing has deferred therapy at this time.    Additional Comments Order received for dysphagia evaluation, acknowledged and attempted.  RN deferred assessment at this time secondary to agitation requiring sedation.  SLP will plan to re-attempt 07/12/16

## 2016-07-11 NOTE — Plan of Care (Signed)
Problem: Promotion of health and safety  Goal: Promotion of Health and Safety  The patient remains safe, receives appropriate treatment and achieves optimal outcomes (physically, psychosocially, and spiritually) within the limitations of the disease process by discharge.   Outcome: Goal Met

## 2016-07-11 NOTE — Progress Notes (Signed)
Progress Note  Surgical Intensive Care Unit    Patient Name: Jerome Hunt  MRN: [id number]     Admitting Service: [place] Attending Provider: Rae Halsted    Mechanism of Injury: <principal problem not specified>    Date: 07/11/16  Hospital Day:   5 days - Admitted on: 07/06/2016    Injuries/Problems: Problem List:  2017-12: Peripheral vascular disease (CMS-HCC)  2017-11: Peripheral artery disease (CMS-HCC)      OVERNIGHT: extubated on precedex. Hg 7.0 received 1 uPRBCs    OBJECTIVE:   Physical Exam:  intermiten tachy  No resp distress  abd soft ND  L groin pico in place; incision on LLE in tact; foot warm; cannot palpate pulse over graft; doppler AT  R foot with doppler DP        Vitals signs:     Latest Entry  Range (last 24 hours)    Temperature: 102.9 F (39.4 C)  Temp  Avg: 100.1 F (37.8 C)  Min: 99 F (37.2 C)  Max: 102.9 F (39.4 C)    Blood pressure (BP): 106/58  BP  Min: 106/58  Max: 154/84    Heart Rate: 104  Pulse  Avg: 116.3  Min: 100  Max: 162    Respirations: (!) 47  Resp  Avg: 26.7  Min: 17  Max: 48    SpO2: 100 %  SpO2  Avg: 99.3 %  Min: 96 %  Max: 100 %       No Data Recorded       No Data Recorded       No Data Recorded     Weight: 101.9 kg (224 lb 10.4 oz)  Percentage Weight Change (%): 2.41 %    I&O:  Intake/Output       07/10/16 0600 - 07/11/16 0559 07/11/16 0600 - 07/12/16 0559      6283-1517 1800-0559 Total 0600-1759 6160-7371 Total       Intake    I.V.  266.5  -- 266.5  --  -- --    Drain (Flush)  50  -- 50  --  -- --    Total Intake 316.5 -- 316.5 -- -- --       Output    Urine  4895  910 5805  150  -- 150    Drains  90  -- 90  30  -- 30    Total Output 4985 910 5895 180 -- 180       Net I/O     -4668.5 -910 -5578.5 -180 -- -180            Labs:  CBC  Recent Labs      07/10/16   0050   07/10/16   1123  07/10/16   1601  07/11/16   0547   WBCCOUNT  15.1*   < >  17.1*  17.6*  13.3*   HGB  7.0*   < >  9.3*  9.3*  8.3*   PLCTEL  143*   < >  153  169  174    NEUTP  74.5   --    --    --   68.9   LYMPP2   --    < >  10.5  16.2   --    MONP2   --    < >  7.6  6.7   --     < > = values in this interval not displayed.  Chemistry  Recent Labs      07/09/16   0344  07/10/16   0340  07/11/16   0547   SODIUM  140  140  139   K  3.7  3.4*  2.9*   CL  108*  109*  103   CO2  27  25  24    BUN  10  8  10    CREAT  0.8  0.8  1.0   GLU  122*  112  87   [place]  7.8*  7.9*  8.3*   MG  2.1   --   1.4*   PHOS  3.3   --   2.0*     Recent Labs      07/11/16   0547   ALK  48   AST  58*   ALT  20   TBILI  1.0   DBILI  0.3*   ALB  3.3*       Coags  Recent Labs      07/10/16   1123  07/10/16   1317   PTT  28.1  42.0*       Medications:  Scheduled Meds   aspirin  81 mg Daily    ceFAZolin (ANCEF) IV  2,000 mg Q8H    chlordiazePOXIDE  75 mg Q8H    potassium chloride-thiamine-multivitamin-folic acid infusion builder   Q24H    docusate  250 mg Daily    famotidine  20 mg Q12H    influenza vaccine >=3 yrs  0.5 mL Prior to discharge    lidocaine   Once    multivitamin adult  1 tablet Daily    oxyCODONE  5 mg Q4H    potassium acetate IVPB  40 mEq Once    potassium chloride  80 mEq Once    povidone-iodine  1 Application BID    QUEtiapine  50 mg Q8H     PRN Meds   HYDROmorphone  0.5 mg Q2H PRN    LORazepam  1.5 mg Q30 Min PRN    Or    LORazepam  3 mg Q30 Min PRN    ondansetron  4 mg Q6H PRN     IV Meds   dexmedeTOMidine (PRECEDEX) 443mg/100mL infusion 2 mcg/kg/hr (07/11/16 0600)    dextrose-sodium chloride 5%-0.9%-potassium chloride 20 mEq         Allergies:  Allergies   Allergen Reactions    No Known Allergies [Other] Other     Received name: No Known Allergies       OVERNIGHT: Patient still very agitated. Troponin 0.5 to 0.3. No EKG changes. Febrile. No CT    ASSESSMENT / PLAN / RECOMMENDATIONS:  89M with a traumatic left lower extremity injury 3 years prior to presents with pain to left lower extremity concerning for claudication  S/p left popliteal to distal PT bypass   S/p Bypass revision, left popliteal to peroneal and peroneal to PT jump graft    07/06/16  LEFT POPLITEAL TO DISTAL POSTERIOR TIBIAL ARTERY BYPASS LEFT SAPHENOUS VEIN HARVEST AORTOGRAM LEFT LEG ANGIOGRAM LEFT POPLITEAL ARTERY ANGIOPLASTY (4X4, 5X4) LEFT DISTAL POSTERIOR TIBIAL ARTERY ANGIOPLASTY       07/07/16  Redo Left transinterosseous below-knee popliteal-peroneal and sequential peroneal-distal posterior tibal-medial tarsal reverse saphenous vein bypass; Left distal segmental fibula resection to expose distal peroneal artery,  Thrombectomy of left below-knee popliteal-posterior tibial reverse saphenous vein bypass    Neuro:  GCS:  GCS 14 4/4/6   Mental status: A&Ox1. agitation off  sedation  - on precedex 56mg/kg/hr  - wean off sedation as pt tolerates  - CIWA for h/o etoh Abuse and withdrawal ativan scale increased  -librium 75 q8hrs, increase oxy 539mto 1016m4hrs, increase seroquel 22m60m 100mg61mrs  - pain 6/10  -aspirin, gabapentin  -pt unable to use dilaudid pca, dilaudid pushes  -Head CT    CV:BP  Min: 106/58  Max: 154/84  Pulse  Avg: 116.3  Min: 100  Max: 162  120s-140s/50s-60s  HR 100s-120s  -EKG no changes. Troponin 0.05->0.03    #LLE claudication s/p bypass   - bypass likely thrombosed again  - arterial duplex 07/08/16: There is distal arterial flow noted in the posterior tibial artery. The bypass graft is not visualized.Mild stenosis at the left mid-distal SFA.  - ABI with AT on LLE is 0.63 07/08/16  - q1 neurovscular checks  - pulse ox monitor on LLE (currently at 100%)   -DVT Bilt LE US   [place]:  On 2L NC sating 100%. Wean off as tolerated  -CXR 12/6 improved aeration throughout                FEN/GI:  Abd exam: soft  D5NS @75cc /hr, TKO  Keo feed placed, start tube feeds  Diet:NPO Reason: Surgery/Procedure; Tube Feed: Hold while NPO (if ordered)  Tube Feed Adult Impact Peptide 1.5 Cal; Adult Tube Feed Route: Nasogastric Feeding Tube; Water Flush Volume: 25 mL; Water Flush Frequency: 4 hr;  Continuous or Intermittent: Continuous; Initial rate (mL/hour): 20; Rate Increase (mL/hour): 20; Rate ...   Colace  pepcid  K2.9, repleted, phos 2.0, mg 1.4    Renal:   Intake/Output Summary (Last 24 hours) at 07/11/16 0716  Last data filed at 07/11/16 0600   Gross per 24 hour   Intake              284 ml   Output             5830 ml   Net            -5546 ml     UOP: adequate at 5.9L -> 245cc/hr  Drain 120  Cr 1.0  Condom cath    Hematology:   hb 10.6 -> 8.9 ->8.0 -> 7.0 -> 9.3 ->8.3  Hg 7.0 received 1 uPRBCs 12/5  -no DVT ppx on board    ID: Temp  Avg: 100.1 F (37.8 C)  Min: 99 F (37.2 C)  Max: 102.9 F (39.4 C)  febrile WBC 17.6 -> 13.3  Micro: BCx 12/1 NGTD, BC 12/5 pending  -UA 12/5 negative   -f/u STD labs  Abx: ancef 2g q8hrs for wound per vascular    Endocrine:  FSBS: < 180    MSK:  PT/OT:  No  Weight Bearing Status:  weight bear as tolerated  -ABI 0.6      ICU Care:    Feedings: yes  Analgesia: Yes  Sedation: Yes  Thromboprophylaxis:SCDs, early mobility protocol and subcutaneous heparin  Head-of-bed elevation: Yes  Ulcer prophylaxis: no  Glycemic control: no  GI Bowel care: Yes  Indwelling catheter removal: no  ID: de-escalation of Abx: yes  Spontaneous breathing trial: N/A    Family discussion: yes    Recommendations:  Increase seroquel from 50 to 100q8hrs  Increase oxy from 5 to 10mg 92ms  Start feeds    DISPO: SICU    SICU Attending Attestation:    07/11/16 11:14 AM    Remained in SICU overnight  secondary to above diagnoses and due to concern for possible sudden deterioration. Patient met criteria for critical illness and ICU level of care during my evaluation and treatment due to the following diagnoses and/or management issues:    1. Altered mental status - agitation remains an issue. Still on precedex, seroquel, CIWA. Increasing seroquel today. Head CT still pending  2. Mild hypoxia present. Continue to monitor SpO2 level and titrate  supplemental oxygen to maintain SPO2 = 93% -100% as tolerated. Lasix today as CXR shows increasing opacification bilaterally  3. Acute blood loss anemia - continue to closely monitor hemoglobin level.   4. Electrolyte imbalance including hypokalemia, hypomagnesemia  5. Fever - Cx neg thus far. WBC downtrending. Doubt infectious etiology.    Critical Care Time = 31 minutes today, excluding teaching and procedures.    Charyl Bigger Jaimes Eckert MD  [facility] Clinical Professor  Department of Surgery

## 2016-07-11 NOTE — Progress Notes (Signed)
During PM rounds patient noted to have cool pulse without palpable pulses.  Pt had restraint on right ankle. This was removed and we attempted to assess pulses which were not palpable nor could we initially appreciate a doppler signal on the PT or DP on the right. He had palpable femoral pulse on the right that was confirmed with doppler, no popliteal signal was present.      The foot of the bed was removed as it appeared the patient was pressing his foot against the foot plate of the bed and locking his knee in extension. We were initially planning to intubate the patient and take him down for a Stat CT angiogram as the patients agitation from alcohol withdrawal would be prohibitive.  During the time we were mobilizing resources to intubate and transport the patient to the CT scanner the anterior tibial signal became present on doppler. At this time we made the decision to hold the CT scan and perform a lower extremity doppler study as we believed that he was likely in vasospasm likely from the hyperextension of the knee or possibly compression from the restraint.      Flow to the ankle was noted however, there was no pulsatile arterial flow in the dorsalis pedis or posterior tibial vessels in the foot.     Hypercoagulable panel sent and patient will be started on a herpain gtt.     Dr. Verne Grainonayre was present at the bedside during this time.

## 2016-07-11 NOTE — Interdisciplinary (Signed)
NUTRITION NOTE    Patient Name:  Jerome Hunt  MRN: [id number]  Date of Birth: [birth year]  Age: 40 year old  Date of Admission:  07/06/2016    Service Date: July 11, 2016  Evaluation Type: Progress  Source of Referral: Consult       Subjective: Per RN he is having altered mental status. SLP attempted SLP evaluation earlier but could not completed. PT maybe going thru DT's. Plan head CT. Now cussing at nursing staff. RN just started TF @ 20 ml/hr. Pt with smear BM now.     Jerome Hunt is a 40 year old male, admitted on [id number] with a diagnosis of has Peripheral artery disease (CMS-HCC) and Peripheral vascular disease (CMS-HCC) on his problem list..     Active Issues and Plan:  07/06/16  LEFT POPLITEAL TO DISTAL POSTERIOR TIBIAL ARTERY BYPASS LEFT SAPHENOUS VEIN HARVEST AORTOGRAM LEFT LEG ANGIOGRAM LEFT POPLITEAL ARTERY ANGIOPLASTY (4X4, 5X4) LEFT DISTAL POSTERIOR TIBIAL ARTERY ANGIOPLASTY       07/07/16  Redo Left transinterosseous below-knee popliteal-peroneal and sequential peroneal-distal posterior tibal-medial tarsal reverse saphenous vein bypass; Left distal segmental fibula resection to expose distal peroneal artery,  Thrombectomy of left below-knee popliteal-posterior tibial reverse saphenous vein bypass    Neuro:  Mental status: A&Ox1. agitation off sedation  CIWA for h/o etoh Abuse and withdrawal ativan scale increased  -Head CT  #LLE claudication s/p bypass   FEN/GI:  Abd exam: soft  D5NS _0 /hr  Keo feed placed, started tube feeds  Diet:NPO Reason: Surgery/Procedure; Tube Feed: Hold while NPO (if ordered)  Tube Feed Adult Impact Peptide 1.5 Cal; Adult Tube Feed Route: Nasogastric Feeding Tube; Water Flush Volume: 25 mL; Water Flush Frequency: 4 hr; Continuous or Intermittent: Continuous; Initial rate (mL/hour): 20; Rate Increase (mL/hour): 20; Rate ...   Colace  pepcid  K2.9, repleted, phos 2.0, mg 1.4  LFTs, ammonia- bypass likely thrombosed again      Assessment:  Nutrition Related Findings   Nutrition Subjective : Spoke with nursing. Patient appears to be withdrawing from etoh  Physical Findings: Extubated  Current Diet order: Impact Peptide 1.5 FS 60 ml/hr x 24 hrs Flush 25 ml free water q 4 hrs  Current Nutrition Regimen Provides: =1440 ml, 1728 kcals/135 gms protein/1259 ml free water  Lab Review: Glucose 87 mg/dl, potassium 2.9, phos 2.0, Ammonia 56, Mag 1.4  Medication Review: Vitamins/Minerals;Bowel Medications;Antiemetic (IVF with kcl, thiamine, folic acid, magso4, mvi, antibiotics)  I/O:   Intake/Output Summary (Last 24 hours) at 07/11/16 1409  Last data filed at 07/11/16 1245   Gross per 24 hour   Intake              293 ml   Output             2315 ml   Net            -2022 ml     Febrile past 24 hrs. Tube feeding just started. Goal rate needs to be increased to 70 ml/hr x 24 hrs since propofol has been discontinued. No nutrition labs to evaluate. Multiple lytes are low and need correction. Pt lost 7.2 kg over a 3 day period. Need to monitor trend. Its unclear if admit wt was actual.     Lab Results   Component Value Date    SODIUM 139 07/11/2016    K 2.9 (L) 07/11/2016    CL 103 07/11/2016    CO2 24 07/11/2016    BUN 10 07/11/2016  CREAT 1.0 07/11/2016    [place] 8.3 (L) 07/11/2016     No results found for: A1C  No results found for: PREALB  No results found for: CRP  No results found for: VITD25HYDROX, VD2, VD3, VDT  Lab Results   Component Value Date    CHOL 237 05/05/2016    LDLCALC 152 05/05/2016    TRIG 139 05/05/2016       Allergies: No known allergies [other]    Anthropometrics:  Ht Readings from Last 1 Encounters:   07/06/16 _0  (1.905 m)        Wt Readings from Last 1 Encounters:   07/09/16 101.9 kg (224 lb 10.4 oz)      Body mass index is 28.08 kg/(m^2).    Nutrition Diagnosis   Nutrition Diagnosis 1: Increased Nutrient Needs  Related To: Increased nutrient needs due to catabolic illness  As Evidenced By: Conditions associated with medical Dx/Tx  Status: Ongoing   Nutrition Diagnosis 2: Inadequate Protein - Energy Intake  Related To: Inadequate tube feed order/rate  As Evidenced By: Documented nutrition intake  Status: Ongoing    Estimated Needs  Weight Used for Total Estimated Needs (kg): 99.5 kg  Min Total Caloric Estimated Needs (kcals/day): 2487.5  Max Total Caloric Estimated Needs (kcals/day): 2985  Min Total Protein Estimated Needs (g/day): 119.4  Max Total Protein Estimated Needs (g/day): 199  Max Total Fluid Estimated Needs (ml/day)  : 2985      Recommendations to Physician  Recommendations to Physicians:   1. Order Prealbumin and CRP  2. Adjust bowel care management   3. Check prealbumin/crp/vitamin D 25 hydroxy level  4. Adjust tube feeding goal rate to 70 ml/hr x 24hrs. See recommendations below.  5. Replete lytes until wnl range    Recommended Tube Feeding   Tube Feeding Formula Type: Impact Peptide 1.5 Cal  Formula (cal/ml): 1.5 cal/ml  Formula Protein (gms): 94  Feeding Type: continuous  Goal Rate (ml/hour): 70  Hours/day: 24  Amount (ml/day): 1680  Start Rate (ml/hour): 20  Rate Increase (ml) : 20  Rate Increase Frequency: 4hrs  Other Recommended Tube Feeding Regimen:  (off propofol now. )  Recommended Tube Feeding Total Kcals: 2520  Recommended Tube Feeding Total Protein (gms): 157.92  If no additional IVF provided, give 300 ml free water flushes q 6 hrs.    Monitoring and Evaluation  Nutrition Goals  Meet >75% of estimated needs: Progressing  Limit NPO < 5 days w/out support: No Longer Applicable  Nutritional needs met via alternative route: Progressing  Improve lab values: Progressing  Show improvement in skin integrity: Progressing    Follow up date: 07/16/16  Follow up Communication: HIgh Risk. Having DT's. Consult completed  Nutrition Risk Level: high    Name: Virgie Dad, RD     Date: 07/11/2016    Time: 2:09 PM

## 2016-07-11 NOTE — Progress Notes (Signed)
Progress Note    Patient Name: Jerome Hunt  MRN: [id number]  Room#: 6232/6232-01    Service: [place] Attending Provider: Wyn Forster, MD    Mechanism of Injury: LLE arterial insufficiency    Date: 07/11/16  Hospital Day:   5 days - Admitted on: 07/06/2016  Post Operative Day(s): 1 Day Post-Op    left lower extremity angioplasty, left popliteal-distal posterior tibial bypass with LLE GSV harvest 07/06/16  Revision left transinterossesous below-knee popliteal-peroneal and sequential peroneal-distal posterior tibial-medial tarsal reverse saphenous vein bypass  Left distal segmental fibula resection to expose distal peroneal artery, thrombectomy of left below-knee popliteal-posterior tibial reverse saphenous vein bypass 07/07/16    SUBJECTIVE: Jerome Hunt is a 40 year old ex smoker with a history of traumatic left lower extremity injury 3 years ago underwent left lower extremity angioplasty, left popliteal-distal posterior tibial bypass with LLE GSV harvest 07/06/16 by Dr Rae Halsted and re-do bypass by Dr Sherre Poot 07/07/16    Overnight events: Severely agitated when awake, likely secondary from alcohol withdrawal. PICO dressing and aline removed during agitation episode yesterday, PICO dressing replaced. KFT placed yesterday. Given 44m IV lasix yesterday, net -5L. Wife at bedside      OBJECTIVE:     GENERAL: NAD, severely agitated at times  HEENT: NC/AT EOMI MMM   NECK: supple  ABDOMEN: soft, non-distended  GROIN: no evidence of hematoma at right femoral access site, pico dressing in place left groin  EXTREMITY: LLE with dressing c/d/i,  dopplerable AT pulse  LLE drain in place, serosanguinous output  LLE warm to touch until dorsum of foot, palpable pulses RLE  LUE a line in place  Foley catheter in place, clear urine output      Lines, Drains and Airways:  Peripheral IV - 20 G Left Arm (Active)   Site Assessment Phlebitis 0 07/07/2016  4:00 AM   Line Lumen Status Infusing 07/07/2016  4:00 AM    Dressing Transparent 07/07/2016  4:00 AM   Dressing Status Clean, dry, intact 07/07/2016  4:00 AM       Peripheral IV - 18 G (Active)   Site Assessment Phlebitis 0 07/07/2016  4:00 AM   Line Lumen Status Infusing 07/07/2016  4:00 AM   Dressing Transparent 07/07/2016  4:00 AM   Dressing Status Clean, dry, intact 07/07/2016  4:00 AM       Arterial Line (ART) -  Left Radial (Active)   Site Assessment Phlebitis 0 07/07/2016  4:00 AM   Line Lumen Status Patent 07/07/2016  4:00 AM   Treatment Arm board on 07/07/2016  4:00 AM   Dressing Transparent 07/07/2016  4:00 AM   Dressing Status Clean, dry, intact 07/07/2016  4:00 AM       Vitals signs:     Latest Entry  Range (last 24 hours)    Temperature: 101.3 F (38.5 C)  Temp  Avg: 100.5 F (38.1 C)  Min: 99 F (37.2 C)  Max: 102.9 F (39.4 C)    Blood pressure (BP): 113/63  BP  Min: 106/58  Max: 154/84    Heart Rate: 97  Pulse  Avg: 115.7  Min: 97  Max: 162    Respirations: 20  Resp  Avg: 26.4  Min: 17  Max: 48    SpO2: 100 %  SpO2  Avg: 99.8 %  Min: 98 %  Max: 100 %       No Data Recorded       No Data Recorded  No Data Recorded     Weight: 101.9 kg (224 lb 10.4 oz)  Percentage Weight Change (%): 2.41 %    Labs:  CBC  Recent Labs      07/10/16   1601  07/11/16   0547   HGB  9.3*  8.3*   HCT  27.4*  23.7*        Chemistry  Recent Labs      07/09/16   0344   07/10/16   0340  07/10/16   1511  07/11/16   0547   K  3.7   --   3.4*   --   2.9*   CL  108*   --   109*   --   103   BICARB   --    < >  27.0  26.4   --    BUN  10   --   8   --   10   CREAT  0.8   --   0.8   --   1.0   GLU  122*   --   112   --   87   [place]  7.8*   --   7.9*   --   8.3*   MG  2.1   --    --    --   1.4*   PHOS  3.3   --    --    --   2.0*    < > = values in this interval not displayed.     Recent Labs      07/11/16   0547   ALK  48   AST  58*   ALT  20   TBILI  1.0   DBILI  0.3*   ALB  3.3*       Coags  Recent Labs      07/10/16   1123  07/10/16   1317   PTT  28.1  42.0*       Medications:  Scheduled Meds    aspirin  81 mg Daily    ceFAZolin (ANCEF) IV  2,000 mg Q8H    chlordiazePOXIDE  75 mg Q8H    potassium chloride-thiamine-multivitamin-folic acid infusion builder   Q24H    docusate  250 mg Daily    famotidine  20 mg Q12H    influenza vaccine >=3 yrs  0.5 mL Prior to discharge    lidocaine   Once    oxyCODONE  5 mg Q4H    potassium acetate IVPB  40 mEq Once    povidone-iodine  1 Application BID    QUEtiapine  50 mg Q8H     PRN Meds   HYDROmorphone  0.5 mg Q2H PRN    LORazepam  1.5 mg Q30 Min PRN    Or    LORazepam  3 mg Q30 Min PRN    ondansetron  4 mg Q6H PRN     IV Meds   dexmedeTOMidine (PRECEDEX) 430mg/100mL infusion 2 mcg/kg/hr (07/11/16 07619    dextrose-sodium chloride 5%-0.9%-potassium chloride 20 mEq         Allergies:  Allergies   Allergen Reactions    No Known Allergies [Other] Other     Received name: No Known Allergies       ASSESSMENT / PLAN / RECOMMENDATIONS:  Neuro:  Extubated, agitated when awak  Continue CIWA protocol, precedex  As per SICU  Will obtain CT head when able    CV:BP  Min: 106/58  Max: 154/84  Pulse  Avg: 115.7  Min: 97  Max: 162  Continue to monitor  q1h vascular checks     Pulmonary:Resp  Avg: 26.4  Min: 17  Max: 48  SpO2  Avg: 99.8 %  Min: 98 %  Max: 100 %    Non-labored respirations                FEN/GI:  KFT in place, start TF  GI PPX    Renal:   Intake/Output       07/10/16 0600 - 07/11/16 0559 07/11/16 0600 - 07/12/16 0559      5035-4656 1800-0559 Total 0600-1759 1800-0559 Total       Intake    I.V.  266.5  -- 266.5  250  -- 250    Drain (Flush)  50  -- 50  --  -- --    Total Intake 316.5 -- 316.5 250 -- 250       Output    Urine  4895  910 5805  480  -- 480    Drains  90  -- 90  30  -- 30    Total Output 4985 910 5895 510 -- 510       Net I/O     -4668.5 -910 -5578.5 -260 -- -260        ID: Temp  Avg: 100.5 F (38.1 C)  Min: 99 F (37.2 C)  Max: 102.9 F (39.4 C)  Received peri-op abx, ancef (12/5)  WBC 13.3 yesterday  Blood cx negative    Hematology:   H/H stable    Endocrine:  Monitor blood sugars    Prophylaxis:  SCD RLE   Heparin gtt  GI ppx  Bowel regimen  D/C Foley catheter when alert and awake    Dispo:  SICU      Continue with daily labs  Wbc starting to decrease  Decreased stool from flexiseal  Pain mgmt  Wean sedation as tolerated  Wean vent

## 2016-07-12 ENCOUNTER — Inpatient Hospital Stay: Payer: No Typology Code available for payment source

## 2016-07-12 ENCOUNTER — Inpatient Hospital Stay
Payer: No Typology Code available for payment source | Admitting: Student in an Organized Health Care Education/Training Program

## 2016-07-12 DIAGNOSIS — M25461 Effusion, right knee: Secondary | ICD-10-CM

## 2016-07-12 DIAGNOSIS — K567 Ileus, unspecified: Secondary | ICD-10-CM

## 2016-07-12 DIAGNOSIS — R4182 Altered mental status, unspecified: Secondary | ICD-10-CM

## 2016-07-12 DIAGNOSIS — K529 Noninfective gastroenteritis and colitis, unspecified: Secondary | ICD-10-CM

## 2016-07-12 LAB — URINALYSIS WITH CULTURE REFLEX, WHEN INDICATED
Bilirubin, UA: NEGATIVE
Glucose, UA: NEGATIVE MG/DL
Ketones, UA: 5 MG/DL — AB
Leukocyte Esterase, UA: NEGATIVE
Nitrite, UA: NEGATIVE
Protein, UA: 100 MG/DL — AB
RBC, UA: 25 #/HPF — ABNORMAL HIGH (ref 0–3)
Specific Grav, UA: 1.027 (ref 1.003–1.030)
UA Cult: NEGATIVE
Urobilinogen, UA: 2 MG/DL — ABNORMAL HIGH (ref <2.0)
WBC, UA: 2 #/HPF (ref 0–5)
pH, UA: 5 (ref 5.0–8.0)

## 2016-07-12 LAB — BASIC METABOLIC PANEL, BLOOD
BUN: 15 mg/dL (ref 7–25)
BUN: 19 mg/dL (ref 7–25)
BUN: 19 mg/dL (ref 7–25)
CO2: 21 mmol/L (ref 21–31)
CO2: 19 mmol/L — ABNORMAL LOW (ref 21–31)
CO2: 22 mmol/L (ref 21–31)
Calcium: 7.9 mg/dL — ABNORMAL LOW (ref 8.6–10.3)
Calcium: 8.3 mg/dL — ABNORMAL LOW (ref 8.6–10.3)
Calcium: 7.9 mg/dL — ABNORMAL LOW (ref 8.6–10.3)
Chloride: 107 mmol/L (ref 98–107)
Chloride: 108 mmol/L — ABNORMAL HIGH (ref 98–107)
Chloride: 109 mmol/L — ABNORMAL HIGH (ref 98–107)
Creat: 1.2 mg/dL (ref 0.7–1.3)
Creat: 1.5 mg/dL — ABNORMAL HIGH (ref 0.7–1.3)
Creat: 1.3 mg/dL (ref 0.7–1.3)
Electrolyte Balance: 7 mmol/L (ref 2–12)
Electrolyte Balance: 7 mmol/L (ref 2–12)
Electrolyte Balance: 5 mmol/L (ref 2–12)
Glucose: 206 mg/dL — ABNORMAL HIGH (ref 70–115)
Glucose: 184 mg/dL — ABNORMAL HIGH (ref 70–115)
Glucose: 150 mg/dL — ABNORMAL HIGH (ref 70–115)
Potassium: 3.4 mmol/L — ABNORMAL LOW (ref 3.5–5.1)
Potassium: INVALID mmol/L (ref 3.5–5.1)
Potassium: 3.6 mmol/L (ref 3.5–5.1)
Sodium: 135 mmol/L — ABNORMAL LOW (ref 136–145)
Sodium: 134 mmol/L — ABNORMAL LOW (ref 136–145)
Sodium: 136 mmol/L (ref 136–145)
eGFR - high estimate: >60 (ref >59)
eGFR - high estimate: >60 (ref >59)
eGFR - high estimate: >60 (ref >59)
eGFR - low estimate: >60 (ref >59)
eGFR - low estimate: 52 — ABNORMAL LOW (ref >59)
eGFR - low estimate: >60 (ref >59)

## 2016-07-12 LAB — ARTERIAL BLOOD GAS
Base Excess: NEGATIVE mmol/L
HCO3: 19.7 mmol/L — ABNORMAL LOW (ref 21.0–27.0)
Inspired O2: 0.4
O2 Sat: 99.1 % — ABNORMAL HIGH (ref 95.0–99.0)
Patient Temp: 39.8 DEG C
pCO2 (Temp Adjusted): 33.3 MMHG — ABNORMAL LOW (ref 36.0–42.0)
pH (Temp Adjusted): 7.4 (ref 7.38–7.42)
pO2 (Temp Adjusted): 162.9 MMHG — ABNORMAL HIGH (ref 80.0–104.0)

## 2016-07-12 LAB — LACTATE, BLOOD
Lactic Acid: 2.4 mmol/L — ABNORMAL HIGH (ref 0.5–2.2)
Lactic Acid: 0.8 mmol/L (ref 0.5–2.2)

## 2016-07-12 LAB — CBC WITH DIFF, BLOOD
Basophils %: 0 %
Basophils Absolute: 0 10*3/uL (ref 0.0–0.2)
Eosinophils %: 0 %
Eosinophils Absolute: 0 10*3/uL (ref 0.0–0.5)
Hematocrit: 23 % — ABNORMAL LOW (ref 39.5–50.0)
Hgb: 7.8 G/DL — ABNORMAL LOW (ref 13.5–16.9)
Lymphocytes %.: 14 %
Lymphocytes Absolute: 2.6 10*3/uL (ref 0.9–3.3)
MCH: 31.6 PG (ref 27.0–33.5)
MCHC: 33.9 G/DL (ref 32.0–35.5)
MCV: 93.1 FL (ref 81.5–97.0)
MPV: 10 FL (ref 7.2–11.7)
Monocytes %: 10.3 %
Monocytes Absolute: 1.9 10*3/uL — ABNORMAL HIGH (ref 0.0–0.8)
PLT Count: 166 10*3/uL (ref 150–400)
Platelet Morphology: NORMAL
RBC: 2.47 10*6/uL — ABNORMAL LOW (ref 3.70–5.00)
RDW-CV: 14.5 % — ABNORMAL HIGH (ref 11.6–14.4)
Seg Neutro % (M): 75.7 %
Seg Neutro Abs (M): 14.2 10*3/uL — ABNORMAL HIGH (ref 2.0–7.5)
White Bld Cell Count: 18.8 10*3/uL — ABNORMAL HIGH (ref 4.0–10.5)

## 2016-07-12 LAB — APTT, BLOOD
PTT: 54.1 s — ABNORMAL HIGH (ref 24.1–36.3)
PTT: 63.7 s — ABNORMAL HIGH (ref 24.1–36.3)
PTT: 95.2 s (ref 24.1–36.3)

## 2016-07-12 LAB — CPK-CREATINE PHOSPHOKINASE, BLOOD
CK: 4815 U/L — ABNORMAL HIGH (ref 30–223)
CK: 5210 U/L — ABNORMAL HIGH (ref 30–223)
CK: 5493 U/L — ABNORMAL HIGH (ref 30–223)

## 2016-07-12 LAB — COMPREHENSIVE METABOLIC PANEL, BLOOD
ALT: 30 U/L (ref 7–52)
AST: 86 U/L — ABNORMAL HIGH (ref 13–39)
Albumin: 2.9 G/DL — ABNORMAL LOW (ref 4.2–5.5)
Alk Phos: 61 U/L (ref 34–104)
BUN: 12 mg/dL (ref 7–25)
Bilirubin, Total: 0.7 mg/dL (ref 0.0–1.4)
CO2: 21 mmol/L (ref 21–31)
Calcium: 7.6 mg/dL — ABNORMAL LOW (ref 8.6–10.3)
Chloride: 110 mmol/L — ABNORMAL HIGH (ref 98–107)
Creat: 1.1 mg/dL (ref 0.7–1.3)
Electrolyte Balance: 5 mmol/L (ref 2–12)
Glucose: 146 mg/dL — ABNORMAL HIGH (ref 70–115)
Potassium: 3.9 mmol/L (ref 3.5–5.1)
Protein, Total: 5.8 G/DL — ABNORMAL LOW (ref 6.0–8.3)
Sodium: 136 mmol/L (ref 136–145)
eGFR - high estimate: >60 (ref >59)
eGFR - low estimate: >60 (ref >59)

## 2016-07-12 LAB — LIVER PANEL, BLOOD
ALT: 31 U/L (ref 7–52)
AST: INVALID U/L (ref 13–39)
Albumin: 3.7 G/DL — ABNORMAL LOW (ref 4.2–5.5)
Alk Phos: 98 U/L (ref 34–104)
Bilirubin, Direct: 0.1 mg/dL (ref 0.0–0.2)
Bilirubin, Total: INVALID mg/dL (ref 0.0–1.4)
Protein, Total: 6.5 G/DL (ref 6.0–8.3)

## 2016-07-12 LAB — BLOOD CULTURE
Culture Result: NO GROWTH
Culture Result: NO GROWTH

## 2016-07-12 LAB — MAGNESIUM, BLOOD
Magnesium: 1.9 mg/dL (ref 1.9–2.7)
Magnesium: INVALID mg/dL (ref 1.9–2.7)
Magnesium: 2 mg/dL (ref 1.9–2.7)

## 2016-07-12 LAB — PHOSPHORUS, BLOOD
Phosphorus: 1.1 MG/DL — ABNORMAL LOW (ref 2.5–5.0)
Phosphorus: 1.8 MG/DL — ABNORMAL LOW (ref 2.5–5.0)
Phosphorus: 1.6 MG/DL — ABNORMAL LOW (ref 2.5–5.0)

## 2016-07-12 LAB — PTT CRT VAL CALL

## 2016-07-12 MED ORDER — LORAZEPAM 2 MG/ML IJ SOLN
2.00 mg | Freq: Once | INTRAMUSCULAR | Status: AC
Start: 2016-07-12 — End: 2016-07-13

## 2016-07-12 MED ORDER — IOPAMIDOL 76 % IV SOLN
75.00 mL | Freq: Once | INTRAVENOUS | Status: AC
Start: 2016-07-12 — End: 2016-07-12
  Administered 2016-07-12 (×2): 50 mL via INTRAVENOUS

## 2016-07-12 MED ORDER — CHLORHEXIDINE GLUCONATE 0.12 % MT SOLN
30.0000 mL | Freq: Two times a day (BID) | OROMUCOSAL | Status: DC
Start: 2016-07-12 — End: 2016-07-21
  Administered 2016-07-12 – 2016-07-20 (×16): 30 mL via OROMUCOSAL
  Filled 2016-07-12 (×12): qty 30

## 2016-07-12 MED ORDER — IOPAMIDOL 76 % IV SOLN
100.00 mL | Freq: Once | INTRAVENOUS | Status: AC
Start: 2016-07-12 — End: 2016-07-12
  Administered 2016-07-12 (×2): 100 mL via INTRAVENOUS

## 2016-07-12 MED ORDER — POTASSIUM & SODIUM PHOSPHATES 280-160-250 MG OR PACK
2.0000 | PACK | ORAL | Status: DC
Start: 2016-07-12 — End: 2016-07-12

## 2016-07-12 MED ORDER — METRONIDAZOLE IN NACL 500-0.79 MG/100ML-% IV SOLN
500.0000 mg | Freq: Three times a day (TID) | INTRAVENOUS | Status: DC
Start: 2016-07-12 — End: 2016-07-25
  Administered 2016-07-12 – 2016-07-25 (×40): 500 mg via INTRAVENOUS
  Filled 2016-07-12 (×39): qty 100

## 2016-07-12 MED ORDER — LORAZEPAM 100 MG/100 ML NS PREMIX INFUSION (~~LOC~~)
0.0000 mg/h | INTRAVENOUS | Status: DC
Start: 2016-07-12 — End: 2016-07-12
  Filled 2016-07-12: qty 100

## 2016-07-12 MED ORDER — SODIUM CHLORIDE 0.9 % IV SOLN
Freq: Once | INTRAVENOUS | Status: AC
Start: 2016-07-12 — End: 2016-07-12
  Administered 2016-07-12: 15:00:00 via INTRAVENOUS

## 2016-07-12 MED ORDER — SODIUM CHLORIDE 0.9 % IV BOLUS (~~LOC~~)
1000.00 mL | INJECTION | Freq: Once | INTRAVENOUS | Status: AC
Start: 2016-07-12 — End: 2016-07-12
  Administered 2016-07-12 (×2): 1000 mL via INTRAVENOUS

## 2016-07-12 MED ORDER — K PHOS DI & MONO-SOD PHOS MONO 155-852-130 MG OR TABS
500.0000 mg | ORAL_TABLET | ORAL | Status: DC
Start: 2016-07-12 — End: 2016-07-12

## 2016-07-12 MED ORDER — POTASSIUM CHLORIDE 20 MEQ/15ML (10%) OR SOLN
40.00 meq | Freq: Once | ORAL | Status: AC
Start: 2016-07-12 — End: 2016-07-12
  Administered 2016-07-12 (×2): 40 meq via NASOGASTRIC
  Filled 2016-07-12: qty 30

## 2016-07-12 MED ORDER — LACTATED RINGERS IV SOLN
INTRAVENOUS | Status: DC
Start: 2016-07-12 — End: 2016-07-14
  Administered 2016-07-12 – 2016-07-13 (×7): via INTRAVENOUS

## 2016-07-12 MED ORDER — PROPOFOL 200 MG/20ML IV EMUL
40.0000 ug/kg/min | INTRAVENOUS | Status: DC
Start: 2016-07-12 — End: 2016-07-12

## 2016-07-12 MED ORDER — SODIUM CHLORIDE 0.9 % IV BOLUS (~~LOC~~)
500.00 mL | INJECTION | Freq: Once | INTRAVENOUS | Status: AC
Start: 2016-07-12 — End: 2016-07-12
  Administered 2016-07-12 (×2): 500 mL via INTRAVENOUS

## 2016-07-12 MED ORDER — LORAZEPAM DOSE FROM BAG (~~LOC~~)
1.0000 mg | INTRAVENOUS | Status: DC | PRN
Start: 2016-07-12 — End: 2016-07-19
  Administered 2016-07-13 – 2016-07-14 (×3): 1 mg via INTRAVENOUS

## 2016-07-12 MED ORDER — SODIUM CHLORIDE 0.9 % IJ SOLN (CUSTOM)
10.0000 mL | INTRAMUSCULAR | Status: DC | PRN
Start: 2016-07-12 — End: 2016-07-27

## 2016-07-12 MED ORDER — SODIUM CHLORIDE 0.9 % IV SOLN
40.0000 meq | Freq: Once | INTRAVENOUS | Status: DC
Start: 2016-07-12 — End: 2016-07-12
  Filled 2016-07-12: qty 9.1

## 2016-07-12 MED ORDER — LORAZEPAM 100 MG/100 ML NS PREMIX INFUSION (~~LOC~~)
0.0000 mg/h | INTRAVENOUS | Status: DC
Start: 2016-07-12 — End: 2016-07-19
  Administered 2016-07-12: 2 mg/h via INTRAVENOUS
  Administered 2016-07-12: 1 mg/h via INTRAVENOUS
  Administered 2016-07-12: 6 mg/h via INTRAVENOUS
  Administered 2016-07-12: 3 mg/h via INTRAVENOUS
  Administered 2016-07-12: 5 mg/h via INTRAVENOUS
  Administered 2016-07-12 (×2): 7 mg/h via INTRAVENOUS
  Administered 2016-07-12: 4 mg/h via INTRAVENOUS
  Administered 2016-07-13: 5 mg/h via INTRAVENOUS
  Administered 2016-07-13 (×2): 10 mg/h via INTRAVENOUS
  Administered 2016-07-13: 7 mg/h via INTRAVENOUS
  Administered 2016-07-13: 5 mg/h via INTRAVENOUS
  Administered 2016-07-13: 4 mg/h via INTRAVENOUS
  Administered 2016-07-13: 7 mg/h via INTRAVENOUS
  Administered 2016-07-13 (×2): 5 mg/h via INTRAVENOUS
  Administered 2016-07-13: 4 mg/h via INTRAVENOUS
  Administered 2016-07-13: 5 mg/h via INTRAVENOUS
  Administered 2016-07-13: 10 mg/h via INTRAVENOUS
  Administered 2016-07-13: 5 mg/h via INTRAVENOUS
  Administered 2016-07-13: 7 mg/h via INTRAVENOUS
  Administered 2016-07-13 (×2): 9 mg/h via INTRAVENOUS
  Administered 2016-07-13: 07:00:00 10 mg/h via INTRAVENOUS
  Administered 2016-07-13: 4 mg/h via INTRAVENOUS
  Administered 2016-07-13: 8 mg/h via INTRAVENOUS
  Administered 2016-07-13: 10 mg/h via INTRAVENOUS
  Administered 2016-07-13: 8 mg/h via INTRAVENOUS
  Administered 2016-07-13: 6 mg/h via INTRAVENOUS
  Administered 2016-07-13 (×2): 10 mg/h via INTRAVENOUS
  Administered 2016-07-13: 4 mg/h via INTRAVENOUS
  Administered 2016-07-14: 5 mg/h via INTRAVENOUS
  Administered 2016-07-14: 6 mg/h via INTRAVENOUS
  Administered 2016-07-14 (×3): 5 mg/h via INTRAVENOUS
  Administered 2016-07-14: 6 mg/h via INTRAVENOUS
  Administered 2016-07-14 (×5): 5 mg/h via INTRAVENOUS
  Administered 2016-07-14: 7 mg/h via INTRAVENOUS
  Administered 2016-07-14: 5 mg/h via INTRAVENOUS
  Administered 2016-07-14: 6 mg/h via INTRAVENOUS
  Administered 2016-07-14 (×4): 5 mg/h via INTRAVENOUS
  Administered 2016-07-14: 6 mg/h via INTRAVENOUS
  Administered 2016-07-14: 5 mg/h via INTRAVENOUS
  Administered 2016-07-14: 6 mg/h via INTRAVENOUS
  Administered 2016-07-15: 7 mg/h via INTRAVENOUS
  Administered 2016-07-15: 6 mg/h via INTRAVENOUS
  Administered 2016-07-15 (×3): 5 mg/h via INTRAVENOUS
  Administered 2016-07-15: 8 mg/h via INTRAVENOUS
  Administered 2016-07-15: 4 mg/h via INTRAVENOUS
  Administered 2016-07-15 (×5): 8 mg/h via INTRAVENOUS
  Administered 2016-07-16: 3 mg/h via INTRAVENOUS
  Administered 2016-07-16: 1 mg/h via INTRAVENOUS
  Administered 2016-07-16: 2 mg/h via INTRAVENOUS
  Filled 2016-07-12 (×2): qty 100
  Filled 2016-07-12: qty 10
  Filled 2016-07-12 (×4): qty 100

## 2016-07-12 MED ORDER — SODIUM CHLORIDE 0.9 % IJ SOLN (CUSTOM)
50.0000 mL | Freq: Once | INTRAMUSCULAR | Status: DC | PRN
Start: 2016-07-12 — End: 2016-07-21

## 2016-07-12 MED ORDER — PROPOFOL 10 MG/ML BOLUS FROM BAG (~~LOC~~)
30.0000 mg | INTRAVENOUS | Status: DC | PRN
Start: 2016-07-12 — End: 2016-07-19
  Administered 2016-07-18 (×2): 30 mg via INTRAVENOUS

## 2016-07-12 MED ORDER — QUETIAPINE FUMARATE 50 MG OR TABS
150.0000 mg | ORAL_TABLET | Freq: Three times a day (TID) | ORAL | Status: DC
Start: 2016-07-12 — End: 2016-07-20
  Administered 2016-07-12 – 2016-07-20 (×21): 150 mg via ORAL
  Filled 2016-07-12 (×4): qty 3
  Filled 2016-07-12: qty 1
  Filled 2016-07-12 (×18): qty 3

## 2016-07-12 MED ORDER — PROPOFOL 10 MG/ML BOLUS FROM BAG (~~LOC~~)
30.0000 mg | INTRAVENOUS | Status: DC | PRN
Start: 2016-07-12 — End: 2016-07-19
  Administered 2016-07-13 – 2016-07-19 (×5): 30 mg via INTRAVENOUS

## 2016-07-12 MED ORDER — LIDOCAINE HCL 1 % IJ SOLN
1.0000 mL | Freq: Once | INTRAMUSCULAR | Status: AC
Start: 2016-07-12 — End: 2016-07-12
  Administered 2016-07-12 (×2): 1 mL via INTRADERMAL

## 2016-07-12 MED ORDER — LORAZEPAM DOSE FROM BAG (~~LOC~~)
1.0000 mg | INTRAVENOUS | Status: DC | PRN
Start: 2016-07-12 — End: 2016-07-19

## 2016-07-12 MED ORDER — SODIUM CHLORIDE 0.9 % IJ SOLN (CUSTOM)
20.0000 mL | INTRAMUSCULAR | Status: DC | PRN
Start: 2016-07-12 — End: 2016-07-27

## 2016-07-12 MED ORDER — FENTANYL DOSE FROM BAG (~~LOC~~)
25.0000 ug | Status: DC | PRN
Start: 2016-07-12 — End: 2016-07-19
  Administered 2016-07-13 – 2016-07-17 (×6): 25 ug via INTRAVENOUS

## 2016-07-12 MED ORDER — FENTANYL DOSE FROM BAG (~~LOC~~)
25.0000 ug | Status: DC | PRN
Start: 2016-07-12 — End: 2016-07-19

## 2016-07-12 MED ORDER — PANTOPRAZOLE SODIUM 40 MG IV SOLR
40.0000 mg | Freq: Every day | INTRAVENOUS | Status: DC
Start: 2016-07-13 — End: 2016-07-21
  Administered 2016-07-13 – 2016-07-21 (×10): 40 mg via INTRAVENOUS
  Filled 2016-07-12 (×9): qty 40

## 2016-07-12 MED ORDER — ROCURONIUM BROMIDE 50 MG/5ML IV SOSY
PREFILLED_SYRINGE | INTRAVENOUS | Status: DC | PRN
Start: 2016-07-12 — End: 2017-01-03
  Administered 2016-07-12 (×2): 50 mg via INTRAVENOUS

## 2016-07-12 MED ORDER — PROPOFOL 1000 MG/100 ML INFUSION (~~LOC~~)
0.0000 ug/kg/min | INTRAVENOUS | Status: DC
Start: 2016-07-12 — End: 2016-07-19
  Administered 2016-07-12 (×2): 20 ug/kg/min via INTRAVENOUS
  Administered 2016-07-13: 30 ug/kg/min via INTRAVENOUS
  Administered 2016-07-13 (×3): 20 ug/kg/min via INTRAVENOUS
  Administered 2016-07-13: 10 ug/kg/min via INTRAVENOUS
  Administered 2016-07-13: 20 ug/kg/min via INTRAVENOUS
  Administered 2016-07-15 – 2016-07-16 (×5): 30 ug/kg/min via INTRAVENOUS
  Administered 2016-07-16: 29.931 ug/kg/min via INTRAVENOUS
  Administered 2016-07-16 (×3): 40 ug/kg/min via INTRAVENOUS
  Administered 2016-07-16: 29.931 ug/kg/min via INTRAVENOUS
  Administered 2016-07-16: 30 ug/kg/min via INTRAVENOUS
  Administered 2016-07-16: 29.931 ug/kg/min via INTRAVENOUS
  Administered 2016-07-16: 08:00:00 40 ug/kg/min via INTRAVENOUS
  Administered 2016-07-16: 30 ug/kg/min via INTRAVENOUS
  Administered 2016-07-16: 40 ug/kg/min via INTRAVENOUS
  Administered 2016-07-17 (×2): 20 ug/kg/min via INTRAVENOUS
  Administered 2016-07-17: 29.931 ug/kg/min via INTRAVENOUS
  Administered 2016-07-17: 20 ug/kg/min via INTRAVENOUS
  Administered 2016-07-17: 29.931 ug/kg/min via INTRAVENOUS
  Administered 2016-07-17: 19.954 ug/kg/min via INTRAVENOUS
  Administered 2016-07-17: 20 ug/kg/min via INTRAVENOUS
  Administered 2016-07-17: 30 ug/kg/min via INTRAVENOUS
  Administered 2016-07-17: 20 ug/kg/min via INTRAVENOUS
  Administered 2016-07-17: 30 ug/kg/min via INTRAVENOUS
  Administered 2016-07-17: 29.931 ug/kg/min via INTRAVENOUS
  Administered 2016-07-17: 30 ug/kg/min via INTRAVENOUS
  Administered 2016-07-17 – 2016-07-18 (×4): 20 ug/kg/min via INTRAVENOUS
  Administered 2016-07-18: 40 ug/kg/min via INTRAVENOUS
  Administered 2016-07-18: 20 ug/kg/min via INTRAVENOUS
  Administered 2016-07-18: 30 ug/kg/min via INTRAVENOUS
  Administered 2016-07-18: 50 ug/kg/min via INTRAVENOUS
  Administered 2016-07-18 (×10): 20 ug/kg/min via INTRAVENOUS
  Administered 2016-07-19: 50.049 ug/kg/min via INTRAVENOUS
  Administered 2016-07-19: 60 ug/kg/min via INTRAVENOUS
  Administered 2016-07-19 (×2): 50.049 ug/kg/min via INTRAVENOUS
  Administered 2016-07-19 (×2): 50 ug/kg/min via INTRAVENOUS
  Administered 2016-07-19 (×3): 50.049 ug/kg/min via INTRAVENOUS
  Filled 2016-07-12 (×17): qty 100

## 2016-07-12 MED ORDER — PROPOFOL 200 MG/20ML IV EMUL
INTRAVENOUS | Status: DC | PRN
Start: 2016-07-12 — End: 2017-01-03
  Administered 2016-07-12 (×2): 200 mg via INTRAVENOUS

## 2016-07-12 MED ORDER — K PHOS DI & MONO-SOD PHOS MONO 155-852-130 MG OR TABS
500.00 mg | ORAL_TABLET | ORAL | Status: AC
Start: 2016-07-12 — End: 2016-07-12
  Administered 2016-07-12 (×4): 2 via ORAL
  Filled 2016-07-12 (×3): qty 2

## 2016-07-12 MED ORDER — FENTANYL CITRATE (PF) 100 MCG/2ML IJ SOLN
50.00 ug | Freq: Once | INTRAMUSCULAR | Status: AC
Start: 2016-07-12 — End: 2016-07-12
  Administered 2016-07-12 (×2): 50 ug via INTRAVENOUS
  Filled 2016-07-12: qty 2

## 2016-07-12 MED ORDER — FENTANYL 50 MCG/ML PREMIX INFUSION (~~LOC~~)
0.0000 ug/h | Status: DC
Start: 2016-07-12 — End: 2016-07-19
  Administered 2016-07-12: 75 ug/h via INTRAVENOUS
  Administered 2016-07-12: 100 ug/h via INTRAVENOUS
  Administered 2016-07-12: 125 ug/h via INTRAVENOUS
  Administered 2016-07-12: 175 ug/h via INTRAVENOUS
  Administered 2016-07-12: 200 ug/h via INTRAVENOUS
  Administered 2016-07-12: 25 ug/h via INTRAVENOUS
  Administered 2016-07-12: 50 ug/h via INTRAVENOUS
  Administered 2016-07-12: 150 ug/h via INTRAVENOUS
  Administered 2016-07-12: 14:00:00 25 ug/h via INTRAVENOUS
  Administered 2016-07-12: 200 ug/h via INTRAVENOUS
  Administered 2016-07-13: 150 ug/h via INTRAVENOUS
  Administered 2016-07-13 (×2): 250 ug/h via INTRAVENOUS
  Administered 2016-07-13: 150 ug/h via INTRAVENOUS
  Administered 2016-07-13: 250 ug/h via INTRAVENOUS
  Administered 2016-07-13: 200 ug/h via INTRAVENOUS
  Administered 2016-07-13: 250 ug/h via INTRAVENOUS
  Administered 2016-07-13: 150 ug/h via INTRAVENOUS
  Administered 2016-07-13: 125 ug/h via INTRAVENOUS
  Administered 2016-07-13: 100 ug/h via INTRAVENOUS
  Administered 2016-07-13 (×3): 250 ug/h via INTRAVENOUS
  Administered 2016-07-13: 225 ug/h via INTRAVENOUS
  Administered 2016-07-13: 200 ug/h via INTRAVENOUS
  Administered 2016-07-13: 150 ug/h via INTRAVENOUS
  Administered 2016-07-13: 100 ug/h via INTRAVENOUS
  Administered 2016-07-13 (×2): 150 ug/h via INTRAVENOUS
  Administered 2016-07-13: 250 ug/h via INTRAVENOUS
  Administered 2016-07-13: 200 ug/h via INTRAVENOUS
  Administered 2016-07-14 (×6): 150 ug/h via INTRAVENOUS
  Administered 2016-07-14: 175 ug/h via INTRAVENOUS
  Administered 2016-07-14 (×14): 150 ug/h via INTRAVENOUS
  Administered 2016-07-15 (×6): 175 ug/h via INTRAVENOUS
  Administered 2016-07-16 – 2016-07-18 (×27): 150 ug/h via INTRAVENOUS
  Filled 2016-07-12 (×5): qty 100

## 2016-07-12 MED ORDER — SODIUM CHLORIDE 0.9 % IV SOLN
40.00 meq | Freq: Once | INTRAVENOUS | Status: AC
Start: 2016-07-12 — End: 2016-07-12
  Administered 2016-07-12 (×2): 40 meq via INTRAVENOUS
  Filled 2016-07-12: qty 9.09

## 2016-07-12 MED ORDER — VANCOMYCIN 50 MG/ML ORAL SOLN (COMPOUNDED) (~~LOC~~)
250.0000 mg | Freq: Four times a day (QID) | ORAL | Status: DC
Start: 2016-07-12 — End: 2016-07-14
  Administered 2016-07-12 – 2016-07-14 (×10): 250 mg via NASOGASTRIC
  Filled 2016-07-12 (×13): qty 5

## 2016-07-12 MED ORDER — SODIUM CHLORIDE 0.9 % IV SOLN
30.00 meq | Freq: Once | INTRAVENOUS | Status: AC
Start: 2016-07-12 — End: 2016-07-13
  Administered 2016-07-12 (×2): 30 meq via INTRAVENOUS
  Filled 2016-07-12: qty 6.82

## 2016-07-12 NOTE — Anesthesia Procedure Notes (Signed)
Intubation Procedure Note  Intubation Date & Time: 07/12/2016 1:25 PM   Universal Protocol: The procedure was performed in an emergent situation, relevant documents present and verified, test results available and properly labeled, imaging studies available and required blood products, implants, devices, and special equipment available  Patient identity confirmed by: anonymous protocol, patient vented/unresponsive   directparalyzed (RSI)propofolrocuroniumMac 37.5Cuffed1BVMnoyeschest rise, CO2 detector, equal bilateral breath sounds and chest [id number] holderordered, results pending   other (comment) and airway protection (AMS, severe sepsis and agitation)

## 2016-07-12 NOTE — Progress Notes (Signed)
Interval evaluation.    During afternoon rounds with attending physicians we noted [name] to be in more distress. He was hypotensive with blood pressure in the 90's as well as worsening tachycardia. On exam his abdomen was taught and more distended. A review of patient labs showed elevated WBC to 18 with worsening lactic acidosis and the patient had several loose bowel movements. Additionally the CK was elevated. He had bialteral AT signals in both extremities.  Given findings we were concerned the patient may have developed antibiotic associated colitis and had become septic from the infection.  Given his altered mental status from his alcohol withdrawal we felt the safest course of action would be to intubate and sedate the patient. We would then proceed with CT scan of the head, abdomen and lower extremity runoff as he has been unable to safely travel to the CT scanner for the past several days due to agitation.     The patient was intubated by the ICU team and started on empiric treatment for Cdiff colitis.  Kub showed a dilated colon.      Plan was discussed with ICU fellow and resident who communicated the plan to ICU attending Dr. Aram Beechamolich.  Vascular surgery attendings Dr. Duanne GuessKabutey and Dr. Lissa MerlinKuo were present and in agreement with he above plan.

## 2016-07-12 NOTE — Plan of Care (Signed)
Problem: Promotion of health and safety  Goal: Promotion of Health and Safety  The patient remains safe, receives appropriate treatment and achieves optimal outcomes (physically, psychosocially, and spiritually) within the limitations of the disease process by discharge.   Outcome: Progressing toward goal, anticipate improvement over: >48 hours   07/12/16 1848   Adult/Peds Plan of Care   Standard of Care/Policy Critical Care SOC;[facility];Restraints SOC;Falls reduction   Outcome Evaluation (rationale for progressing/not progessing Patient intubated today and CT Scans Head, Chest, ABD, and pelvis done. Blood culture and urine culture sent.   Patient Specific Goal, Promotion of Health & Safety Remains febrile. Antiobiotics started   Additional Individualized Interventions/Recommendations  continue doppler checks to BLE Q 1hr.     Nursing Shift Summary                Rounding for the past 12 hrs:   Physician Rounding   07/12/16 0930 My nursing colleague or I joined the physician during  his/her interaction/rounds with the patient at least once during my shift   07/12/16 1254 My nursing colleague or I joined the physician during  his/her interaction/rounds with the patient at least once during my shift       Shift Comments for the past 12 hrs:   Comments   07/12/16 0930 SICU team bedside rounds.   07/12/16 1254 Dr Verne Grainonayre and Dr Lissa MerlinKuo and Dr Tonna CornerWhealon at bedside. Aware ABD distended, Tubefeed off per MD. Labs sent.   07/12/16 1310 Airway called per Primary team and SICU team.    07/12/16 1340 chest xray and KUB.   07/12/16 1431 PICC nurse at bedside for PICC line placement to Rt Arm.   07/12/16 1540 vasc. & sicu team notified of bladder pressure   07/12/16 1555 Dr Julieta BelliniNaderi at bedside. Bladder pressure=17.   07/12/16 1722 Dr Gay FillerM Whealon updated on patient status. Patient transported down to CT.

## 2016-07-12 NOTE — Procedures (Signed)
Jerome QuarryBrandon Hunt is a 40 year old male patient.    ICD-10-CM ICD-9-CM   1. Peripheral vascular disease (CMS-HCC) I73.9 443.9     No past medical history on file.  Blood pressure 111/61, pulse 122, temperature 103.2 F (39.6 C), resp. rate 16, height 6\' 3"  (1.905 m), weight 101.9 kg (224 lb 10.4 oz), SpO2 100 %.    PICC Placement  Date/Time: 07/12/2016 2:00 PM  Performed by: Lindon RompBALDADO, Eura Mccauslin  Authorized by: Dalene CarrowKABUTEY, NII-KABU   Consent: Verbal consent not obtained.  Risks and benefits discussed: Patient is sedated and intubated, no family at bedside.  Relevant documents: relevant documents present and verified  Test results: test results available and properly labeled  Imaging studies: imaging studies available  Patient identity confirmed: arm band and hospital-assigned identification number  Time out: Immediately prior to procedure a "time out" was called to verify the correct patient, procedure, equipment, support staff and site/side marked as required (with Amanda PeaPedro Balba RN).  Indications: difficult access/frequent blood draws  Anesthesia: see MAR for details  Anesthetic total: 1 mL  Pre-placement Checklist  Hand hygiene by all in room/procedure room: yes  CHG applicator used: yes   30 second scrub and 2 minute dry: yes  Operator wore: sterile gloves, mask with eye shield, sterile gown and hat  Skin prep dry before 1st puncture: yesHead to toe sterile drape, mask, cap, sterile gown, sterile gloves: yes  Sterile field maintained during procedure: Sterile field maintained during procedure  Sterile technique maintained while applying dressing: Yes  CHG impregnated site patch placed: No and N/A  Dressing Date/Time: 07/13/2016 2:38 PM  Preparation: skin prepped with chlorhexidine and skin prepped with alcohol  Insertion side: right   Insertion site: basilic  Brand:Bard  Line type: PICC  Lot #: [id number]  Catheter tip location: SVC  Patient position: flat  Catheter type: double lumen  Ultrasound guidance: yes   Ultrasound guide placement: vessel located, needle entry, guide wire removed and ultrasound image entry  Indications for ultrasound: safety    Reason for insertion: new indication  Number of attempts: 1  Successful placement: yes  Attending was not presentPost-procedure: stat-lock applied and dressing applied  Complications: none  Follow-up: ECG confirmation strips located in patient chart and flush protocol by guideline    Patient tolerance: Patient tolerated the procedure well with no immediate complications  Comments: Both upper arms are swollen prior to procedure.          [address] Jun Balba, [place]  07/12/2016

## 2016-07-12 NOTE — Interdisciplinary (Signed)
SLP Contact       07/12/16 0827    Therapy Contact  Note    Contact Time 0828    Therapy not provided at this time as Nursing has deferred speech therapy at this time.    Additional Comments Attempted to see patient for swallow evaluation however RN deferred evaluation at this time as patient agitated and restless. ST to follow up later today as schedule permits.

## 2016-07-12 NOTE — Progress Notes (Cosign Needed)
Inpatient Heparin Drug Monitoring Note    Jerome QuarryBrandon Hunt is a 40 year old male on anticoagulation therapy for left lower extremity ischemia s/p left lower extremity angioplasty, left popliteal-distal posterior tibial bypass with LLE GSV harvest 07/06/16    aPTT Goal:   65 - 90 seconds    Dosing weight = 91 kg     Current Heparin Rate:     Dose (Units/hr) Heparin: 1700 Units/hr    Labs:    PTT (last 24 hours):  Recent Labs      07/12/16   0535   PTT  54.1*       Labs:  (Last 3 days):  Recent Labs      07/10/16   1601  07/11/16   0547  07/11/16   1645  07/12/16   0535   HGB  9.3*  8.3*   --   7.8*   HCT  27.4*  23.7*   --   23.0*   CREAT   --   1.0  1.0  1.2       Labs (Last values):  Albumin   Date Value Ref Range Status   07/11/2016 3.3 4.2 - 5.5 G/DL Final     AST   Date Value Ref Range Status   07/11/2016 58 13 - 39 U/L Final     ALT   Date Value Ref Range Status   07/11/2016 20 7 - 52 U/L Final     Bilirubin, Total   Date Value Ref Range Status   07/11/2016 1.0 0.0 - 1.4 mg/dL Final         Drug/Disease Interactions and Assessments:  - Bleeding Assessment:  H/H is stable. Patient does not exhibit any signs or symptoms of bleeding (spoke to RN ).     - Current Nutrition Assessment:   Recent changes in nutrition/diet? no  NPO Reason: Surgery/Procedure; Tube Feed: Hold while NPO (if ordered)  Tube Feed Adult Impact Peptide 1.5 Cal; Adult Tube Feed Route: Nasogastric Feeding Tube; Water Flush Volume: 25 mL; Water Flush Frequency: 4 hr; Continuous or Intermittent: Continuous; Initial rate (mL/hour): 20; Rate Increase (mL/hour): 20; Rate ...    Assessment/Plan:    - aPTT= 54.1 seconds is below therapeutic goal range on 1500 units/hour.   - Heparin maintenance infusion was increased to 1700 units/hour early this morning.  - Check aPTT every 6 hours, to be drawn at 13:00  - Continue to monitor for signs and symptoms of bleeding  - Patient has influenza IM ordered. If given use small needle and apply  pressure to injection site for 2 minutes.    Pharmacy will continue to monitor closely. For questions please call [phone]    Jerome SchatzKathleen Thai Ansel BongBuu Edsel Hunt, [facility]CHARMD

## 2016-07-12 NOTE — Progress Notes (Signed)
Progress Note  Surgical Intensive Care Unit    Patient Name: Jerome Hunt  MRN: [id number]     Admitting Service: [place] Attending Provider: Rae Halsted    Mechanism of Injury: <principal problem not specified>    Date: 07/12/16  Hospital Day:   6 days - Admitted on: 07/06/2016    Injuries/Problems: Problem List:  2017-12: Peripheral vascular disease (CMS-HCC)  2017-11: Peripheral artery disease (CMS-HCC)      OVERNIGHT: extubated on precedex. Hg 7.0 received 1 uPRBCs    OBJECTIVE:   Physical Exam:  intermiten tachy  No resp distress  abd soft ND  L groin pico in place; incision on LLE in tact; foot warm; cannot palpate pulse over graft; doppler AT  R foot with doppler DP        Vitals signs:     Latest Entry  Range (last 24 hours)    Temperature: 103.3 F (39.6 C)  Temp  Avg: 102.5 F (39.2 C)  Min: 99.1 F (37.3 C)  Max: 103.3 F (39.6 C)    Blood pressure (BP): 93/53  BP  Min: 82/47  Max: 135/78    Heart Rate: 100  Pulse  Avg: 112.5  Min: 96  Max: 126    Respirations: (!) 34  Resp  Avg: 29.2  Min: 20  Max: 36    SpO2: 100 %  SpO2  Avg: 97.1 %  Min: 92 %  Max: 100 %       No Data Recorded       No Data Recorded       No Data Recorded     Weight: 101.9 kg (224 lb 10.4 oz)  Percentage Weight Change (%): 2.41 %    I&O:  Intake/Output       07/11/16 0600 - 07/12/16 0559 07/12/16 0600 - 07/13/16 0559      8938-1017 1800-0559 Total 0600-1759 5102-5852 Total       Intake    I.V.  350  4020.2 4370.2  170.4  -- 170.4    NG/GT  390  560 950  120  -- 120    Total Intake 740 4580.2 5320.2 290.4 -- 290.4       Output    Urine  985  775 1760  150  -- 150    Drains  70  20 90  --  -- --    Total Output 1055 795 1850 150 -- 150       Net I/O     -315 3785.2 3470.2 140.4 -- 140.4            Labs:  CBC  Recent Labs      07/10/16   0050   07/10/16   1601  07/11/16   0547  07/12/16   0535   WBCCOUNT  15.1*   < >  17.6*  13.3*  18.8*   HGB  7.0*   < >  9.3*  8.3*  7.8*   PLCTEL  143*   < >  169  174  166    NEUTP  74.5   --    --   68.9   --    LYMPP2   --    < >  16.2   --   14.0   MONP2   --    < >  6.7   --   10.3    < > = values in this interval not displayed.  Chemistry  Recent Labs      07/11/16   1645  07/12/16   0535   SODIUM  137  135*   K  3.7  3.4*   CL  105  107   CO2  24  21   BUN  11  15   CREAT  1.0  1.2   GLU  146*  206*   [place]  8.4*  7.9*   MG  2.1  1.9   PHOS  1.2*  1.1*     Recent Labs      07/11/16   0547   ALK  48   AST  58*   ALT  20   TBILI  1.0   DBILI  0.3*   ALB  3.3*       Coags  Recent Labs      07/10/16   1317  07/11/16   2138  07/12/16   0535   PTT  42.0*   --   54.1*   INR   --   1.33*   --        Medications:  Scheduled Meds   aspirin  81 mg Daily    ceFAZolin (ANCEF) IV  2,000 mg Q8H    chlordiazePOXIDE  75 mg Q8H    potassium chloride-thiamine-multivitamin-folic acid infusion builder   Q24H    docusate  250 mg Daily    influenza vaccine >=3 yrs  0.5 mL Prior to discharge    oxyCODONE  10 mg Q4H    propofol       QUEtiapine  100 mg Q8H     PRN Meds   acetaminophen  650 mg Q4H PRN    HYDROmorphone  0.5 mg Q2H PRN    LORazepam  1.5 mg Q30 Min PRN    Or    LORazepam  3 mg Q30 Min PRN    ondansetron  4 mg Q6H PRN    propofol  30 mg Q3 Min PRN    propofol  30 mg Q2H PRN     IV Meds   dexmedeTOMidine (PRECEDEX) 482mg/100mL infusion 2 mcg/kg/hr (07/12/16 0816)    dextrose-sodium chloride 5%-0.9%-potassium chloride 20 mEq 10 mL/hr at 07/12/16 0059    heparin (porcine) 1,700 Units/hr (07/12/16 0647)    propofol Stopped (07/11/16 2030)       Allergies:  Allergies   Allergen Reactions    No Known Allergies [Other] Other     Received name: No Known Allergies     ASSESSMENT / PLAN / RECOMMENDATIONS:  38M with a traumatic left lower extremity injury 3 years prior to presents with pain to left lower extremity concerning for claudication  S/p left popliteal to distal PT bypass  S/p Bypass revision, left popliteal to peroneal and peroneal to PT jump graft    07/06/16   LEFT POPLITEAL TO DISTAL POSTERIOR TIBIAL ARTERY BYPASS LEFT SAPHENOUS VEIN HARVEST AORTOGRAM LEFT LEG ANGIOGRAM LEFT POPLITEAL ARTERY ANGIOPLASTY (4X4, 5X4) LEFT DISTAL POSTERIOR TIBIAL ARTERY ANGIOPLASTY       07/07/16  Redo Left transinterosseous below-knee popliteal-peroneal and sequential peroneal-distal posterior tibal-medial tarsal reverse saphenous vein bypass; Left distal segmental fibula resection to expose distal peroneal artery,  Thrombectomy of left below-knee popliteal-posterior tibial reverse saphenous vein bypass    Neuro:   GCS 8  Mental status: A&Ox1. agitation off sedation  - on propofol and fentanyl gtt  - CIWA for h/o etoh Abuse and withdrawal ativan scale increased  -librium 75 q8hrs, oxy 157mq4hrs,  seroquel 112m q8hrs  -aspirin, gabapentin  -pt unable to use dilaudid pca, dilaudid pushes  -Head CT    CV:BP  Min: 82/47  Max: 135/78  Pulse  Avg: 112.5  Min: 96  Max: 126  120s-140s/50s-60s  HR 100s-120s  -f/u CTA runoff  -received 500 CC bolus    #LLE claudication s/p bypass   - bypass likely thrombosed again  - arterial duplex 07/08/16: There is distal arterial flow noted in the posterior tibial artery. The bypass graft is not visualized.Mild stenosis at the left mid-distal SFA.  - ABI with AT on LLE is 0.63 07/08/16  - q1 neurovscular checks  - on heparin gtt  - pulse ox monitor on LLE (currently at 100%)   -DVT Bilt LE + UE UKoreanegative 07/12/16    Pulmonary:  Intubated AC/VC 12/500/5/40% satin 100%                FEN/GI:  KUB shows colonic illeus  TKO   tube feeds at goal  Diet:NPO Reason: Surgery/Procedure; Tube Feed: Hold while NPO (if ordered)  Tube Feed Adult Impact Peptide 1.5 Cal; Adult Tube Feed Route: Nasogastric Feeding Tube; Water Flush Volume: 25 mL; Water Flush Frequency: 4 hr; Continuous or Intermittent: Continuous; Initial rate (mL/hour): 20; Rate Increase (mL/hour): 20; Rate ...   Colace  pepcid  Replete electrolytes    Renal:      Intake/Output Summary (Last 24 hours) at 07/12/16 1724  Last data filed at 07/12/16 1700   Gross per 24 hour   Intake           6958.8 ml   Output             1980 ml   Net           4978.8 ml     UOP: adequate   Drain 60  Cr 1.5  Condom cath  -f/u lactate  -f/u Cr    Hematology:   hb 10.6 -> 8.9 ->8.0 -> 7.0 -> 9.3 ->8.3 ->7.8  Hg 7.0 received 1 uPRBCs 12/5  -no DVT ppx on board    ID: Temp  Avg: 102.5 F (39.2 C)  Min: 99.1 F (37.3 C)  Max: 103.3 F (39.6 C)  febrile WBC 17.6 -> 13.3 -> 18.8; with multiple foul loose stool; likely c. Diff colitis  Micro: BCx 12/1 NGTD, BC 12/5 pending  -UA 12/5 and 12/7 negative   -f/u STD labs  D/c'd ancef 2g q8hrs for wound per vascular  Started oral vanco, iv flagyl  -f/u c. Diff  -f/u BCx  F/u CT chest, CT abd/pelvis    Endocrine:  FSBS: < 180    MSK:  PT/OT:  No  Weight Bearing Status:  weight bear as tolerated  -ABI 0.6  -Xray shows right knee effusion  -f/u CPK 4000      ICU Care:    Feedings: yes  Analgesia: Yes  Sedation: Yes  Thromboprophylaxis:SCDs, early mobility protocol and subcutaneous heparin  Head-of-bed elevation: Yes  Ulcer prophylaxis: no  Glycemic control: no  GI Bowel care: Yes  Indwelling catheter removal: no  ID: de-escalation of Abx: yes  Spontaneous breathing trial: N/A    Family discussion: yes    DISPO: SICU    SICU Attending Attestation:    Remained in SICU overnight secondary to above diagnoses and due to concern for possible sudden deterioration. Patient met criteria for critical illness and ICU level of care during my evaluation and treatment due to the  following diagnoses and/or management issues:    1. AMS/Agitation - remains an issue, no significant change in mental status  2. Acute hypoxic/hypercarbic respiratory failure due to AMS, now intubated.  3. Diarrhea - now + for C-diff colitis. IV flagyl and PO vanco initiated. Ancef d/c'd. Follow daily AXR  4. Fever - likely due to C-diff       Charyl Bigger. Rajesh Wyss MD  [facility] Clinical Professor   Department of Surgery      Critical Care Time = 33 minutes today, excluding teaching and procedures.

## 2016-07-12 NOTE — Plan of Care (Signed)
Problem: Promotion of health and safety  Goal: Promotion of Health and Safety  The patient remains safe, receives appropriate treatment and achieves optimal outcomes (physically, psychosocially, and spiritually) within the limitations of the disease process by discharge.   Outcome: Progressing toward goal, anticipate improvement over: next 12-24 hours  Nursing Shift Summary    No data found.    No data found.      No data found.    Nursing Shift Summary    No data found.    No data found.      No data found.    Shift Comments for the past 12 hrs:   Comments   07/11/16 2100 Vascular team at bedside, notified unable to found pulse on right foot, MD prepared to intubate, but pulse eventually found, pt started on Heparing gtts, notified MD pt febrile, orders received for tylenol    07/11/16 2220 spoke with pharmacist and verified starting the heparin drip at 1500 units/hr   07/12/16 0600 Dr. Tonna CornerWhealon at bedside, pt assessed by MD, orders received.

## 2016-07-13 ENCOUNTER — Inpatient Hospital Stay: Payer: No Typology Code available for payment source

## 2016-07-13 DIAGNOSIS — R140 Abdominal distension (gaseous): Secondary | ICD-10-CM

## 2016-07-13 DIAGNOSIS — K6389 Other specified diseases of intestine: Secondary | ICD-10-CM

## 2016-07-13 DIAGNOSIS — Z452 Encounter for adjustment and management of vascular access device: Secondary | ICD-10-CM

## 2016-07-13 LAB — COMPREHENSIVE METABOLIC PANEL, BLOOD
ALT: 27 U/L (ref 7–52)
ALT: 24 U/L (ref 7–52)
AST: 74 U/L — ABNORMAL HIGH (ref 13–39)
AST: 51 U/L — ABNORMAL HIGH (ref 13–39)
Albumin: 2.7 G/DL — ABNORMAL LOW (ref 4.2–5.5)
Albumin: 2.6 G/DL — ABNORMAL LOW (ref 4.2–5.5)
Alk Phos: 56 U/L (ref 34–104)
Alk Phos: 60 U/L (ref 34–104)
BUN: 8 mg/dL (ref 7–25)
BUN: 5 mg/dL — ABNORMAL LOW (ref 7–25)
Bilirubin, Total: 0.7 mg/dL (ref 0.0–1.4)
Bilirubin, Total: 0.5 mg/dL (ref 0.0–1.4)
CO2: 20 mmol/L — ABNORMAL LOW (ref 21–31)
CO2: 25 mmol/L (ref 21–31)
Calcium: 7.2 mg/dL — ABNORMAL LOW (ref 8.6–10.3)
Calcium: 7.5 mg/dL — ABNORMAL LOW (ref 8.6–10.3)
Chloride: 112 mmol/L — ABNORMAL HIGH (ref 98–107)
Chloride: 110 mmol/L — ABNORMAL HIGH (ref 98–107)
Creat: 1 mg/dL (ref 0.7–1.3)
Creat: 0.9 mg/dL (ref 0.7–1.3)
Electrolyte Balance: 7 mmol/L (ref 2–12)
Electrolyte Balance: 4 mmol/L (ref 2–12)
Glucose: 142 mg/dL — ABNORMAL HIGH (ref 70–115)
Glucose: 159 mg/dL — ABNORMAL HIGH (ref 70–115)
Potassium: 3.9 mmol/L (ref 3.5–5.1)
Potassium: 4.1 mmol/L (ref 3.5–5.1)
Protein, Total: 5.2 G/DL — ABNORMAL LOW (ref 6.0–8.3)
Protein, Total: 5.1 G/DL — ABNORMAL LOW (ref 6.0–8.3)
Sodium: 139 mmol/L (ref 136–145)
Sodium: 139 mmol/L (ref 136–145)
eGFR - high estimate: >60 (ref >59)
eGFR - high estimate: >60 (ref >59)
eGFR - low estimate: >60 (ref >59)
eGFR - low estimate: >60 (ref >59)

## 2016-07-13 LAB — CPK-CREATINE PHOSPHOKINASE, BLOOD
CK: 4656 U/L — ABNORMAL HIGH (ref 30–223)
CK: 3757 U/L — ABNORMAL HIGH (ref 30–223)
CK: 2903 U/L — ABNORMAL HIGH (ref 30–223)

## 2016-07-13 LAB — C DIFF TOXIN, EIA: C. Diff Toxin, EIA: POSITIVE — AB

## 2016-07-13 LAB — CBC WITH DIFF, BLOOD
Bands % (M): 3.8 %
Bands Abs (M): 0.9 10*3/uL — ABNORMAL HIGH (ref 0.0–0.6)
Basophils %: 0 %
Basophils Absolute: 0 10*3/uL (ref 0.0–0.2)
Eosinophils %: 0 %
Eosinophils Absolute: 0 10*3/uL (ref 0.0–0.5)
Hematocrit: 23.4 % — ABNORMAL LOW (ref 39.5–50.0)
Hgb: 7.7 G/DL — ABNORMAL LOW (ref 13.5–16.9)
Lymphocytes %.: 12.5 %
Lymphocytes Absolute: 2.9 10*3/uL (ref 0.9–3.3)
MCH: 31.1 PG (ref 27.0–33.5)
MCHC: 33 G/DL (ref 32.0–35.5)
MCV: 94.2 FL (ref 81.5–97.0)
MPV: 10.3 FL (ref 7.2–11.7)
Metamyelocytes %: 4.8 %
Metamyelocytes Absolute: 1.1 10*3/uL — ABNORMAL HIGH
Monocytes %: 3.9 %
Monocytes Absolute: 0.9 10*3/uL — ABNORMAL HIGH (ref 0.0–0.8)
Myelocytes %: 1 %
Myelocytes Absolute: 0.2 10*3/uL — ABNORMAL HIGH
PLT Count: 192 10*3/uL (ref 150–400)
Platelet Morphology: NORMAL
RBC: 2.48 10*6/uL — ABNORMAL LOW (ref 3.70–5.00)
RDW-CV: 15.3 % — ABNORMAL HIGH (ref 11.6–14.4)
Seg Neutro % (M): 74 %
Seg Neutro Abs (M): 17.1 10*3/uL — ABNORMAL HIGH (ref 2.0–7.5)
White Bld Cell Count: 23.1 10*3/uL — ABNORMAL HIGH (ref 4.0–10.5)

## 2016-07-13 LAB — ARTERIAL BLOOD GAS
Base Excess: NEGATIVE mmol/L
HCO3: 20.1 mmol/L — ABNORMAL LOW (ref 21.0–27.0)
Inspired O2: 0.4
O2 Sat: 99 % (ref 95.0–99.0)
Patient Temp: 37.3 DEG C
pCO2 (Temp Adjusted): 35.6 MMHG — ABNORMAL LOW (ref 36.0–42.0)
pH (Temp Adjusted): 7.37 — ABNORMAL LOW (ref 7.38–7.42)
pO2 (Temp Adjusted): 143 MMHG — ABNORMAL HIGH (ref 80.0–104.0)

## 2016-07-13 LAB — MAGNESIUM, BLOOD
Magnesium: 1.7 mg/dL — ABNORMAL LOW (ref 1.9–2.7)
Magnesium: 2.4 mg/dL (ref 1.9–2.7)

## 2016-07-13 LAB — APTT, BLOOD
PTT: 73.4 s — ABNORMAL HIGH (ref 24.1–36.3)
PTT: 53.2 s — ABNORMAL HIGH (ref 24.1–36.3)
PTT: 60 s — ABNORMAL HIGH (ref 24.1–36.3)

## 2016-07-13 LAB — LACTATE, BLOOD
Lactic Acid: 0.8 mmol/L (ref 0.5–2.2)
Lactic Acid: 1.1 mmol/L (ref 0.5–2.2)

## 2016-07-13 LAB — CLOSTRIDIUM DIFFICILE PCR W/REFLEX, STOOL

## 2016-07-13 LAB — PHOSPHORUS, BLOOD
Phosphorus: 2.5 MG/DL (ref 2.5–5.0)
Phosphorus: 1.2 MG/DL — ABNORMAL LOW (ref 2.5–5.0)

## 2016-07-13 MED ORDER — MAGNESIUM SULFATE 4 GM/50ML IV SOLN
4.00 g | Freq: Once | INTRAVENOUS | Status: AC
Start: 2016-07-13 — End: 2016-07-13
  Administered 2016-07-13 (×2): 4 g via INTRAVENOUS
  Filled 2016-07-13: qty 50

## 2016-07-13 MED ORDER — METOPROLOL TARTRATE 5 MG/5ML IV SOLN
5.0000 mg | Freq: Four times a day (QID) | INTRAVENOUS | Status: DC | PRN
Start: 2016-07-13 — End: 2016-07-14
  Administered 2016-07-13 – 2016-07-14 (×3): 5 mg via INTRAVENOUS
  Filled 2016-07-13 (×2): qty 5

## 2016-07-13 MED ORDER — K PHOS DI & MONO-SOD PHOS MONO 155-852-130 MG OR TABS
750.00 mg | ORAL_TABLET | Freq: Once | ORAL | Status: AC
Start: 2016-07-14 — End: 2016-07-14
  Administered 2016-07-14 (×2): 3 via NASOGASTRIC
  Filled 2016-07-13: qty 3

## 2016-07-13 NOTE — Progress Notes (Signed)
Inpatient Heparin Drug Monitoring Note    Jerome Hunt is a 40 year old male on anticoagulation therapy for left lower extremity ischemia s/p left lower extremity angioplasty, left popliteal-distal posterior tibial bypass with LLE GSV harvest 07/06/16    aPTT Goal:   65 - 90 seconds    Dosing weight = 91 kg     Current Heparin Rate:     Dose (Units/hr) Heparin: 1650 Units/hr    Labs:    PTT (last 24 hours):  Recent Labs      07/13/16   0302   PTT  73.4*       Labs:  (Last 3 days):    Recent Labs      07/11/16   0547   07/12/16   0535   07/12/16   1458  07/12/16   1951  07/13/16   0302   HGB  8.3*   --   7.8*   --    --    --   7.7*   HCT  23.7*   --   23.0*   --    --    --   23.4*   CREAT  1.0   < >  1.2   < >  1.3  1.1  1.0    < > = values in this interval not displayed.       Labs (Last values):  Albumin   Date Value Ref Range Status   07/13/2016 2.7 4.2 - 5.5 G/DL Final     AST   Date Value Ref Range Status   07/13/2016 74 13 - 39 U/L Final     ALT   Date Value Ref Range Status   07/13/2016 27 7 - 52 U/L Final     Bilirubin, Total   Date Value Ref Range Status   07/13/2016 0.7 0.0 - 1.4 mg/dL Final         Drug/Disease Interactions and Assessments:  - Bleeding Assessment:  H/H is stable. Patient does not exhibit any signs or symptoms of bleeding (spoke to RN ).     - Current Nutrition Assessment:   Recent changes in nutrition/diet? no  NPO Reason: Surgery/Procedure; Tube Feed: Hold while NPO (if ordered)  Tube Feed Adult Impact Peptide 1.5 Cal; Adult Tube Feed Route: Nasogastric Feeding Tube; Water Flush Volume: 25 mL; Water Flush Frequency: 4 hr; Continuous or Intermittent: Continuous; Initial rate (mL/hour): 20; Rate Increase (mL/hour): 20; Rate ...    Assessment/Plan:    - aPTT= 73.4 seconds is within therapeutic goal range    - Will continue heparin maintenance infusion at 1650 units/hour.  - Check aPTT every 6 hours, to be drawn at 09:00  - Continue to monitor for signs and symptoms of bleeding   - Patient has influenza IM ordered. If given use small needle and apply pressure to injection site for 2 minutes.    Pharmacy will continue to monitor closely. For questions please call [phone]    Frutoso SchatzKathleen Thai Ansel BongBuu Darneshia Demary, [facility] Center[facility]ARMD

## 2016-07-13 NOTE — Plan of Care (Signed)
Problem: Promotion of health and safety  Goal: Promotion of Health and Safety  The patient remains safe, receives appropriate treatment and achieves optimal outcomes (physically, psychosocially, and spiritually) within the limitations of the disease process by discharge.   Outcome: Unable to meet goal at this time.   07/13/16 1919   Adult/Peds Plan of Care   Standard of Care/Policy Critical Care SOC;Skin SOC;Restraints SOC;Falls reduction   Outcome Evaluation (rationale for progressing/not progessing reamins tachy and febrile. BP stable. cdiff positive. able to wean down on sedation. following commands. DP pulses stable. stable on the vent, CK trending down.    Patient Specific Goal, Promotion of Health & Safety continue to let rest, titrate medications as tolerated. trend PTT. will attempt extubation tomorrow   Additional Individualized Interventions/Recommendations  monitor temp, treat as needed   Additional Individualized Interventions/Recommendations q1 neuro checks, q1 vascular checks   Nursing Shift Summary    No data found.    No data found.      No data found.    No data found.

## 2016-07-13 NOTE — Progress Notes (Signed)
Progress Note  Surgical Intensive Care Unit    Patient Name: Jerome Hunt  MRN: [id number]     Admitting Service: [place] Attending Provider: Rae Halsted    Mechanism of Injury: <principal problem not specified>    Date: 07/13/16  Hospital Day:   7 days - Admitted on: 07/06/2016    Injuries/Problems: Problem List:  2017-12: Peripheral vascular disease (CMS-HCC)  2017-11: Peripheral artery disease (CMS-HCC)      OVERNIGHT: extubated on precedex. Hg 7.0 received 1 uPRBCs    OBJECTIVE:   Physical Exam:  intermiten tachy  No resp distress  abd soft ND  L groin pico in place; incision on LLE in tact; foot warm; cannot palpate pulse over graft; doppler AT  R foot with doppler DP        Vitals signs:     Latest Entry  Range (last 24 hours)    Temperature: 101.8 F (38.8 C)  Temp  Avg: 102.8 F (39.3 C)  Min: 101.3 F (38.5 C)  Max: 104 F (40 C)    Blood pressure (BP): 91/50  BP  Min: 91/50  Max: 148/63    Heart Rate: 127  Pulse  Avg: 124.4  Min: 94  Max: 147    Respirations: 22  Resp  Avg: 25.4  Min: 15  Max: 36    SpO2: 95 %  SpO2  Avg: 99.2 %  Min: 93 %  Max: 100 %       No Data Recorded       No Data Recorded       No Data Recorded     Weight: 95 kg (209 lb 7 oz)  Percentage Weight Change (%): -6.77 %    I&O:  Intake/Output       07/12/16 0600 - 07/13/16 0559 07/13/16 0600 - 07/14/16 0559      0600-1759 3716-9678 Total 0600-1759 9381-0175 Total       Intake    I.V.  1926.9  5215 7141.9  100  -- 100    NG/GT  470  -- 470  --  -- --    Total Intake 2396.9 5215 7611.9 100 -- 100       Output    Urine  965  2525 3490  --  -- --    Drains  20  -- 20  --  -- --    Stool  400  750 1150  --  -- --    Total Output 1385 3275 4660 -- -- --       Net I/O     1011.9 1940 2951.9 100 -- 100            Labs:  CBC  Recent Labs      07/11/16   0547  07/12/16   0535  07/13/16   0302   WBCCOUNT  13.3*  18.8*  23.1*   HGB  8.3*  7.8*  7.7*   PLCTEL  174  166  192   BANDP   --    --   3.8   NEUTP  68.9   --    --     LYMPP2   --   14.0  12.5   MONP2   --   10.3  3.9          Chemistry  Recent Labs      07/12/16   1951  07/13/16   0302   SODIUM  136  139   K  3.9  3.9   CL  110*  112*   CO2  21  20*   BUN  12  8   CREAT  1.1  1.0   GLU  146*  142*   [place]  7.6*  7.2*   MG  2.0  1.7*   PHOS  1.6*  2.5     Recent Labs      07/11/16   0547  07/12/16   1305  07/12/16   1950  07/13/16   0302   ALK  48  98  61  56   AST  58*  RESULT IS INVALID BECAUSE HEMOLYSIS INDEX EXCEEDED ALLOWABLE LIMITS.  86*  74*   ALT  _0 TBILI  1.0  RESULT IS INVALID BECAUSE HEMOLYSIS INDEX EXCEEDED ALLOWABLE LIMITS.  0.7  0.7   DBILI  0.3*  0.1   --    --    ALB  3.3*  3.7*  2.9*  2.7*       Coags  Recent Labs      07/11/16   2138   07/12/16   1951  07/13/16   0302   PTT   --    < >  95.2*  73.4*   INR  1.33*   --    --    --     < > = values in this interval not displayed.       Medications:  Scheduled Meds   aspirin  81 mg Daily    chlordiazePOXIDE  75 mg Q8H    chlorhexidine  30 mL BID    potassium chloride-thiamine-multivitamin-folic acid infusion builder   Q24H    influenza vaccine >=3 yrs  0.5 mL Prior to discharge    LORazepam  2 mg Once    magnesium sulfate  4 g Once    metronidazole (FLAGYL) IVPB  500 mg Q8H NR    pantoprazole  40 mg Daily    QUEtiapine  150 mg Q8H    vancomycin  250 mg 4x Daily     PRN Meds   acetaminophen  650 mg Q4H PRN    fentaNYL  25 mcg Q5 Min PRN    fentaNYL  25 mcg Q2H PRN    LORazepam  1 mg Q5 Min PRN    LORazepam  1 mg Q2H PRN    LORazepam  1.5 mg Q30 Min PRN    Or    LORazepam  3 mg Q30 Min PRN    ondansetron  4 mg Q6H PRN    propofol  30 mg Q3 Min PRN    propofol  30 mg Q2H PRN    sodium chloride (PF)  10 mL PRN    sodium chloride (PF)  20 mL PRN    sodium chloride (PF)  50 mL Once PRN     IV Meds   fentaNYL 250 mcg/hr (07/13/16 0400)    heparin (porcine) 1,650 Units/hr (07/13/16 0631)    lactated ringers 300 mL/hr at 07/13/16 0622    LORazepam 10 mg/hr (07/13/16 0400)     propofol Stopped (07/13/16 0600)       Allergies:  Allergies   Allergen Reactions    No Known Allergies [Other] Other     Received name: No Known Allergies       OVERNIGHTl CPK> 5000, started on Rabdo protocol    ASSESSMENT / PLAN / RECOMMENDATIONS:  30M with a traumatic left lower extremity injury  3 years prior to presents with pain to left lower extremity concerning for claudication  S/p left popliteal to distal PT bypass  S/p Bypass revision, left popliteal to peroneal and peroneal to PT jump graft    07/06/16  LEFT POPLITEAL TO DISTAL POSTERIOR TIBIAL ARTERY BYPASS LEFT SAPHENOUS VEIN HARVEST AORTOGRAM LEFT LEG ANGIOGRAM LEFT POPLITEAL ARTERY ANGIOPLASTY (4X4, 5X4) LEFT DISTAL POSTERIOR TIBIAL ARTERY ANGIOPLASTY       07/07/16  Redo Left transinterosseous below-knee popliteal-peroneal and sequential peroneal-distal posterior tibal-medial tarsal reverse saphenous vein bypass; Left distal segmental fibula resection to expose distal peroneal artery,  Thrombectomy of left below-knee popliteal-posterior tibial reverse saphenous vein bypass    Neuro:   GCS 8T  - on ativan 4 and fentanyl 100 gtt.   - CIWA for h/o etoh Abuse and withdrawal ativan scale increased  -librium 75 q8hrs,seroquel 169m q8hrs  -aspirin, gabapentin  -tylenol prn, ativan prn  -Head CT 12/7 Negative    CV:BP  Min: 91/50  Max: 148/63  Pulse  Avg: 124.4  Min: 94  Max: 147  90s-120s/50s-60s  HR 130s  -EKG no changes  -started metop [id number] prn    #LLE claudication s/p bypass   - bypass likely thrombosed again  - arterial duplex 07/08/16: There is distal arterial flow noted in the posterior tibial artery. The bypass graft is not visualized.Mild stenosis at the left mid-distal SFA.  - ABI with AT on LLE is 0.63 07/08/16  - q1 neurovscular checks  - on heparin gtt 1650  - pulse ox monitor on LLE (currently at 100%)   -DVT Bilt LE + UE [place] 07/12/16  -CTA runoff 07/12/16 shows Nonopacified left popliteal to distal posterior  tibial artery bypass, suggesting graft failure.    Pulmonary:  Intubated AC/VC 12/500/5/40% satin 95-100%. Metabolic acidosis w/ resp compensation  Arterial Blood Gas result:  pH 7.37;  PCO2 35.6,  pO2 143, HCO3 20.1, %O2 Sat 99.  CT chest 07/12/16 shows no findings              FEN/GI:  KUB shows worsening colonic illeus. CT abdomen 07/12/16 shows pan colitis.  LR _0 /hr for rhabdo, tube feeds at goal  protonix, Replete electrolytes  Diet:NPO Reason: Surgery/Procedure; Tube Feed: Hold while NPO (if ordered)  Tube Feed Adult Impact Peptide 1.5 Cal; Adult Tube Feed Route: Nasogastric Feeding Tube; Water Flush Volume: 25 mL; Water Flush Frequency: 4 hr; Continuous or Intermittent: Continuous; Initial rate (mL/hour): 20; Rate Increase (mL/hour): 20; Rate ...     Renal:   Intake/Output Summary (Last 24 hours) at 07/13/16 0648  Last data filed at 07/13/16 0615   Gross per 24 hour   Intake          7536.89 ml   Output             4510 ml   Net          3026.89 ml     UOP: 3340 -> 140cc/hr  Drain 20  folley  -d/c f/u lactate 0.8  -f/u Cr 1.3 -> 1.0  -f/u bladder pressures today 14-15    Hematology:   hb 10.6 -> 8.9 ->8.0 -> 7.0 -> 9.3 ->8.3 ->7.8 ->7.7  Hg 7.0 received 1 uPRBCs 12/5  -no DVT ppx on board    ID: Temp  Avg: 102.8 F (39.3 C)  Min: 101.3 F (38.5 C)  Max: 104 F (40 C)  febrile WBC 17.6 -> 13.3 -> 18.8 -> 23.1; with multiple foul loose  stool; likely c. Diff colitis  Micro: BCx 12/1 NGTD, BC 12/5 pending  -UA 12/5 and 12/7 negative   -f/u STD labs  D/c'd ancef 2g q8hrs for wound per vascular  -f/u c. Diff pending  -f/u BCx NGTD  Started oral vanco, iv flagyl    Endocrine:  FSBS: < 180    MSK:  PT/OT:  No  Weight Bearing Status:  weight bear as tolerated. ABI 0.6. Xray shows right knee effusion  -f/u CPK 4000 -> 5493 -> 4656 ->3747 decrease LR from 300cc/hr to 200cc/hr  -on rhabdo protocol      ICU Care:    Feedings: yes  Analgesia: Yes  Sedation: Yes   Thromboprophylaxis:SCDs, early mobility protocol and subcutaneous heparin  Head-of-bed elevation: Yes  Ulcer prophylaxis: no  Glycemic control: no  GI Bowel care: Yes  Indwelling catheter removal: no  ID: de-escalation of Abx: yes  Spontaneous breathing trial: N/A    Family discussion: yes    DISPO: SICU    SICU Attending Attestation:    Remained in SICU overnight secondary to above diagnoses and due to concern for possible sudden deterioration. Patient met criteria for critical illness and ICU level of care during my evaluation and treatment due to the following diagnoses and/or management issues:    1. AMS - continues on present regimen, including ativan, fentanyl, seroquel  2. Acute hypoxic/hypercarbic respiratory failure  3. C-diff colitis with diarrhea - continue PO vanco and IV flagyl. AXR unchanged  4. Acute blood loss anemia - continue to closely monitor hemoglobin level. Transfusion is not required at present.    Critical Care Time = 31 minutes today, excluding teaching and procedures.   Charyl Bigger Herson Prichard MD  [facility] Clinical Professor  Department of Surgery

## 2016-07-13 NOTE — Progress Notes (Signed)
Progress Note    Patient Name: Jerome Hunt  MRN: [id number]  Room#: 6232/6232-01    Service: [place] Attending Provider: Wyn Forster, MD    Mechanism of Injury: LLE arterial insufficiency    Date: 07/13/16  Hospital Day:   7 days - Admitted on: 07/06/2016  Post Operative Day(s): 1 Day Post-Op    left lower extremity angioplasty, left popliteal-distal posterior tibial bypass with LLE GSV harvest 07/06/16  Revision left transinterossesous below-knee popliteal-peroneal and sequential peroneal-distal posterior tibial-medial tarsal reverse saphenous vein bypass  Left distal segmental fibula resection to expose distal peroneal artery, thrombectomy of left below-knee popliteal-posterior tibial reverse saphenous vein bypass 07/07/16    SUBJECTIVE: Jerome Hunt is a 40 year old ex smoker with a history of traumatic left lower extremity injury 3 years ago underwent left lower extremity angioplasty, left popliteal-distal posterior tibial bypass with LLE GSV harvest 07/06/16 by Dr Rae Halsted and re-do bypass by Dr Sherre Poot 07/07/16    Overnight events: Intubated overnight, Febrile to 38.8C. Hypotensive mainly, tachycardic 120s-140s. Started on empiric treatment for Cdiff colitis. Maintaining adequate urine output. Drain 20cc output.    OBJECTIVE:     GENERAL: NAD, intubated and sedated  HEENT: NC/AT EOMI MMM KFT, ET tube in place  NECK: supple  ABDOMEN: moderately distended, soft  GROIN: no evidence of hematoma at right femoral access site, pico dressing in place left groin  EXTREMITY: LLE with dressing c/d/i, palpable AT pulse  LLE drain in place, serosanguinous output  LLE warm to touch until dorsum of foot, dopplerable pulses RLE  LUE a line in place  Foley catheter in place, clear urine output      Lines, Drains and Airways:  Peripheral IV - 20 G Left Arm (Active)   Site Assessment Phlebitis 0 07/07/2016  4:00 AM   Line Lumen Status Infusing 07/07/2016  4:00 AM   Dressing Transparent 07/07/2016  4:00 AM    Dressing Status Clean, dry, intact 07/07/2016  4:00 AM       Peripheral IV - 18 G (Active)   Site Assessment Phlebitis 0 07/07/2016  4:00 AM   Line Lumen Status Infusing 07/07/2016  4:00 AM   Dressing Transparent 07/07/2016  4:00 AM   Dressing Status Clean, dry, intact 07/07/2016  4:00 AM       Arterial Line (ART) -  Left Radial (Active)   Site Assessment Phlebitis 0 07/07/2016  4:00 AM   Line Lumen Status Patent 07/07/2016  4:00 AM   Treatment Arm board on 07/07/2016  4:00 AM   Dressing Transparent 07/07/2016  4:00 AM   Dressing Status Clean, dry, intact 07/07/2016  4:00 AM       Vitals signs:     Latest Entry  Range (last 24 hours)    Temperature: 100.2 F (37.9 C)  Temp  Avg: 102.5 F (39.2 C)  Min: 98.6 F (37 C)  Max: 104 F (40 C)    Blood pressure (BP): 119/70  BP  Min: 91/50  Max: 148/63    Heart Rate: 131  Pulse  Avg: 124.6  Min: 94  Max: 147    Respirations: 18  Resp  Avg: 25.2  Min: 15  Max: 36    SpO2: 95 %  SpO2  Avg: 99.2 %  Min: 93 %  Max: 100 %       No Data Recorded       No Data Recorded       No Data Recorded     Weight: 95  kg (209 lb 7 oz)  Percentage Weight Change (%): -6.77 %    Labs:  CBC  Recent Labs      07/12/16   0535  07/13/16   0302   HGB  7.8*  7.7*   HCT  23.0*  23.4*        Chemistry  Recent Labs      07/12/16   1950  07/12/16   2052  07/13/16   0302  07/13/16   0506   K  3.9   --   3.9   --    CL  110*   --   112*   --    BICARB   --   19.7*   --   20.1*   BUN  12   --   8   --    CREAT  1.1   --   1.0   --    GLU  146*   --   142*   --    [place]  7.6*   --   7.2*   --    MG  2.0   --   1.7*   --    PHOS  1.6*   --   2.5   --      Recent Labs      07/11/16   0547  07/12/16   1305  07/12/16   1950  07/13/16   0302   ALK  48  98  61  56   AST  58*  RESULT IS INVALID BECAUSE HEMOLYSIS INDEX EXCEEDED ALLOWABLE LIMITS.  86*  74*   ALT  _0 TBILI  1.0  RESULT IS INVALID BECAUSE HEMOLYSIS INDEX EXCEEDED ALLOWABLE LIMITS.  0.7  0.7   DBILI  0.3*  0.1   --    --     ALB  3.3*  3.7*  2.9*  2.7*       Coags  Recent Labs      07/11/16   2138   07/12/16   1950  07/13/16   0302   PTT   --    < >  95.2*  73.4*   INR  1.33*   --    --    --     < > = values in this interval not displayed.       Medications:  Scheduled Meds   aspirin  81 mg Daily    chlordiazePOXIDE  75 mg Q8H    chlorhexidine  30 mL BID    potassium chloride-thiamine-multivitamin-folic acid infusion builder   Q24H    influenza vaccine >=3 yrs  0.5 mL Prior to discharge    LORazepam  2 mg Once    magnesium sulfate  4 g Once    metronidazole (FLAGYL) IVPB  500 mg Q8H NR    pantoprazole  40 mg Daily    QUEtiapine  150 mg Q8H    vancomycin  250 mg 4x Daily     PRN Meds   acetaminophen  650 mg Q4H PRN    fentaNYL  25 mcg Q5 Min PRN    fentaNYL  25 mcg Q2H PRN    LORazepam  1 mg Q5 Min PRN    LORazepam  1 mg Q2H PRN    LORazepam  1.5 mg Q30 Min PRN    Or    LORazepam  3 mg Q30 Min PRN    ondansetron  4 mg Q6H PRN    propofol  30 mg Q3 Min PRN    propofol  30 mg Q2H PRN    sodium chloride (PF)  10 mL PRN    sodium chloride (PF)  20 mL PRN    sodium chloride (PF)  50 mL Once PRN     IV Meds   fentaNYL 250 mcg/hr (07/13/16 0400)    heparin (porcine) 1,650 Units/hr (07/13/16 0631)    lactated ringers 300 mL/hr at 07/13/16 0622    LORazepam 10 mg/hr (07/13/16 0723)    propofol Stopped (07/13/16 0600)       Allergies:  Allergies   Allergen Reactions    No Known Allergies [Other] Other     Received name: No Known Allergies     12/7 CT head Preliminary Resident Report   No acute intraparenchymal hemorrhage tracks axial collection. No mass effect or hydrocephalus. No facial or calvarial fracture.    No CT evidence of acute infarct.    Extensive paranasal sinus mucosal thickening. Partially visualized endotracheal and enteric tubes.    12/7 CT chest  Preliminary Resident Report     A right PICC terminates at the cavoatrial junction. Well-positioned endotracheal tube. Partially visualized enteric tube.     Biapical pleural scarring. No airspace consolidation.    No central or proximal segmental PE is identified within the field-of-view on contrast-enhanced series. Contrast bolus timing is suboptimal for definitive assessment for distal segmental or subsegmental PE.    12/7 CT abdomen Preliminary Resident Report     There is multifocal dilatation and long segment wall thickening of the colon, with dilated loops measuring up to 6.5 cm. No drainable fluid collection, evidence of perforation, evidence of pneumatosis, or fistulization identified. A rectal tube is present. Normal appendix.    Enteric tube terminates in the proximal jejunum. Foley catheter is present. Air in the urinary bladder is likely related to instrumentation. Questionable bladder wall thickening may be related to indwelling Foley. Correlate for cystitis.    12/7 CTa runoff Preliminary Resident Report   There are postprocedural changes related to left great saphenous vein harvesting for left popliteal to distal left posterior tibial artery bypass. There is irregularity at the distal left popliteal artery seen on series 401, image 721, which may represent site of bypass anastomosis. The bypass graft does not opacify distal to the anastomosis. The posterior tibial artery is not well opacified, suggesting severe steno-occlusive disease. There is soft tissue stranding/edema throughout the distal left lower extremity. A surgical drain is seen within the deep myofascial tissues of the distal left foreleg.    Please refer to final dictation for full details related to peripheral vascular disease.    Evidence of prior right groin vascular access.There is a defect at the distal left fibular diaphysis with clear margins which may be posttraumatic or postsurgical in nature.       ASSESSMENT / PLAN / RECOMMENDATIONS:  Neuro/MSK:  Sedated- fentanyl, ativan gtt  Continue CIWA protocol, precedex  As per SICU  Trend CK 5210 ->5493 ->4656     CV:BP  Min: 91/50  Max: 148/63  Pulse  Avg: 124.6  Min: 94  Max: 147  Continue to monitor  Will discuss CTa runoff with vascular radiologist  Continue q1h vascular checks     Pulmonary:Resp  Avg: 25.2  Min: 15  Max: 36  SpO2  Avg: 99.2 %  Min: 93 %  Max: 100 %    Intubated, minimal vent settings  FEN/GI:  Dilated colon, long segment colonic wall thickening- pancolitis on CT 12/7  Improved abdominal exam this AM, multiple loose stools overnight  KFT in place, consider restart TF  GI PPX    Renal:   Intake/Output       07/12/16 0600 - 07/13/16 0559 07/13/16 0600 - 07/14/16 0559      9417-4081 4481-8563 Total 0600-1759 1497-0263 Total       Intake    I.V.  1926.9  5274.5 7201.4  435  -- 435    NG/GT  470  -- 470  --  -- --    Total Intake 2396.9 5274.5 7671.4 435 -- 435       Output    Urine  965  2525 3490  --  -- --    Drains  20  -- 20  --  -- --    Stool  400  750 1150  --  -- --    Total Output 1385 3275 4660 -- -- --       Net I/O     1011.9 1999.5 3011.4 435 -- 435        ID: Temp  Avg: 102.5 F (39.2 C)  Min: 98.6 F (37 C)  Max: 104 F (40 C)  Received peri-op abx, ancef (12/5-12/7)  Switched to Vanc/flagyl for empiric coverage of c-diff   Tmax 38.8C, WBC 23.1 today  Tylenol PRN fever  Blood cx negative, MRSA+  F/u cdiff cx    Hematology:  H/H stable 7.7/23.4    Endocrine:  Monitor blood sugars    Prophylaxis:  SCD RLE   Heparin gtt  GI ppx  Bowel regimen  Continue Foley catheter, intubated     Dispo:  SICU        Likely c diff colitis  Intubated  Anticoagulation  Iv abx cont  CK improved  Continue icu care  BL LE appear well perfused

## 2016-07-13 NOTE — Interdisciplinary (Signed)
SLP Contact       07/13/16 0810    Therapy Contact  Note    Therapy not provided at this time as Patient intubated and not appropriate for therapy. Will await new therapy consult if/when indicated.    Additional Comments Please re-consult speech pathology if/when extubated.

## 2016-07-13 NOTE — Plan of Care (Signed)
Problem: Promotion of health and safety  Goal: Promotion of Health and Safety  The patient remains safe, receives appropriate treatment and achieves optimal outcomes (physically, psychosocially, and spiritually) within the limitations of the disease process by discharge.   Outcome: Progressing toward goal, anticipate improvement over: next 12-24 hours      Comments:   Nursing Shift Summary    No data found.    Critical Value Notification for the past 12 hrs:   Lab Test Name Test Result Provider Name Comments   07/12/16 2055 aPTT 95.2 pharmacist S. Kim Heparin hold and rate changed         No data found.    Shift Comments for the past 12 hrs:   Comments   07/12/16 2200 Aileen NP notified pt extremely agitated after propofol weaned off and Ativan started, orders received.    07/13/16 0200 Dr. Stacie AcresMayer at bedside, updated on pt status, pt HR increase after transitioned from propofol into Ativan   07/13/16 0300 Dr. Stacie AcresMayer notified pt urine output less than 300 previous hour, no new orders received at present time    07/13/16 0400 Dr. Stacie AcresMayer notified pt urine output less than 300  for 2 hours, notified MD of lab results, no new orders received at present time

## 2016-07-14 ENCOUNTER — Inpatient Hospital Stay: Payer: No Typology Code available for payment source

## 2016-07-14 ENCOUNTER — Encounter: Payer: Self-pay | Admitting: General Surgery

## 2016-07-14 DIAGNOSIS — IMO0001 Reserved for inherently not codable concepts without codable children: Secondary | ICD-10-CM | POA: Diagnosis present

## 2016-07-14 DIAGNOSIS — R0989 Other specified symptoms and signs involving the circulatory and respiratory systems: Secondary | ICD-10-CM

## 2016-07-14 DIAGNOSIS — F10231 Alcohol dependence with withdrawal delirium: Secondary | ICD-10-CM

## 2016-07-14 DIAGNOSIS — R0602 Shortness of breath: Secondary | ICD-10-CM

## 2016-07-14 DIAGNOSIS — F10931 Alcohol use, unspecified with withdrawal delirium (CMS-HCC): Secondary | ICD-10-CM | POA: Diagnosis present

## 2016-07-14 DIAGNOSIS — A0472 Enterocolitis due to Clostridium difficile, not specified as recurrent: Secondary | ICD-10-CM | POA: Diagnosis present

## 2016-07-14 DIAGNOSIS — E46 Unspecified protein-calorie malnutrition: Secondary | ICD-10-CM

## 2016-07-14 LAB — APTT, BLOOD
PTT: 73.6 s — ABNORMAL HIGH (ref 24.1–36.3)
PTT: 80.3 s — ABNORMAL HIGH (ref 24.1–36.3)
PTT: 80.6 s — ABNORMAL HIGH (ref 24.1–36.3)

## 2016-07-14 LAB — CBC WITH DIFF, BLOOD
Bands % (M): 41.5 %
Bands Abs (M): 11.1 10*3/uL — ABNORMAL HIGH (ref 0.0–0.6)
Basophils %: 0 %
Basophils Absolute: 0 10*3/uL (ref 0.0–0.2)
Eosinophils %: 1.9 %
Eosinophils Absolute: 0.5 10*3/uL (ref 0.0–0.5)
Hematocrit: 22 % — ABNORMAL LOW (ref 39.5–50.0)
Hgb: 7.3 G/DL — ABNORMAL LOW (ref 13.5–16.9)
Lymphocytes %.: 14.2 %
Lymphocytes Absolute: 3.8 10*3/uL — ABNORMAL HIGH (ref 0.9–3.3)
MCH: 31.2 PG (ref 27.0–33.5)
MCHC: 33.1 G/DL (ref 32.0–35.5)
MCV: 94.1 FL (ref 81.5–97.0)
MPV: 10.1 FL (ref 7.2–11.7)
Monocytes %: 6.6 %
Monocytes Absolute: 1.8 10*3/uL — ABNORMAL HIGH (ref 0.0–0.8)
Nucleated RBCs: 1 /100 WBC
PLT Count: 223 10*3/uL (ref 150–400)
Platelet Morphology: NORMAL
RBC: 2.33 10*6/uL — ABNORMAL LOW (ref 3.70–5.00)
RDW-CV: 15.6 % — ABNORMAL HIGH (ref 11.6–14.4)
Seg Neutro % (M): 35.8 %
Seg Neutro Abs (M): 9.6 10*3/uL — ABNORMAL HIGH (ref 2.0–7.5)
White Bld Cell Count: 26.8 10*3/uL — ABNORMAL HIGH (ref 4.0–10.5)

## 2016-07-14 LAB — COMPREHENSIVE METABOLIC PANEL, BLOOD
ALT: 23 U/L (ref 7–52)
AST: 45 U/L — ABNORMAL HIGH (ref 13–39)
Albumin: 2.6 G/DL — ABNORMAL LOW (ref 4.2–5.5)
Alk Phos: 58 U/L (ref 34–104)
BUN: 5 mg/dL — ABNORMAL LOW (ref 7–25)
Bilirubin, Total: 0.5 mg/dL (ref 0.0–1.4)
CO2: 25 mmol/L (ref 21–31)
Calcium: 7.6 mg/dL — ABNORMAL LOW (ref 8.6–10.3)
Chloride: 109 mmol/L — ABNORMAL HIGH (ref 98–107)
Creat: 0.9 mg/dL (ref 0.7–1.3)
Electrolyte Balance: 5 mmol/L (ref 2–12)
Glucose: 140 mg/dL — ABNORMAL HIGH (ref 70–115)
Potassium: 3.8 mmol/L (ref 3.5–5.1)
Protein, Total: 5.1 G/DL — ABNORMAL LOW (ref 6.0–8.3)
Sodium: 139 mmol/L (ref 136–145)
eGFR - high estimate: >60 (ref >59)
eGFR - low estimate: >60 (ref >59)

## 2016-07-14 LAB — MAGNESIUM, BLOOD
Magnesium: 2.2 mg/dL (ref 1.9–2.7)
Magnesium: 2.1 mg/dL (ref 1.9–2.7)

## 2016-07-14 LAB — ARTERIAL BLOOD GAS
Base Excess: 0.7 mmol/L
HCO3: 25.3 mmol/L (ref 21.0–27.0)
Inspired O2: 0.4
O2 Sat: 98.4 % (ref 95.0–99.0)
Patient Temp: 37.7 DEG C
pCO2 (Temp Adjusted): 42.2 MMHG — ABNORMAL HIGH (ref 36.0–42.0)
pH (Temp Adjusted): 7.4 (ref 7.38–7.42)
pO2 (Temp Adjusted): 132.6 MMHG — ABNORMAL HIGH (ref 80.0–104.0)

## 2016-07-14 LAB — CPK-CREATINE PHOSPHOKINASE, BLOOD: CK: 1746 U/L — ABNORMAL HIGH (ref 30–223)

## 2016-07-14 MED ORDER — FOLIC ACID 200 MCG/ML ORAL SOLN (~~LOC~~ COMPOUNDED)
400.0000 ug | Freq: Every day | Status: DC
Start: 2016-07-14 — End: 2016-07-14

## 2016-07-14 MED ORDER — ACETAMINOPHEN 325 MG PO TABS
975.0000 mg | ORAL_TABLET | Freq: Three times a day (TID) | ORAL | Status: DC
Start: 2016-07-14 — End: 2016-07-18
  Administered 2016-07-14 – 2016-07-18 (×14): 975 mg via ORAL
  Filled 2016-07-14 (×14): qty 3

## 2016-07-14 MED ORDER — LACTATED RINGERS IV SOLN
INTRAVENOUS | Status: DC
Start: 2016-07-14 — End: 2016-07-15
  Administered 2016-07-14 – 2016-07-15 (×5): via INTRAVENOUS

## 2016-07-14 MED ORDER — VITAMIN B-1 50 MG OR TABS
100.0000 mg | ORAL_TABLET | Freq: Every day | ORAL | Status: DC
Start: 2016-07-14 — End: 2016-07-27
  Administered 2016-07-14 – 2016-07-27 (×14): 100 mg via ORAL
  Filled 2016-07-14 (×13): qty 2

## 2016-07-14 MED ORDER — FOLIC ACID 1 MG OR TABS
1.0000 mg | ORAL_TABLET | Freq: Every day | ORAL | Status: DC
Start: 2016-07-14 — End: 2016-07-27
  Administered 2016-07-14 – 2016-07-27 (×14): 1 mg via ORAL
  Filled 2016-07-14 (×14): qty 1

## 2016-07-14 MED ORDER — METOPROLOL TARTRATE 5 MG/5ML IV SOLN
2.5000 mg | Freq: Four times a day (QID) | INTRAVENOUS | Status: DC
Start: 2016-07-14 — End: 2016-07-17
  Administered 2016-07-14 – 2016-07-15 (×5): 2.5 mg via INTRAVENOUS
  Administered 2016-07-15: 5 mg via INTRAVENOUS
  Administered 2016-07-16 – 2016-07-17 (×6): 2.5 mg via INTRAVENOUS
  Administered 2016-07-17: 5 mg via INTRAVENOUS
  Administered 2016-07-17 (×2): 2.5 mg via INTRAVENOUS
  Filled 2016-07-14 (×13): qty 5

## 2016-07-14 MED ORDER — VANCOMYCIN HCL 500 MG IV SOLR
500.0000 mg | Freq: Four times a day (QID) | INTRAVENOUS | Status: DC
Start: 2016-07-14 — End: 2016-07-18
  Administered 2016-07-14 – 2016-07-18 (×17): 500 mg via RECTAL
  Filled 2016-07-14 (×29): qty 500

## 2016-07-14 NOTE — Progress Notes (Signed)
Progress Note  Surgical Intensive Care Unit    Patient Name: Jerome Hunt  MRN: [id number]     Admitting Service: [place] Attending Provider: Rae Halsted    Mechanism of Injury: <principal problem not specified>    Date: 07/14/16  Hospital Day:   8 days - Admitted on: 07/06/2016    Injuries/Problems: Problem List:  2017-12: Peripheral vascular disease (CMS-HCC)  2017-11: Peripheral artery disease (CMS-HCC)      OBJECTIVE:   Physical Exam:  intermiten tachy  No resp distress  abd soft ND  L groin pico in place; incision on LLE in tact; foot warm; cannot palpate pulse over graft; doppler AT  R foot with doppler DP    Vitals signs:     Latest Entry  Range (last 24 hours)    Temperature: 97.9 F (36.6 C)  Temp  Avg: 100.6 F (38.1 C)  Min: 97.9 F (36.6 C)  Max: 101.7 F (38.7 C)    Blood pressure (BP): 132/83  BP  Min: 103/58  Max: 145/70    Heart Rate: 132  Pulse  Avg: 132.7  Min: 124  Max: 142    Respirations: 14  Resp  Avg: 19.1  Min: 12  Max: 30    SpO2: 100 %  SpO2  Avg: 99.9 %  Min: 99 %  Max: 100 %       No Data Recorded       No Data Recorded       No Data Recorded     Weight: 100 kg (220 lb 7.4 oz)  Percentage Weight Change (%): 5.26 %    I&O:  Intake/Output       07/13/16 0600 - 07/14/16 0559 07/14/16 0600 - 07/15/16 0559      0600-1759 3785-8850 Total 0600-1759 2774-1287 Total       Intake    I.V.  5624.3  4172.5 9796.7  327.5  -- 327.5    NG/GT  120  50 170  --  -- --    Total Intake 5744.3 4222.5 9966.7 327.5 -- 327.5       Output    Urine  1090  1340 2430  100  -- 100    Stool  700  1200 1900  --  -- --    Total Output 1790 2540 4330 100 -- 100       Net I/O     3954.3 1682.5 5636.7 227.5 -- 227.5            Labs:  CBC  Recent Labs      07/12/16   0535  07/13/16   0302  07/14/16   0420   WBCCOUNT  18.8*  23.1*  26.8*   HGB  7.8*  7.7*  7.3*   PLCTEL  166  192  223   BANDP   --   3.8   --    LYMPP2  14.0  12.5   --    MONP2  10.3  3.9   --           Chemistry  Recent Labs      07/13/16   0302   07/13/16   2001  07/14/16   0420   SODIUM  139  139  139   K  3.9  4.1  3.8   CL  112*  110*  109*   CO2  20*  25  25   BUN  8  5*  5*   CREAT  1.0  0.9  0.9   GLU  142*  159*  140*   [place]  7.2*  7.5*  7.6*   MG  1.7*  2.4  2.2   PHOS  2.5  1.2*   --      Recent Labs      07/12/16   1305   07/13/16   2001  07/14/16   0420   ALK  98   < >  60  58   AST  RESULT IS INVALID BECAUSE HEMOLYSIS INDEX EXCEEDED ALLOWABLE LIMITS.   < >  51*  45*   ALT  31   < >  24  23   TBILI  RESULT IS INVALID BECAUSE HEMOLYSIS INDEX EXCEEDED ALLOWABLE LIMITS.   < >  0.5  0.5   DBILI  0.1   --    --    --    ALB  3.7*   < >  2.6*  2.6*    < > = values in this interval not displayed.       Coags  Recent Labs      07/11/16   2138   07/14/16   0003  07/14/16   0420   PTT   --    < >  73.6*  80.3*   INR  1.33*   --    --    --     < > = values in this interval not displayed.       Medications:  Scheduled Meds  . aspirin  81 mg Daily   . chlordiazePOXIDE  75 mg Q8H   . chlorhexidine  30 mL BID   . potassium chloride-thiamine-multivitamin-folic acid infusion builder   Q24H   . influenza vaccine >=3 yrs  0.5 mL Prior to discharge   . metronidazole (FLAGYL) IVPB  500 mg Q8H NR   . pantoprazole  40 mg Daily   . QUEtiapine  150 mg Q8H   . vancomycin  250 mg 4x Daily     PRN Meds  . acetaminophen  650 mg Q4H PRN   . fentaNYL  25 mcg Q5 Min PRN   . fentaNYL  25 mcg Q2H PRN   . LORazepam  1 mg Q5 Min PRN   . LORazepam  1 mg Q2H PRN   . LORazepam  1.5 mg Q30 Min PRN    Or   . LORazepam  3 mg Q30 Min PRN   . metoprolol  5 mg Q6H PRN   . ondansetron  4 mg Q6H PRN   . propofol  30 mg Q3 Min PRN   . propofol  30 mg Q2H PRN   . sodium chloride (PF)  10 mL PRN   . sodium chloride (PF)  20 mL PRN   . sodium chloride (PF)  50 mL Once PRN     IV Meds  . fentaNYL 150 mcg/hr (07/14/16 0600)   . heparin (porcine) 1,950 Units/hr (07/14/16 0600)   . lactated ringers 150 mL/hr at 07/14/16 0443   . LORazepam 5 mg/hr (07/14/16 0600)   . propofol Stopped (07/13/16 0600)            Allergies:  Allergies   Allergen Reactions   . No Known Allergies [Other] Other     Received name: No Known Allergies       ASSESSMENT / PLAN / RECOMMENDATIONS:  4M with a traumatic left lower extremity injury 3 years prior to presents with pain to left lower  extremity concerning for claudication  S/p left popliteal to distal PT bypass  S/p Bypass revision, left popliteal to peroneal and peroneal to PT jump graft    07/06/16  LEFT POPLITEAL TO DISTAL POSTERIOR TIBIAL ARTERY BYPASS LEFT SAPHENOUS VEIN HARVEST AORTOGRAM LEFT LEG ANGIOGRAM LEFT POPLITEAL ARTERY ANGIOPLASTY (4X4, 5X4) LEFT DISTAL POSTERIOR TIBIAL ARTERY ANGIOPLASTY       07/07/16  Redo Left transinterosseous below-knee popliteal-peroneal and sequential peroneal-distal posterior tibal-medial tarsal reverse saphenous vein bypass; Left distal segmental fibula resection to expose distal peroneal artery,  Thrombectomy of left below-knee popliteal-posterior tibial reverse saphenous vein bypass    Neuro:   GCS 10T  - on ativan 5 and fentanyl 150 gtt.   - CIWA for h/o etoh Abuse and withdrawal ativan scale increased  -librium 75 q8hrs,seroquel 11m q8hrs  -aspirin, gabapentin  -tylenol prn, ativan prn  Thiamine folate  -Head CT 12/7 Negative    CV:BP  Min: 103/58  Max: 145/70  Pulse  Avg: 132.7  Min: 124  Max: 142  120s-130s/70s  HR 130s  - metop 56mq6hrs     #LLE claudication s/p bypass   - bypass likely thrombosed again  - arterial duplex 07/08/16: There is distal arterial flow noted in the posterior tibial artery. The bypass graft is not visualized.Mild stenosis at the left mid-distal SFA.  - ABI with AT on LLE is 0.63 07/08/16  - q1 neurovscular checks  - on heparin gtt 1900  - pulse ox monitor on LLE (currently at 100%)   -DVT Bilt LE + UE [place] 07/12/16  -CTA runoff 07/12/16 shows Nonopacified left popliteal to distal posterior tibial artery bypass, suggesting graft failure.    Pulmonary:  Intubated AC/VC 12/500/5/40% satin 95-100%.    Arterial Blood Gas result:  pH 7.40;  PCO2 42,  pO2 132, HCO3 25, %O2 Sat 98.  CT chest 07/12/16 shows no findings              FEN/GI:  KUB shows worsening colonic illeus. CT abdomen 07/12/16 shows pan colitis.  D5NS @100 /hr   tube feeds held  protonix, Replete electrolytes  Diet:NPO Reason: Surgery/Procedure; Tube Feed: Hold while NPO (if ordered)  Tube Feed Adult Impact Peptide 1.5 Cal; Adult Tube Feed Route: Nasogastric Feeding Tube; Water Flush Volume: 25 mL; Water Flush Frequency: 4 hr; Continuous or Intermittent: Continuous; Initial rate (mL/hour): 20; Rate Increase (mL/hour): 20; Rate ...     Renal:   +9.8  -4.3  =+5.5    Drain 20  folley  -d/c f/u lactate 0.8  -f/u Cr 1.3 -> 1.0 -> 1.0  -f/u bladder pressures today 14-15    Hematology:   hb 10.6 -> 8.9 ->8.0 -> 7.0 -> 9.3 ->8.3 ->7.8 ->7.7 ->7.3  Hg 7.0 received 1 uPRBCs 12/5  -no DVT ppx on board  D/c'd central, placed PICC    ID: Temp  Avg: 100.6 F (38.1 C)  Min: 97.9 F (36.6 C)  Max: 101.7 F (38.7 C)  febrile WBC 17.6 -> 13.3 -> 18.8 -> 23.1 ->26; with multiple foul loose stool; likely c. Diff colitis  Micro: BCx 12/1 NGTD, BC 12/5 pending  -UA 12/5 and 12/7 negative   -f/u STD labs  D/c'd ancef 2g q8hrs for wound per vascular  -f/u c. Diff pending  -f/u BCx NGTD  Started vanco enema, D/c'd oral vanco, iv flagyl    Endocrine:  FSBS: < 180    MSK:  PT/OT:  No  Weight Bearing Status:  weight bear  as tolerated. ABI 0.6. Xray shows right knee effusion  -f/u CPK 4000 -> 5493 -> 4656 ->3747 -> 1746       ICU Care:    Feedings: yes  Analgesia: Yes  Sedation: Yes  Thromboprophylaxis:SCDs, early mobility protocol and subcutaneous heparin  Head-of-bed elevation: Yes  Ulcer prophylaxis: no  Glycemic control: no  GI Bowel care: Yes  Indwelling catheter removal: no  ID: de-escalation of Abx: yes  Spontaneous breathing trial: N/A    Family discussion: yes    DISPO: SICU    ATTENDING CRITICAL CARE PROGRESS NOTE AND ATTESTATION (COMPLETED BY Dorothyann Gibbs, MD  )    Subjective  40 y/o male with prior traumatic injury to leg and redo of lower extremity bypass graft.  Graft currently does not have flow.  Hospital course complicated by sever alcohol withdrawal and patient prolonged intubation    I have reviewed the history.  Overnight events:  Continued severe diarrhea and agitation     Objective    I have examined the patient and concur with the resident exam.    Assessment and Plan    Jerome Hunt remained in SICU overnight secondary to diagnoses discussed below and due to concern for possible sudden deterioration.    I agree with the resident care plan.  The patient will continue to require ICU level of care.   Today's recommendations: .      Neuro:Delirium tremens. Previously on precedex gtt. Now intubated on ativan GCS 10T , RASS -1 , CAM -  Ativan gtt/fentanyl. Seroquel and librium. Folate. Thiamine. MVI   Cardio: HDS. Tachycardia improving. 120's-130's   Pulm: acute respiratory failure on mechanical ventilation 40%/5 peep   GI: Severe cdiff colitis. On oral vanco and IV flagyl started 7th afternoon. Stool output 1.9 wbc remains 26 from 23. Start vanco enemas. If no improvement or worsening will need operative intervention. TF held due to severe diarrhea recommend trickle feeds 10 cc do not advance   Renal: 9.8/4.3 previously on rhabdo protocol for rising CK now improving fluids decreased to 150   ID: cdiff colitis on oral vanco and IV flagyl    add rectal vanc   Heme: acute blood loss anemia hgb stable 7.3   Endo: FSBS <180  proph: heparin gtt for graft.  Vasc: Popliteal-posterior tibial artery bypass on  12/1 with resulting graft failure and reoperation on 12/2 and underwent Popliteal to peroneal bypass with jump graft from peroneal artery to posterior tibial artery - this graft has subsequently gone down as well. has collaterals and flow to the foot via AT   Lines/tubes/drains: PICC line foley aline   See the resident / fellow note for further details.     This  patient was seen and examined by myself and the critical care service.  The patient's condition was discussed in a system based manor.  Changes to the patient care were updated based on the patient's condition and discussion of care goals on rounds with the team and were updated in note by the resident. Critical Care Time evaluating, managing, providing care and documenting critical care activities  = 35   minutes, during which I was immediately available to this patient. Time involved in the performance of separately reportable procedures was not counted toward critical care time.     Patient met criteria for critical illness and ICU level of care during my evaluation and treatment due to the following diagnoses and/or management issues:  1. Ventilator management  2.  Altered mental status   3.  Severe alcohol with drawl and sedation   4. Colonic ileus and malnutrition   5.  .Acute kidney injury   6. Severe c. Diff colitis   7. rhabdomyolysis         Dorothyann Gibbs MD (A)  Associate Clinical Professor of Surgery   Division of Trauma Burn and Surgical Critical Care   Pager [phone]  Office [phone]  Burn Unit [phone] and [facility] [phone]

## 2016-07-14 NOTE — Progress Notes (Signed)
Progress Note                Surgery-Vascular    Patient Name: Jerome Hunt  MRN: [id number]  Room#: 6232/6232-01    Hospital Day:   8 days - Admitted on: 07/06/2016  Service: Surgery-Vascular    40 year old male admitted with <principal problem not specified>    SUBJECTIVE: no acute overnight events  Patient remains intermittently febrile   Intubated and sedated     OBJECTIVE:    Pain score: CPOT    Vital Signs:     Latest Entry  Range (last 24 hours)    Temperature: 97.9 F (36.6 C)  Temp  Avg: 100.6 F (38.1 C)  Min: 97.9 F (36.6 C)  Max: 101.7 F (38.7 C)    Blood pressure (BP): 132/83  BP  Min: 106/58  Max: 145/70    Heart Rate: 132  Pulse  Avg: 132.4  Min: 124  Max: 142    Respirations: 14  Resp  Avg: 19  Min: 12  Max: 30    SpO2: 100 %  SpO2  Avg: 99.9 %  Min: 99 %  Max: 100 %     Weight: 100 kg (220 lb 7.4 oz)  Percentage Weight Change (%): 5.26 %    12/08 0600 - 12/09 0559  In: 9966.7 [I.V.:9796.7]  Out: 4330 [Urine:2430]  Bowel movement: flexiseal in place       Intake/Output Summary (Last 24 hours) at 07/14/16 0814  Last data filed at 07/14/16 0600   Gross per 24 hour   Intake          9194.74 ml   Output             4175 ml   Net          5019.74 ml           Physical:    General Appearance: sedated, intubated  Heart:  Sinus tachycardia .  Lungs: bilateral breath sounds present. ETT in place .  Abdomen: soft, distended. nonperitoneal   Extremities:  RLE: dopplerable PT and DP signals present  LLE: wound without erythema. Intact. Dressing in place. Dopplerable AT signal. Toes appear somewhat ischemic with some pallor     Glasgow Coma Scale Score: 10    Labs:     CBC  Recent Labs      07/13/16   0302  07/14/16   0420   HGB  7.7*  7.3*   HCT  23.4*  22.0*        Chemistry  Recent Labs      07/13/16   0302  07/13/16   0506  07/13/16   2001  07/14/16   0420  07/14/16   0617   K  3.9   --   4.1  3.8   --    CL  112*   --   110*  109*   --    BICARB   --   20.1*   --    --   25.3    BUN  8   --   5*  5*   --    CREAT  1.0   --   0.9  0.9   --    GLU  142*   --   159*  140*   --    [place]  7.2*   --   7.5*  7.6*   --    MG  1.7*   --  2.4  2.2   --    PHOS  2.5   --   1.2*   --    --      Recent Labs      07/12/16   1305   07/13/16   2001  07/14/16   0420   ALK  98   < >  60  58   AST  RESULT IS INVALID BECAUSE HEMOLYSIS INDEX EXCEEDED ALLOWABLE LIMITS.   < >  51*  45*   ALT  31   < >  24  23   TBILI  RESULT IS INVALID BECAUSE HEMOLYSIS INDEX EXCEEDED ALLOWABLE LIMITS.   < >  0.5  0.5   DBILI  0.1   --    --    --    ALB  3.7*   < >  2.6*  2.6*    < > = values in this interval not displayed.          Coags  Recent Labs      07/11/16   2138   07/14/16   0003  07/14/16   0420   PTT   --    < >  73.6*  80.3*   INR  1.33*   --    --    --     < > = values in this interval not displayed.       Cultures:  C diff +       Radiology:         Medications:  Scheduled Meds   aspirin  81 mg Daily    chlordiazePOXIDE  75 mg Q8H    chlorhexidine  30 mL BID    potassium chloride-thiamine-multivitamin-folic acid infusion builder   Q24H    influenza vaccine >=3 yrs  0.5 mL Prior to discharge    metronidazole (FLAGYL) IVPB  500 mg Q8H NR    pantoprazole  40 mg Daily    QUEtiapine  150 mg Q8H    vancomycin  250 mg 4x Daily     PRN Meds   acetaminophen  650 mg Q4H PRN    fentaNYL  25 mcg Q5 Min PRN    fentaNYL  25 mcg Q2H PRN    LORazepam  1 mg Q5 Min PRN    LORazepam  1 mg Q2H PRN    LORazepam  1.5 mg Q30 Min PRN    Or    LORazepam  3 mg Q30 Min PRN    metoprolol  5 mg Q6H PRN    ondansetron  4 mg Q6H PRN    propofol  30 mg Q3 Min PRN    propofol  30 mg Q2H PRN    sodium chloride (PF)  10 mL PRN    sodium chloride (PF)  20 mL PRN    sodium chloride (PF)  50 mL Once PRN       ASSESSMENT / PLAN:    40 year old male admitted with 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 Patient  underwent attempted Popliteal-posterior tibial artery bypass on  12/1 with resulting graft failure and reoperation on 12/2 and underwent Popliteal to peroneal bypass with jump graft from peroneal artery to posterior tibial artery - this graft has subsequently gone down as well.     Postoperatively he developed acute alcohol withdrawal due to his undisclosed alcohol abuse.  Due to his clinical decline and the need for  further imaging studies he was intubated and sedated which has been controlling his agitation fairly well.   He currently has a active c diff infection without signs toxic megacolon.      Neuro continue Ativan and fentanyl sedation  Cardiovascular:  Tachycardic due to infection and alcoholic withdrawal  Pulmonary acute respiratory failure intubated and sedated minimal vent settings wean vent as tolerated extubation per critical care team when safe  GI:  Keo feed in place active C diff colitis currently undergoing treatment tube feeds held at this time we will discuss restarting feeds with ICU team  Renal:  Normal creatinine continue Foley catheter for critical illness and close urine monitoring rhabdomyolysis has resolved  Heme: acute blood loss anemia -hemoglobin 7.3 today likely some element of dilutional component given no active signs of bleeding, continue heparin drip   Id:  C diff colitis -continue vancomycin and Flagyl, leukocytosis worse today to 26 febrile -if patient does not improve may need to consider colonic lavage if acutely decompensates will need emergent colectomy  Endocrine insulin sliding scale  Disposition critical care    Phillips Grout, MD   PGY 5  pgr [phone]            Wean vent  Dopplerable signals BL  Foot continues to be viable  ABX per ID  Pain mgmt  Wean vent as tolerated

## 2016-07-14 NOTE — Plan of Care (Signed)
Problem: Promotion of health and safety  Goal: Promotion of Health and Safety  The patient remains safe, receives appropriate treatment and achieves optimal outcomes (physically, psychosocially, and spiritually) within the limitations of the disease process by discharge.   Outcome: Progressing toward goal, anticipate improvement over: next 12-24 hours  Nursing Shift Summary    No data found.    No data found.      No data found.    Shift Comments for the past 12 hrs:   Comments   07/13/16 2000 Dr. Stacie AcresMayer at bedside, notified MD Metoprolol given, HR improved from 140's to 120's, no new orders received at present time.    07/14/16 0200 Dr. Stacie AcresMayer at bedside, notified of pt flexiseal output

## 2016-07-14 NOTE — Progress Notes (Signed)
Heparin per Pharmacy Protocol - Progress Note    40 year old male continues on heparin anticoagulation for lower extremity ischemia.  -- aPTT Goal:   65 - 90 seconds  --admitted: 12/1 for LLE pain; now s/p bypass  --history of LLE injury 3 years ago; also EtOH, tob  --no oral anticoagulants prior to admission  --190 cm  --100 kg      Heparin Rates and aPTT Results  Heparin 1950 units/hr 12/8 1739  aPTT  --73.6 12/9 00:03  --80.3 12/9 04:20  --80.3 12/9 06:00  --80.6 12/9 10:20      Lab Results   Component Value Date    HGB 7.3 (L) 07/14/2016    HGB 7.7 (L) 07/13/2016    HGB 7.8 (L) 07/12/2016      Lab Results   Component Value Date    HCT 22.0 (L) 07/14/2016    HCT 23.4 (L) 07/13/2016    HCT 23.0 (L) 07/12/2016     Platelets  --223K 12/9 04:20    PT/INR  No results found for: PT   Lab Results   Component Value Date    INR 1.33 (H) 07/11/2016               Assessment  --aPTT has been stable on heparin infusion rate approximately 20 units/kg/hr  --No bleeding observed by nurse      Plan  --Continue heparin protocol to maintain within goal 65-90  --Reduce aPTT monitoring to every 12 hours

## 2016-07-14 NOTE — Plan of Care (Signed)
Problem: Promotion of health and safety  Goal: Promotion of Health and Safety  The patient remains safe, receives appropriate treatment and achieves optimal outcomes (physically, psychosocially, and spiritually) within the limitations of the disease process by discharge.   Outcome: Progressing toward goal, anticipate improvement over: >48 hours   07/14/16 1741   Adult/Peds Plan of Care   Standard of Care/Policy Critical Care SOC;[facility];Rest[facility] SOC   Outcome Evaluation (rationale for progressing/not progessing pt continues to follow commands, comfortable on sedation, stable on the vent. requiring frequent suction - oral and inline. remains tachy. and febrile. tylenol ordered around the clock. continues to have diarrhea. good UOP. Banana bag D/C'ed, transitioned to PO vitamins. pulses stable. neuro checks changed to q4hrs. continuing to hold tube feeds   Patient Specific Goal, Promotion of Health & Safety conintue to let rest, treat infection. monitor stool output. may need surgical intervention if colitis does not improve. trend PTT. pulmonary toilet.    Additional Individualized Interventions/Recommendations  q 1 vascular     Nursing Shift Summary    No data found.    No data found.      Rounding for the past 12 hrs:   Physician Rounding   07/14/16 1000 My nursing colleague or I joined the physician during  his/her interaction/rounds with the patient at least once during my shift       Shift Comments for the past 12 hrs:   Comments   07/14/16 0730 dr Tonna Cornerwhealon bedside to assess pt   07/14/16 1000 sicu team rounding.

## 2016-07-15 ENCOUNTER — Inpatient Hospital Stay: Payer: No Typology Code available for payment source

## 2016-07-15 DIAGNOSIS — J90 Pleural effusion, not elsewhere classified: Secondary | ICD-10-CM

## 2016-07-15 DIAGNOSIS — K598 Other specified functional intestinal disorders: Secondary | ICD-10-CM

## 2016-07-15 LAB — MAGNESIUM, BLOOD
Magnesium: 2 mg/dL (ref 1.9–2.7)
Magnesium: 2 mg/dL (ref 1.9–2.7)

## 2016-07-15 LAB — HEMOGLOBIN CRITICAL VALUE CALL

## 2016-07-15 LAB — HEMOGRAM, BLOOD
Hematocrit: 24.8 % — ABNORMAL LOW (ref 39.5–50.0)
Hgb: 8 G/DL — ABNORMAL LOW (ref 13.5–16.9)
MCH: 30.1 PG (ref 27.0–33.5)
MCHC: 32.2 G/DL (ref 32.0–35.5)
MCV: 93.3 FL (ref 81.5–97.0)
MPV: 9.6 FL (ref 7.2–11.7)
PLT Count: 321 10*3/uL (ref 150–400)
RBC: 2.66 10*6/uL — ABNORMAL LOW (ref 3.70–5.00)
RDW-CV: 15.1 % — ABNORMAL HIGH (ref 11.6–14.4)
White Bld Cell Count: 22.4 10*3/uL — ABNORMAL HIGH (ref 4.0–10.5)

## 2016-07-15 LAB — CBC WITH DIFF, BLOOD
Bands % (M): 19.8 %
Bands % (M): 25.7 %
Bands Abs (M): 4.8 10*3/uL — ABNORMAL HIGH (ref 0.0–0.6)
Bands Abs (M): 6.1 10*3/uL — ABNORMAL HIGH (ref 0.0–0.6)
Basophils %: 0 %
Basophils %: 0 %
Basophils Absolute: 0 10*3/uL (ref 0.0–0.2)
Basophils Absolute: 0 10*3/uL (ref 0.0–0.2)
Eosinophils %: 1.9 %
Eosinophils %: 1.9 %
Eosinophils Absolute: 0.5 10*3/uL (ref 0.0–0.5)
Eosinophils Absolute: 0.5 10*3/uL (ref 0.0–0.5)
Hematocrit: 20.4 % — CL (ref 39.5–50.0)
Hematocrit: 19.9 % — CL (ref 39.5–50.0)
Hgb: 6.7 G/DL — CL (ref 13.5–16.9)
Hgb: 6.7 G/DL — CL (ref 13.5–16.9)
Lymphocytes %.: 16 %
Lymphocytes %.: 9.5 %
Lymphocytes Absolute: 3.9 10*3/uL — ABNORMAL HIGH (ref 0.9–3.3)
Lymphocytes Absolute: 2.3 10*3/uL (ref 0.9–3.3)
MCH: 30.6 PG (ref 27.0–33.5)
MCH: 31.5 PG (ref 27.0–33.5)
MCHC: 32.8 G/DL (ref 32.0–35.5)
MCHC: 33.7 G/DL (ref 32.0–35.5)
MCV: 93.3 FL (ref 81.5–97.0)
MCV: 93.5 FL (ref 81.5–97.0)
MPV: 10.1 FL (ref 7.2–11.7)
MPV: 10.3 FL (ref 7.2–11.7)
Metamyelocytes %: 1 %
Metamyelocytes %: 1.9 %
Metamyelocytes Absolute: 0.2 10*3/uL — ABNORMAL HIGH
Metamyelocytes Absolute: 0.5 10*3/uL — ABNORMAL HIGH
Monocytes %: 10.4 %
Monocytes %: 5.7 %
Monocytes Absolute: 2.5 10*3/uL — ABNORMAL HIGH (ref 0.0–0.8)
Monocytes Absolute: 1.4 10*3/uL — ABNORMAL HIGH (ref 0.0–0.8)
Myelocytes %: 2.9 %
Myelocytes Absolute: 0.7 10*3/uL — ABNORMAL HIGH
PLT Count: 282 10*3/uL (ref 150–400)
PLT Count: 280 10*3/uL (ref 150–400)
RBC: 2.18 10*6/uL — ABNORMAL LOW (ref 3.70–5.00)
RBC: 2.13 10*6/uL — ABNORMAL LOW (ref 3.70–5.00)
RDW-CV: 15.4 % — ABNORMAL HIGH (ref 11.6–14.4)
RDW-CV: 15.3 % — ABNORMAL HIGH (ref 11.6–14.4)
Seg Neutro % (M): 50.9 %
Seg Neutro % (M): 52.4 %
Seg Neutro Abs (M): 12.3 10*3/uL — ABNORMAL HIGH (ref 2.0–7.5)
Seg Neutro Abs (M): 12.4 10*3/uL — ABNORMAL HIGH (ref 2.0–7.5)
White Bld Cell Count: 24.6 10*3/uL — ABNORMAL HIGH (ref 4.0–10.5)
White Bld Cell Count: 23.7 10*3/uL — ABNORMAL HIGH (ref 4.0–10.5)

## 2016-07-15 LAB — BASIC METABOLIC PANEL, BLOOD
BUN: 6 mg/dL — ABNORMAL LOW (ref 7–25)
CO2: 29 mmol/L (ref 21–31)
Calcium: 8 mg/dL — ABNORMAL LOW (ref 8.6–10.3)
Chloride: 109 mmol/L — ABNORMAL HIGH (ref 98–107)
Creat: 0.9 mg/dL (ref 0.7–1.3)
Electrolyte Balance: 4 mmol/L (ref 2–12)
Glucose: 112 mg/dL (ref 70–115)
Potassium: 3.3 mmol/L — ABNORMAL LOW (ref 3.5–5.1)
Sodium: 142 mmol/L (ref 136–145)
eGFR - high estimate: >60 (ref >59)
eGFR - low estimate: >60 (ref >59)

## 2016-07-15 LAB — ARTERIAL BLOOD GAS
Base Excess: 4.1 mmol/L
HCO3: 29.2 mmol/L — ABNORMAL HIGH (ref 21.0–27.0)
Inspired O2: 0.4
O2 Sat: 99.2 % — ABNORMAL HIGH (ref 95.0–99.0)
Patient Temp: 36.8 DEG C
pCO2 (Temp Adjusted): 47.2 MMHG — ABNORMAL HIGH (ref 36.0–42.0)
pH (Temp Adjusted): 7.41 (ref 7.38–7.42)
pO2 (Temp Adjusted): 140.2 MMHG — ABNORMAL HIGH (ref 80.0–104.0)

## 2016-07-15 LAB — PTT CRT VAL CALL

## 2016-07-15 LAB — CPK-CREATINE PHOSPHOKINASE, BLOOD: CK: 1597 U/L — ABNORMAL HIGH (ref 30–223)

## 2016-07-15 LAB — BLOOD CULTURE: Culture Result: NO GROWTH

## 2016-07-15 LAB — APTT, BLOOD
PTT: 80.7 s — ABNORMAL HIGH (ref 24.1–36.3)
PTT: 97.6 s (ref 24.1–36.3)
PTT: 113.7 s (ref 24.1–36.3)

## 2016-07-15 LAB — HEMATOCRIT CRITICAL VALUE CALL

## 2016-07-15 LAB — PHOSPHORUS, BLOOD: Phosphorus: 2.6 MG/DL (ref 2.5–5.0)

## 2016-07-15 MED ORDER — SODIUM CHLORIDE 0.9 % IV SOLN
Freq: Once | INTRAVENOUS | Status: DC | PRN
Start: 2016-07-15 — End: 2016-07-17

## 2016-07-15 MED ORDER — HEPARIN 25,000 UNITS/250 ML 1/2 NS PREMIX INFUSION (~~LOC~~)
2100.0000 [IU]/h | INTRAMUSCULAR | Status: DC
Start: 2016-07-15 — End: 2016-07-21
  Administered 2016-07-16: 1700 [IU]/h via INTRAVENOUS
  Administered 2016-07-16: 1600 [IU]/h via INTRAVENOUS
  Administered 2016-07-16 – 2016-07-19 (×41): 1700 [IU]/h via INTRAVENOUS
  Administered 2016-07-20 – 2016-07-21 (×14): 1900 [IU]/h via INTRAVENOUS
  Filled 2016-07-15 (×11): qty 250

## 2016-07-15 MED ORDER — DEXTROSE-NACL 5-0.45 % IV SOLN (CUSTOM)
INTRAVENOUS | Status: DC
Start: 2016-07-15 — End: 2016-07-17
  Administered 2016-07-15 – 2016-07-17 (×4): via INTRAVENOUS

## 2016-07-15 MED ORDER — VANCOMYCIN 50 MG/ML ORAL SOLN (COMPOUNDED) (~~LOC~~)
250.0000 mg | Freq: Four times a day (QID) | ORAL | Status: DC
Start: 2016-07-15 — End: 2016-07-16
  Administered 2016-07-15 (×5): 250 mg via ORAL
  Filled 2016-07-15 (×9): qty 5

## 2016-07-15 MED ORDER — FUROSEMIDE 10 MG/ML IJ SOLN
20.00 mg | Freq: Once | INTRAMUSCULAR | Status: AC
Start: 2016-07-15 — End: 2016-07-15
  Administered 2016-07-15 (×2): 20 mg via INTRAVENOUS
  Filled 2016-07-15: qty 2

## 2016-07-15 MED ORDER — HEPARIN 25,000 UNITS/250 ML 1/2 NS PREMIX INFUSION (~~LOC~~)
1850.0000 [IU]/h | INTRAMUSCULAR | Status: DC
Start: 2016-07-15 — End: 2016-07-15
  Administered 2016-07-15 (×3): 1850 [IU]/h via INTRAVENOUS
  Filled 2016-07-15: qty 250

## 2016-07-15 MED ORDER — LORAZEPAM 2 MG/ML IJ SOLN
4.0000 mg | INTRAMUSCULAR | Status: DC
Start: 2016-07-15 — End: 2016-07-19
  Administered 2016-07-18 – 2016-07-19 (×5): 4 mg via INTRAVENOUS
  Filled 2016-07-15 (×3): qty 2

## 2016-07-15 NOTE — Consults (Signed)
Date of Service:  07/15/2016    Service Provided:   Infectious Disease Consultation    Referring Physician: Wyn Forster, MD    Referring Service: Vascular surgery/SICU    Reason for Consultation: C diff infection    HPI:    39/M with hx of traumatic LLE injury 3 yrs ago with claudication s/p /u at [facility] then referred to [facility],  who was admitted on 07/06/16 for angiogram andleft popliteal-distal posterior tibial bypass with LLE GSV harvest; postop transferred to ICU intubated, on heparin drip, however LLE plses not dopplerable and taken to OR on 12/2 and found to have post-op thrombosis of left below-knee popliteal-posterior tibial reverse saphenous vein bypass and underwent re-do bypass; he was extubated on 12/4; patient received periop antibiotics on 12/5 (Cefazolin), however started to become febrile and also with increasing leukocytosis; by 12/7 becamse hypotensive, more tachycardic, worsening leukocytosis, and patient was intubated and started on empiric CDI treatment with IV Flagyl and PO Vancomycin; C diff toxin came back positive as well; Patient's fever curve started to decrease, though leukocytosis continued to trend up to 26K on 12/9; because of this Vancomycin enema was started on 12/9; On the next day, leukocytosis of 23, patient afebrile, hemodynamically stable not on pressors, though still intubated and still with large volume diarrhea 1-2L. ID consulted today for further recommendations on treatment of severe C.diff infection.      Currently intubated.      Past Medical History:   Diagnosis Date    Alcohol withdrawal delirium, acute, hyperactive (CMS-HCC)     Alcoholism /alcohol abuse (CMS-HCC)     Clostridium difficile colitis        No past surgical history on file.    Family History:  Noncontributory for infection.    Social History     Social History    Marital status: Married     Spouse name: N/A    Number of children: N/A    Years of education: N/A     Occupational History     Not on file.     Social History Main Topics    Smoking status: Former Smoker    Smokeless tobacco: Former Systems developer    Alcohol use Not on file    Drug use: Not on file    Sexual activity: Not on file     Other Topics Concern    Not on file     Social History Narrative       No known allergies [other]    No current facility-administered medications on file prior to encounter.      Current Outpatient Prescriptions on File Prior to Encounter   Medication Sig Dispense Refill    ASPIRIN 81 PO Take 81 mg by mouth.      gabapentin (NEURONTIN) 300 MG capsule Take 300 mg by mouth 3 times daily.      pentoxifylline (TRENTAL) 400 MG Controlled-Release tablet Take 400 mg by mouth.           Review of Systems:  Intubated/sedated unable to obtain    Physical Exam:   07/15/16  1100 07/15/16  1121 07/15/16  1200 07/15/16  1325   BP: 100/55  111/58 104/54   Pulse: 96 95 95 94   Resp: _0 Temp: 96.4 F (35.8 C)  95.9 F (35.5 C) 96.1 F (35.6 C)   SpO2: 100% 100% 100% 100%       General:intubated sedated  HEENT: NCAT, PERRL, EOMI  Neck:  supple, no lymphadenopathy,   Lungs: CTAB, no wheezes/rales/rhonchi  Heart: RRR, S1, S2, No M/R/G  Abdomen: Soft, hypoactive bowel sounds, distended, but no guarding; rectal tube in place with green entirely liquid stool  Extremities: no edema, RUE PICC  Neurological: intubated/sedated  Skin: No Rash      WBC/HGB/HCT/PLT:  23.7/6.7/19.9/280 (12/10 0756)  BMP:  142/3.3/109/--/6/0.9/112 (12/10 0756)  Lab Results   Component Value Date    AST 45 07/14/2016    ALT 23 07/14/2016    ALK 58 07/14/2016    ALB 2.6 07/14/2016    TBILI 0.5 07/14/2016    DBILI 0.1 07/12/2016           Assessment and plan    39/M with hx of traumatic LLE injury 3 yrs ago with claudication s/p /u at [facility] who was admitted to [facility] on 07/06/16 for bypass procedure, needed re-bypass on 12/2, then on 12/7 found to have C diff colitis and started  on treatment. Because of persistence of symptoms ID was consulted for further recommendations.    Severe C. Diff infection  - C diff toxin positive on 12/7, started on PO Vanco and IV Flagyl on 12/7, remains to have diarrhea, but overall fever has resolved, hemodynamically stable, still with leukocytosis; there is no indication to add more antibiotics nor to add enema as there is no evidence of ileus     Recommendations:  - continue PO Vancomycin 523m q6 hours, and IV Flagyl 5040mq8hours  - can dc the Vancomycin enema as there is no evidence of ileus  - continue supportive measures (fluids, replacement of electrolytes)  - daily abdominal exam/KUB to monitor   - avoid addition of broad-spectrum antibiotics as this may impede improvement of CDI  - will follow    Patient and plan discussed with ID attending Dr. HsDonald SivaDiscussed with primary team        Date of Attending Evaluation:  07/15/2016    I saw and examined the patient independently, and discussed the case with the Fellow.   I agree with the final findings and plan as documented in the record.   We formulated the assessment and plan together.  Any additions or revisions are included in the record.    [age] M admitted for vascular LLE bypass procedure.  Course complicated by C. Diff colitis.  On exam, pt is intubated, abd distended but soft.  Recommend continuing oral vanco and IV flagyl.  Ok to DC vanco enema since no evidence of ileus.    LaBess HarvestMD

## 2016-07-15 NOTE — Plan of Care (Signed)
Problem: Promotion of health and safety  Goal: Promotion of Health and Safety  The patient remains safe, receives appropriate treatment and achieves optimal outcomes (physically, psychosocially, and spiritually) within the limitations of the disease process by discharge.   Outcome: Progressing toward goal, anticipate improvement over: next 12-24 hours  Nursing Shift Summary    No data found.    No data found.      No data found.    No data found.

## 2016-07-15 NOTE — Progress Notes (Signed)
Progress Note                Surgery-Vascular    Patient Name: Jerome Hunt  MRN: [id number]  Room#: 6232/6232-01    Hospital Day:   9 days - Admitted on: 07/06/2016 Post-Op Day: 9 Service: Surgery-Vascular      SUBJECTIVE: No acute events overnight. Tmax 100.8. Continues to have watery diarrhea. Intubated and sedated.     OBJECTIVE:    Pain score: 0    Vital Signs:     Latest Entry  Range (last 24 hours)    Temperature: 97.2 F (36.2 C)  Temp  Avg: 97.7 F (36.5 C)  Min: 95.9 F (35.5 C)  Max: 100.2 F (37.9 C)    Blood pressure (BP): 96/55  BP  Min: 89/53  Max: 126/63    Heart Rate: 96  Pulse  Avg: 103.5  Min: 91  Max: 122    Respirations: 14  Resp  Avg: 13.3  Min: 10  Max: 19    SpO2: 100 %  SpO2  Avg: 100 %  Min: 99 %  Max: 100 %     Weight: 101.4 kg (223 lb 8.7 oz)  Percentage Weight Change (%): 1.4 %    12/09 0600 - 12/10 0559  In: 5050.8 [I.V.:4730.8]  Out: 5643 [Urine:2055]  Bowel movement: flexiseal in place      Intake/Output Summary (Last 24 hours) at 07/15/16 2057  Last data filed at 07/15/16 1500   Gross per 24 hour   Intake          1959.25 ml   Output             4430 ml   Net         -2470.75 ml           Physical:    General Appearance: sedated, intubated  Heart:  Sinus tachycardia .  Lungs: bilateral breath sounds present. ETT in place .  Abdomen: soft, distended. nonperitoneal   Extremities:  RLE: dopplerable PT and DP signals present  LLE: wound without erythema. Intact. Dressing in place. Dopplerable AT signal.     Glasgow Coma Scale Score: 10    Labs:     CBC  Recent Labs      07/15/16   0756  07/15/16   1908   HGB  6.7*  8.0*   HCT  19.9*  24.8*        Chemistry  Recent Labs      07/13/16   2001  07/14/16   0420  07/14/16   0617   07/15/16   0347  07/15/16   0359  07/15/16   0756   K  4.1  3.8   --    --    --    --   3.3*   CL  110*  109*   --    --    --    --   109*   BICARB   --    --   25.3   --   29.2*   --    --    BUN  5*  5*   --    --    --    --   6*    CREAT  0.9  0.9   --    --    --    --   0.9   GLU  159*  140*   --    --    --    --  112   [place]  7.5*  7.6*   --    --    --    --   8.0*   MG  2.4  2.2   --    < >   --   2.0  2.0   PHOS  1.2*   --    --    --    --    --   2.6    < > = values in this interval not displayed.     Recent Labs      07/13/16   2001  07/14/16   0420   ALK  60  58   AST  51*  45*   ALT  24  23   TBILI  0.5  0.5   ALB  2.6*  2.6*          Coags  Recent Labs      07/14/16   2306  07/15/16   1208   PTT  80.7*  97.6*       Cultures:  C. Diff positive      Radiology:   CXR 07/15/2016  Impression: Stable support tubes and catheter. Persistent low volume lungs with mild pulmonary vascular congestion and trace pleural effusion. No significant pneumothorax. Stable cardiomediastinal silhouette and osseous structures.    Medications:  Scheduled Meds   acetaminophen  975 mg Q8H    aspirin  81 mg Daily    chlorhexidine  30 mL BID    folic acid  1 mg Daily    influenza vaccine >=3 yrs  0.5 mL Prior to discharge    LORazepam  4 mg Q4H    metoprolol  2.5 mg Q6H    metronidazole (FLAGYL) IVPB  500 mg Q8H NR    pantoprazole  40 mg Daily    QUEtiapine  150 mg Q8H    thiamine  100 mg Daily    vancomycin 500 mg in NS 100 mL rectal enema  500 mg Q6H NR    vancomycin  250 mg 4x Daily     PRN Meds   fentaNYL  25 mcg Q5 Min PRN    fentaNYL  25 mcg Q2H PRN    LORazepam  1 mg Q5 Min PRN    LORazepam  1 mg Q2H PRN    LORazepam  1.5 mg Q30 Min PRN    Or    LORazepam  3 mg Q30 Min PRN    ondansetron  4 mg Q6H PRN    propofol  30 mg Q3 Min PRN    propofol  30 mg Q2H PRN    sodium chloride (PF)  10 mL PRN    sodium chloride (PF)  20 mL PRN    sodium chloride (PF)  50 mL Once PRN    sodium chloride   Once PRN    sodium chloride   Once PRN       ASSESSMENT / PLAN:  40 year old male admitted with 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 Patient  underwent attempted Popliteal-posterior tibial artery bypass on  12/1 with resulting graft failure and reoperation on 12/2 and underwent Popliteal to peroneal bypass with jump graft from peroneal artery to posterior tibial artery - this graft has subsequently gone down as well.     Postoperatively he developed acute alcohol withdrawal due to his undisclosed alcohol abuse.  Due to his clinical decline and the  need for further imaging studies he was intubated and sedated which has been controlling his agitation fairly well.     He currently has a active c diff infection without signs toxic megacolon. WBC downtrending, Afeb overnight      Neuro:  Ativan and fentanyl sedation  CV:  Tachycardic due to infection and alcoholic withdrawal, improving  Pulm: acute respiratory failure intubated and sedated minimal vent settings wean vent as tolerated extubation per critical care team when safe  GI:  Keo feed in place active C diff colitis, not feeding  Renal:  Normal creatinine continue Foley catheter for critical illness and close urine monitoring rhabdomyolysis has resolved  Heme: acute blood loss anemia -hemoglobin 7.3 today likely some element of dilutional component given no active signs of bleeding, transfuse 1 unit pRBC this AM, continue heparin drip  ID:  C diff colitis -continue vancomycin and Flagyl, afeb, wbc down-trending. May need to consider colonic lavage if acutely decompensates will need emergent colectomy  Endocrine: ISS  Disposition critical care          No major events overnight  WBC count improving  Less diarrhea  Wean vent per ICU team  Pain mgmt  Sedation  DVT prophlaxis

## 2016-07-15 NOTE — Progress Notes (Signed)
Heparin per Pharmacy Protocol - Progress Note    40 year old male continues on heparin anticoagulation for lower extremity ischemia.  -- aPTT Goal:   65 - 90 seconds  --admitted: 12/1 for LLE pain; now s/p bypass  --history of LLE injury 3 years ago; also EtOH, tob  --no oral anticoagulants prior to admission  --190 cm  --100 kg    Heparin Rates and aPTT Results  1950 units/hr  aPTT  --80.3 12/9 04:20  --80.6 12/9 10:20  --97.6 12/9 12:08    Labs  WBC/HGB/HCT/PLT:  23.7/6.7/19.9/280 (12/10 0756)    Assessment  --aPTT has been largely stable within goal  on heparin infusion rate approximately 21 units/kg/hr; most recent value above goal  --No bleeding observed by nurse  --CBC is below normal limits and has decreased from yesterday; will contact primary team      Plan  --Continue heparin protocol to maintain goal 65 - 90 seconds  --Continue aPTT monitoring every 6 hours for now

## 2016-07-15 NOTE — Plan of Care (Incomplete)
Problem: Promotion of health and safety  Goal: Promotion of Health and Safety  The patient remains safe, receives appropriate treatment and achieves optimal outcomes (physically, psychosocially, and spiritually) within the limitations of the disease process by discharge.    07/13/16 1919 07/15/16 1958   Adult/Peds Plan of Care   Standard of Care/Policy --  Critical Care SOC;Restraints SOC;Falls reduction;Skin SOC   Outcome Evaluation (rationale for progressing/not progessing --  vital signs stable   Patient Specific Goal, Promotion of Health & Safety --  Vanco mycin enemas via flexiseal.    Additional Individualized Interventions/Recommendations  --  Good doppler pulses BLE   Additional Individualized Interventions/Recommendations q1 neuro checks, q1 vascular checks --      Nursing Shift Summary    Provider Notification for the past 12 hrs:   Provider Name Reason for Communication   07/15/16 1000 Dr. Cyndra NumbersBernie hgb 6.7       Critical Value Notification for the past 12 hrs:   Lab Test Name Test Result Provider Name Comments   07/15/16 1000 Hemoglobin 6.7 Dr. Cyndra NumbersBernie -         Rounding for the past 12 hrs:   Physician Rounding   07/15/16 0930 My nursing colleague or I joined the physician during  his/her interaction/rounds with the patient at least once during my shift       Shift Comments for the past 12 hrs:   Comments   07/15/16 0900 Dr. Williemae AreaKubutey and Dr. Tonna CornerWhealon rounded on the patient.  Examined the patient at bedside.   07/15/16 0930 SICU rounds.  Plan titraite down ativan add propofol, so extubation will be possible in the near future.  Add vancomycin oral back to the regimen.     07/15/16 1000 Hgb 6.7 sicu resident [facility] notified.   07/15/16 1300 start blood 1 unit RBC.  Restart heparin it was stopped 30 mins for PTT of 97 restarting decreased rate.

## 2016-07-15 NOTE — Progress Notes (Addendum)
Progress Note  Surgical Intensive Care Unit    Patient Name: Jerome Hunt  MRN: [id number]     Admitting Service: [place] Attending Provider: Rae Halsted    Mechanism of Injury: <principal problem not specified>    Date: 07/15/16  Hospital Day:   9 days - Admitted on: 07/06/2016    Injuries/Problems: Problem List:  2017-12: Peripheral vascular disease (CMS-HCC)  2017-11: Peripheral artery disease (CMS-HCC)  Clostridium difficile colitis  Alcoholism /alcohol abuse (CMS-HCC)  Alcohol withdrawal delirium, acute, hyperactive (CMS-HCC)      OBJECTIVE:   Physical Exam:  intermiten tachy  No resp distress  abd soft ND  L groin pico in place; incision on LLE in tact; foot warm; cannot palpate pulse over graft; doppler AT  R foot with doppler DP    Vitals signs:     Latest Entry  Range (last 24 hours)    Temperature: 98.2 F (36.8 C)  Temp  Avg: 99.9 F (37.7 C)  Min: 97.9 F (36.6 C)  Max: 100.8 F (38.2 C)    Blood pressure (BP): 124/62  BP  Min: 89/53  Max: 143/74    Heart Rate: 106  Pulse  Avg: 119.1  Min: 103  Max: 132    Respirations: 14  Resp  Avg: 16.4  Min: 10  Max: 29    SpO2: 100 %  SpO2  Avg: 100 %  Min: 99 %  Max: 100 %       No Data Recorded       No Data Recorded       No Data Recorded     Weight: 100 kg (220 lb 7.4 oz)  Percentage Weight Change (%): 5.26 %    I&O:  Intake/Output       07/14/16 0600 - 07/15/16 0559 07/15/16 0600 - 07/16/16 0559      0600-1759 3149-7026 Total 0600-1759 3785-8850 Total       Intake    I.V.  2477.5  1529.3 4006.8  --  -- --    NG/GT  160  100 260  --  -- --    Total Intake 2637.5 1629.3 4266.8 -- -- --       Output    Urine  935  1120 2055  --  -- --    Stool  1100  1400 2500  --  -- --    Total Output 2035 2520 4555 -- -- --       Net I/O     602.5 -890.8 -288.3 -- -- --            Labs:  CBC  Recent Labs      07/13/16   0302  07/14/16   0420   WBCCOUNT  23.1*  26.8*   HGB  7.7*  7.3*   PLCTEL  192  223   BANDP  3.8  41.5   LYMPP2  12.5  14.2   MONP2  3.9  6.6                 Chemistry  Recent Labs      07/13/16   0302  07/13/16   2001  07/14/16   0420  07/14/16   2010   SODIUM  139  139  139   --    K  3.9  4.1  3.8   --    CL  112*  110*  109*   --    CO2  20*  25  25   --  BUN  8  5*  5*   --    CREAT  1.0  0.9  0.9   --    GLU  142*  159*  140*   --    CA  7.2*  7.5*  7.6*   --    MG  1.7*  2.4  2.2  2.1   PHOS  2.5  1.2*   --    --      Recent Labs      07/12/16   1305   07/13/16   2001  07/14/16   0420   ALK  98   < >  60  58   AST  RESULT IS INVALID BECAUSE HEMOLYSIS INDEX EXCEEDED ALLOWABLE LIMITS.   < >  51*  45*   ALT  31   < >  24  23   TBILI  RESULT IS INVALID BECAUSE HEMOLYSIS INDEX EXCEEDED ALLOWABLE LIMITS.   < >  0.5  0.5   DBILI  0.1   --    --    --    ALB  3.7*   < >  2.6*  2.6*    < > = values in this interval not displayed.       Coags  Recent Labs      07/14/16   1020  07/14/16   2306   PTT  80.6*  80.7*       Medications:  Scheduled Meds  . acetaminophen  975 mg Q8H   . aspirin  81 mg Daily   . chlordiazePOXIDE  75 mg Q8H   . chlorhexidine  30 mL BID   . folic acid  1 mg Daily   . influenza vaccine >=3 yrs  0.5 mL Prior to discharge   . metoprolol  2.5 mg Q6H   . metronidazole (FLAGYL) IVPB  500 mg Q8H NR   . pantoprazole  40 mg Daily   . QUEtiapine  150 mg Q8H   . thiamine  100 mg Daily   . vancomycin 500 mg in NS 100 mL rectal enema  500 mg Q6H NR     PRN Meds  . fentaNYL  25 mcg Q5 Min PRN   . fentaNYL  25 mcg Q2H PRN   . LORazepam  1 mg Q5 Min PRN   . LORazepam  1 mg Q2H PRN   . LORazepam  1.5 mg Q30 Min PRN    Or   . LORazepam  3 mg Q30 Min PRN   . ondansetron  4 mg Q6H PRN   . propofol  30 mg Q3 Min PRN   . propofol  30 mg Q2H PRN   . sodium chloride (PF)  10 mL PRN   . sodium chloride (PF)  20 mL PRN   . sodium chloride (PF)  50 mL Once PRN     IV Meds  . fentaNYL 175 mcg/hr (07/15/16 0148)   . heparin (porcine) 1,950 Units/hr (07/15/16 0203)   . lactated ringers 150 mL/hr at 07/15/16 0119   . LORazepam 8 mg/hr (07/15/16 0000)   . propofol Stopped  (07/13/16 0600)       Allergies:  Allergies   Allergen Reactions   . No Known Allergies [Other] Other     Received name: No Known Allergies       ASSESSMENT / PLAN / RECOMMENDATIONS:  40M with a traumatic left lower extremity injury 3 years prior to presents with pain to left lower  extremity concerning for claudication  S/p left popliteal to distal PT bypass  S/p Bypass revision, left popliteal to peroneal and peroneal to PT jump graft    07/06/16  LEFT POPLITEAL TO DISTAL POSTERIOR TIBIAL ARTERY BYPASS LEFT SAPHENOUS VEIN HARVEST AORTOGRAM LEFT LEG ANGIOGRAM LEFT POPLITEAL ARTERY ANGIOPLASTY (4X4, 5X4) LEFT DISTAL POSTERIOR TIBIAL ARTERY ANGIOPLASTY       07/07/16  Redo Left transinterosseous below-knee popliteal-peroneal and sequential peroneal-distal posterior tibal-medial tarsal reverse saphenous vein bypass; Left distal segmental fibula resection to expose distal peroneal artery,  Thrombectomy of left below-knee popliteal-posterior tibial reverse saphenous vein bypass    Neuro:   GCS 10T  - on ativan 9 and fentanyl 175 gtt. Start propofol gtt and wean off ativan  - CIWA for h/o etoh Abuse and withdrawal ativan scale increased  -librium 75 q8hrs,seroquel 153m q8hrs  -aspirin, gabapentin  -tylenol prn, ativan prn  Thiamine folate  -Head CT 12/7 Negative    CV:BP  Min: 89/53  Max: 143/74  Pulse  Avg: 119.1  Min: 103  Max: 132  120s-130s/70s  HR 100s  - metop 57mq6hrs     #LLE claudication s/p bypass   - bypass likely thrombosed again  - arterial duplex 07/08/16: There is distal arterial flow noted in the posterior tibial artery. The bypass graft is not visualized.Mild stenosis at the left mid-distal SFA.  - ABI with AT on LLE is 0.63 07/08/16  - q1 neurovscular checks  - on heparin gtt 1950  - pulse ox monitor on LLE (currently at 100%)   -DVT Bilt LE + UE [place] 07/12/16  -CTA runoff 07/12/16 shows Nonopacified left popliteal to distal posterior tibial artery bypass, suggesting graft  failure.    Pulmonary:  Intubated AC/VC 12/500/40%/5   Arterial Blood Gas result:  pH 7.41;  PCO2 47,  pO2 140, HCO3 29, %O2 Sat 99.  CT chest 07/12/16 shows no findings              FEN/GI:  KUB shows worsening colonic illeus. CT abdomen 07/12/16 shows pan colitis.  D5NS _0 /hr   protonix, Replete electrolytes  Diet:NPO Reason: Surgery/Procedure; Tube Feed: Hold while NPO (if ordered)  Tube Feed Adult Impact Peptide 1.5 Cal; Adult Tube Feed Route: Nasogastric Feeding Tube; Water Flush Volume: 25 mL; Water Flush Frequency: 4 hr; Continuous or Intermittent: Continuous; Initial rate (mL/hour): 20; Rate Increase (mL/hour): 20; Rate ...     Renal:   +4.7  -4.6 (2.5L stool)  =+5.5    Drain 20  folley  -f/u Cr 1.3 -> 1.0 -> 1.0    Hematology:   hb 10.6 -> 8.9 ->8.0 -> 7.0 -> 9.3 ->8.3 ->7.8 ->7.7 ->7.3  Hg 7.0 received 1 uPRBCs 12/5  -no DVT ppx on board  D/c'd central, placed PICC    ID: Temp  Avg: 99.9 F (37.7 C)  Min: 97.9 F (36.6 C)  Max: 100.8 F (38.2 C)  febrile WBC 17.6 -> 13.3 -> 18.8 -> 23.1 -> 26; with multiple foul loose stool; c. Diff colitis  Micro: BCx 12/1 NGTD, BC 12/5 NGTD  -UA 12/5 and 12/7 negative   -c.diff positive  -f/u BCx NGTD  -on vanco enema, oral vanco, iv flagyl  -f/u ID recs    Endocrine:  FSBS: < 180    MSK:  PT/OT:  No  Weight Bearing Status:  weight bear as tolerated. ABI 0.6. Xray shows right knee effusion  -d/c f/u CPK 4000 -> 5493 -> 4656 ->3747 -> 1746  -  f/u Kub for hip       ICU Care:    Feedings: yes  Analgesia: Yes  Sedation: Yes  Thromboprophylaxis:SCDs, early mobility protocol and subcutaneous heparin  Head-of-bed elevation: Yes  Ulcer prophylaxis: no  Glycemic control: no  GI Bowel care: Yes  Indwelling catheter removal: no  ID: de-escalation of Abx: yes  Spontaneous breathing trial: N/A    Family discussion: yes    DISPO: SICU    ATTENDING CRITICAL CARE PROGRESS NOTE AND ATTESTATION (COMPLETED BY Dorothyann Gibbs, MD )    Subjective      40 y/o male with prior traumatic  injury to leg and redo of lower extremity bypass graft.  Graft currently does not have flow.  Hospital course complicated by sever alcohol withdrawal and patient prolonged intubation    I have reviewed the history.  Overnight events:  Continued severe diarrhea and agitation improved       Objective    I have examined the patient and concur with the resident exam.    Assessment and Plan    Tysen Roesler remained in SICU overnight secondary to diagnoses discussed below and due to concern for possible sudden deterioration.    I agree with the resident care plan.  The patient will continue to require ICU level of care.   Today's recommendations: .     Neuro:Delirium tremens. Previously on precedex gtt. Now intubated on ativan GCS 10T , RASS +2 -> - 2 , CAM -  Ativan gtt/fentanyl. Seroquel and librium. Folate. Thiamine. MVI. Start around the clock Ativan and start propfol with plans to wean off ativan gtt for potential extubation  - add schedule PO ativan and wean drip  Cardio: HDS. Tachycardia improving. 103-107 on heparin gtt   Pulm: acute respiratory failure on mechanical ventilation 40%/5 peep. RSBI 11   GI: Severe cdiff colitis. On oral vanco and IV flagyl started 7th afternoon. Started rectal vanco enemas 12/9. Stool output 2.5 up from yesterday.  wbc 23-> 26. If not improving may need operative intervention. recommend trickle feeds 10 cc do not advance. KUB with no significant change. Continue daily KUB  Renal: 4.7/2.6 urine 2.1 previously on rhabdo protocol for rising CK now improving fluids decreased to 150. Plan to transition to D5.45 @ 100 monitor I/O if need to replace stool output   ID: cdiff colitis on oral + rectal vanco and IV flagyl   Heme: acute blood loss anemia hgb stable 6.7 -> 1 unit ordered   Endo: FSBS <180  proph: heparin gtt for graft.  Vasc: Popliteal-posterior tibial artery bypass on 12/1 with resulting graft failure and reoperation on 12/2 and underwent Popliteal to peroneal bypass with jump  graft from peroneal artery to posterior tibial artery - this graft has subsequently gone down as well. has collaterals and flow to the foot via AT   Lines/tubes/drains: PICC line foley aline       See the resident / fellow note for further details.     This patient was seen and examined by myself and the critical care service.  The patient's condition was discussed in a system based manor.  Changes to the patient care were updated based on the patient's condition and discussion of care goals on rounds with the team and were updated in note by the resident. Critical Care Time evaluating, managing, providing care and documenting critical care activities  = 35   minutes, during which I was immediately available to this patient. Time involved in the  performance of separately reportable procedures was not counted toward critical care time.     Patient met criteria for critical illness and ICU level of care during my evaluation and treatment due to the following diagnoses and/or management issues:  1. Ventilator management  2.  Altered mental status   3.  Severe alcohol with drawl and sedation   4.malnutrition   5.  .Acute kidney injury   6. Severe c. Diff colitis   7. rhabdomyolysis         Dorothyann Gibbs MD (A)  Associate Clinical Professor of Surgery   Division of Trauma Burn and Surgical Critical Care   Pager [phone]  Office [phone]  Burn Unit [phone] and [facility] [phone]

## 2016-07-16 ENCOUNTER — Inpatient Hospital Stay: Payer: No Typology Code available for payment source

## 2016-07-16 DIAGNOSIS — Z4659 Encounter for fitting and adjustment of other gastrointestinal appliance and device: Secondary | ICD-10-CM

## 2016-07-16 LAB — CBC WITH DIFF, BLOOD
Basophils %: 0 %
Basophils Absolute: 0 10*3/uL (ref 0.0–0.2)
Eosinophils %: 0 %
Eosinophils Absolute: 0 10*3/uL (ref 0.0–0.5)
Hematocrit: 25.1 % — ABNORMAL LOW (ref 39.5–50.0)
Hgb: 8.4 G/DL — ABNORMAL LOW (ref 13.5–16.9)
Lymphocytes %.: 7 %
Lymphocytes Absolute: 1.6 10*3/uL (ref 0.9–3.3)
MCH: 31.1 PG (ref 27.0–33.5)
MCHC: 33.4 G/DL (ref 32.0–35.5)
MCV: 93 FL (ref 81.5–97.0)
MPV: 9.5 FL (ref 7.2–11.7)
Monocytes %: 6 %
Monocytes Absolute: 1.4 10*3/uL — ABNORMAL HIGH (ref 0.0–0.8)
PLT Count: 351 10*3/uL (ref 150–400)
Platelet Morphology: NORMAL
RBC: 2.7 10*6/uL — ABNORMAL LOW (ref 3.70–5.00)
RDW-CV: 15.3 % — ABNORMAL HIGH (ref 11.6–14.4)
Seg Neutro % (M): 87 %
Seg Neutro Abs (M): 19.7 10*3/uL — ABNORMAL HIGH (ref 2.0–7.5)
White Bld Cell Count: 22.7 10*3/uL — ABNORMAL HIGH (ref 4.0–10.5)

## 2016-07-16 LAB — APTT, BLOOD
PTT: 51.3 s — ABNORMAL HIGH (ref 24.1–36.3)
PTT: 75.6 s — ABNORMAL HIGH (ref 24.1–36.3)
PTT: 69.4 s — ABNORMAL HIGH (ref 24.1–36.3)

## 2016-07-16 LAB — BASIC METABOLIC PANEL, BLOOD
BUN: 6 mg/dL — ABNORMAL LOW (ref 7–25)
CO2: 29 mmol/L (ref 21–31)
Calcium: 8 mg/dL — ABNORMAL LOW (ref 8.6–10.3)
Chloride: 108 mmol/L — ABNORMAL HIGH (ref 98–107)
Creat: 0.9 mg/dL (ref 0.7–1.3)
Electrolyte Balance: 5 mmol/L (ref 2–12)
Glucose: 123 mg/dL — ABNORMAL HIGH (ref 70–115)
Potassium: 3.4 mmol/L — ABNORMAL LOW (ref 3.5–5.1)
Sodium: 142 mmol/L (ref 136–145)
eGFR - high estimate: >60 (ref >59)
eGFR - low estimate: >60 (ref >59)

## 2016-07-16 LAB — ARTERIAL BLOOD GAS
Base Excess: 3.6 mmol/L
HCO3: 28.7 mmol/L — ABNORMAL HIGH (ref 21.0–27.0)
Inspired O2: 0.4
O2 Sat: 98.6 % (ref 95.0–99.0)
Patient Temp: 37.7 DEG C
pCO2 (Temp Adjusted): 48.3 MMHG — ABNORMAL HIGH (ref 36.0–42.0)
pH (Temp Adjusted): 7.4 (ref 7.38–7.42)
pO2 (Temp Adjusted): 113.6 MMHG — ABNORMAL HIGH (ref 80.0–104.0)

## 2016-07-16 LAB — PHOSPHORUS, BLOOD: Phosphorus: 3.4 MG/DL (ref 2.5–5.0)

## 2016-07-16 LAB — MAGNESIUM, BLOOD: Magnesium: 1.9 mg/dL (ref 1.9–2.7)

## 2016-07-16 MED ORDER — POTASSIUM ACETATE 2 MEQ/ML IV SOLN
20.00 meq | Freq: Once | INTRAVENOUS | Status: AC
Start: 2016-07-16 — End: 2016-07-16
  Administered 2016-07-16 (×2): 20 meq via INTRAVENOUS
  Filled 2016-07-16: qty 10

## 2016-07-16 MED ORDER — VANCOMYCIN 50 MG/ML ORAL SOLN (COMPOUNDED) (~~LOC~~)
500.0000 mg | Freq: Four times a day (QID) | ORAL | Status: DC
Start: 2016-07-16 — End: 2016-07-25
  Administered 2016-07-16 – 2016-07-25 (×33): 500 mg via NASOGASTRIC
  Filled 2016-07-16 (×43): qty 10

## 2016-07-16 NOTE — Progress Notes (Signed)
Progress Note                Surgery-Vascular    Patient Name: Jerome Hunt  MRN: [id number]  Room#: 6232/6232-01    Hospital Day:   10 days - Admitted on: 07/06/2016 Post-Op Day: 9 Service: Surgery-Vascular      SUBJECTIVE: No acute events overnight. afebrile    OBJECTIVE:    Pain score: CPOT    Vital Signs:     Latest Entry  Range (last 24 hours)    Temperature: 100.4 F (38 C)  Temp  Avg: 99.2 F (37.3 C)  Min: 97.7 F (36.5 C)  Max: 100.4 F (38 C)    Blood pressure (BP): 138/77  BP  Min: 98/53  Max: 138/77    Heart Rate: 114  Pulse  Avg: 107.1  Min: 97  Max: 116    Respirations: 14  Resp  Avg: 14.6  Min: 9  Max: 29    SpO2: 100 %  SpO2  Avg: 99.7 %  Min: 96 %  Max: 100 %     Weight: 101 kg (222 lb 10.6 oz)  Percentage Weight Change (%): -0.39 %    12/10 0600 - 12/11 0559  In: 3553.5 [I.V.:2973.5]  Out: 4300 [Urine:2950]  Bowel movement: flexiseal in place      Intake/Output Summary (Last 24 hours) at 07/15/16 2057  Last data filed at 07/15/16 1500   Gross per 24 hour   Intake          1959.25 ml   Output             4430 ml   Net         -2470.75 ml           Physical:    General Appearance: sedated, intubated  Heart:  Sinus tachycardia .  Lungs: bilateral breath sounds present. ETT in place .  Abdomen: soft, distended. nonperitoneal   Extremities:  RLE: dopplerable PT and DP signals present  LLE: wound without erythema. Intact. Dressing in place. Dopplerable AT signal.     Glasgow Coma Scale Score: 8    Labs:     CBC  Recent Labs      07/15/16   1908  07/16/16   0520   HGB  8.0*  8.4*   HCT  24.8*  25.1*        Chemistry  Recent Labs      07/15/16   0347   07/15/16   0756  07/16/16   0438  07/16/16   0520   K   --    --   3.3*   --   3.4*   CL   --    --   109*   --   108*   BICARB  29.2*   --    --   28.7*   --    BUN   --    --   6*   --   6*   CREAT   --    --   0.9   --   0.9   GLU   --    --   112   --   123*   Carlos   --    --   8.0*   --   8.0*   MG   --    < >  2.0   --   1.9   PHOS   --    --  2.6   --    3.4    < > = values in this interval not displayed.     Recent Labs      07/14/16   0420   ALK  58   AST  45*   ALT  23   TBILI  0.5   ALB  2.6*          Coags  Recent Labs      07/16/16   0520  07/16/16   1252   PTT  51.3*  75.6*       Cultures:  C. Diff positive      Radiology:   CXR 07/15/2016  Impression: Stable support tubes and catheter. Persistent low volume lungs with mild pulmonary vascular congestion and trace pleural effusion. No significant pneumothorax. Stable cardiomediastinal silhouette and osseous structures.    Medications:  Scheduled Meds  . acetaminophen  975 mg Q8H   . aspirin  81 mg Daily   . chlorhexidine  30 mL BID   . folic acid  1 mg Daily   . influenza vaccine >=3 yrs  0.5 mL Prior to discharge   . LORazepam  4 mg Q4H   . metoprolol  2.5 mg Q6H   . metronidazole (FLAGYL) IVPB  500 mg Q8H NR   . pantoprazole  40 mg Daily   . QUEtiapine  150 mg Q8H   . thiamine  100 mg Daily   . vancomycin 500 mg in NS 100 mL rectal enema  500 mg Q6H NR   . vancomycin  500 mg Q6H NR     PRN Meds  . fentaNYL  25 mcg Q5 Min PRN   . fentaNYL  25 mcg Q2H PRN   . LORazepam  1 mg Q5 Min PRN   . LORazepam  1 mg Q2H PRN   . LORazepam  1.5 mg Q30 Min PRN    Or   . LORazepam  3 mg Q30 Min PRN   . ondansetron  4 mg Q6H PRN   . propofol  30 mg Q3 Min PRN   . propofol  30 mg Q2H PRN   . sodium chloride (PF)  10 mL PRN   . sodium chloride (PF)  20 mL PRN   . sodium chloride (PF)  50 mL Once PRN   . sodium chloride   Once PRN   . sodium chloride   Once PRN       ASSESSMENT / PLAN:  40 year old male admitted with 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 Patient underwent attempted Popliteal-posterior tibial artery bypass on  12/1 with resulting graft failure and reoperation on 12/2 and underwent Popliteal to peroneal bypass with jump graft from peroneal artery to posterior tibial artery - this graft has subsequently gone down as well.     Postoperatively he  developed acute alcohol withdrawal due to his undisclosed alcohol abuse.  Due to his clinical decline and the need for further imaging studies he was intubated and sedated which has been controlling his agitation fairly well.     He currently has a active c diff infection without signs toxic megacolon. WBC downtrending, Afeb overnight      Neuro:WEAN  Ativan and fentanyl sedation, uptitrate propofol with goal of extubation  CV:  Tachycardic due to infection and alcoholic withdrawal, improving  Pulm: acute respiratory failure intubated and sedated minimal vent settings wean vent as tolerated extubation per  critical care team when safe. Evaluate for extubation in AM  GI:  Keo feed in place active C diff colitis,FEEDS STARTED  Renal:  Normal creatinine continue Foley catheter for critical illness and close urine monitoring. rhabdomyolysis has resolved  Heme: acute blood loss anemia - some element of dilutional component given no active signs of bleeding, transfuse 1 unit pRBC YESTERDAY continue heparin drip  ID:  C diff colitis -continue vancomycin and Flagyl, afeb, wbc down-trending. May need to consider colonic lavage if acutely decompensates will need emergent colectomy  Endocrine: ISS  Disposition critical care    I attest that I had personally interviewed and examined Nakeem Murnane during this encounter on 07/16/2016 and have reviewed all of the available diagnostic studies along with the resident physician.  IGentry Fitz, MD, agree with the recorded history, physical examination and evaluation above and have supervised formulation of the stated impression, recommendations and care plan.    Gentry Fitz, MD  08/27/2019 8:41 PM

## 2016-07-16 NOTE — Plan of Care (Signed)
Problem: Promotion of health and safety  Goal: Promotion of Health and Safety  The patient remains safe, receives appropriate treatment and achieves optimal outcomes (physically, psychosocially, and spiritually) within the limitations of the disease process by discharge.   Outcome: Progressing toward goal, anticipate improvement over: >48 hours   07/16/16 1623   Adult/Peds Plan of Care   Standard of Care/Policy Critical Care SOC;[facility];Restraints SOC;Falls reduction   Outcome Evaluation (rationale for progressing/not progessing VSS. Ativan gtt weaned off. Remains on Fentanyl & Propofol.   Patient Specific Goal, Promotion of Health & Safety Continue Vasc/Doppler checks q1hr. Positive signals.   Additional Individualized Interventions/Recommendations  Vanco PO and PR for CDiff. Patient has flexiseal in place   Additional Individualized Interventions/Recommendations SBT q AM for possible extubation if appropriate.     Nursing Shift Summary    Rounding for the past 12 hrs:   Physician Rounding   07/16/16 0947 My nursing colleague or I joined the physician during  his/her interaction/rounds with the patient at least once during my shift       Shift Comments for the past 12 hrs:   Comments   07/16/16 0947 SICU team bedside rounds.

## 2016-07-16 NOTE — Progress Notes (Signed)
Progress Note  Surgical Intensive Care Unit    Patient Name: Jerome Hunt  MRN: [id number]     Admitting Service: [place] Attending Provider: Rae Halsted    Mechanism of Injury: <principal problem not specified>    Date: 07/16/16  Hospital Day:   10 days - Admitted on: 07/06/2016    Injuries/Problems: Problem List:  2017-12: Peripheral vascular disease (CMS-HCC)  2017-11: Peripheral artery disease (CMS-HCC)  Clostridium difficile colitis  Alcoholism /alcohol abuse (CMS-HCC)  Alcohol withdrawal delirium, acute, hyperactive (CMS-HCC)      OBJECTIVE:   Physical Exam:  intermiten tachy  No resp distress  abd soft ND  L groin pico in place; incision on LLE in tact; foot warm; cannot palpate pulse over graft; doppler AT  R foot with doppler DP    Vitals signs:     Latest Entry  Range (last 24 hours)    Temperature: 99.5 F (37.5 C)  Temp  Avg: 97.6 F (36.4 C)  Min: 95.9 F (35.5 C)  Max: 99.9 F (37.7 C)    Blood pressure (BP): 105/55  BP  Min: 96/55  Max: 126/74    Heart Rate: 107  Pulse  Avg: 101  Min: 91  Max: 110    Respirations: 9  Resp  Avg: 13.4  Min: 9  Max: 29    SpO2: 100 %  SpO2  Avg: 99.7 %  Min: 96 %  Max: 100 %       No Data Recorded       No Data Recorded       No Data Recorded     Weight: 101 kg (222 lb 10.6 oz)  Percentage Weight Change (%): -0.39 %    I&O:  Intake/Output       07/15/16 0600 - 07/16/16 0559 07/16/16 0600 - 07/17/16 0559      0600-1759 3664-4034 Total 0600-1759 7425-9563 Total       Intake    I.V.  --  2973.5 2973.5  264.7  -- 264.7    NG/GT  --  180 180  120  -- 120    PRBCs  400  -- 400  --  -- --    Total Intake 400 3153.5 3553.5 384.7 -- 384.7       Output    Urine  2180  770 2950  40  -- 40    Stool  550  800 1350  --  -- --    Total Output 2730 1570 4300 40 -- 40       Net I/O     -2330 1583.5 -746.6 344.7 -- 344.7            Labs:  CBC  Recent Labs      07/15/16   0359  07/15/16   0756  07/15/16   1908  07/16/16   0520   WBCCOUNT  24.6*  23.7*  22.4*  22.7*    HGB  6.7*  6.7*  8.0*  8.4*   PLCTEL  282  280  321  351   BANDP  19.8  25.7   --    --    LYMPP2  16.0  9.5   --    --    MONP2  10.4  5.7   --    --           Chemistry  Recent Labs      07/15/16   0756  07/16/16   0520   SODIUM  142  142   K  3.3*  3.4*   CL  109*  108*   CO2  29  29   BUN  6*  6*   CREAT  0.9  0.9   GLU  112  123*   Impact  8.0*  8.0*   MG  2.0  1.9   PHOS  2.6  3.4     Recent Labs      07/13/16   2001  07/14/16   0420   ALK  60  58   AST  51*  45*   ALT  24  23   TBILI  0.5  0.5   ALB  2.6*  2.6*       Coags  Recent Labs      07/15/16   2106  07/16/16   0520   PTT  113.7*  51.3*       Medications:  Scheduled Meds   acetaminophen  975 mg Q8H    aspirin  81 mg Daily    chlorhexidine  30 mL BID    folic acid  1 mg Daily    influenza vaccine >=3 yrs  0.5 mL Prior to discharge    LORazepam  4 mg Q4H    metoprolol  2.5 mg Q6H    metronidazole (FLAGYL) IVPB  500 mg Q8H NR    pantoprazole  40 mg Daily    potassium acetate IVPB  20 mEq Once    QUEtiapine  150 mg Q8H    thiamine  100 mg Daily    vancomycin 500 mg in NS 100 mL rectal enema  500 mg Q6H NR    vancomycin  250 mg 4x Daily     PRN Meds   fentaNYL  25 mcg Q5 Min PRN    fentaNYL  25 mcg Q2H PRN    LORazepam  1 mg Q5 Min PRN    LORazepam  1 mg Q2H PRN    LORazepam  1.5 mg Q30 Min PRN    Or    LORazepam  3 mg Q30 Min PRN    ondansetron  4 mg Q6H PRN    propofol  30 mg Q3 Min PRN    propofol  30 mg Q2H PRN    sodium chloride (PF)  10 mL PRN    sodium chloride (PF)  20 mL PRN    sodium chloride (PF)  50 mL Once PRN    sodium chloride   Once PRN    sodium chloride   Once PRN     IV Meds   dextrose-sodium chloride 5%-0.45% 100 mL/hr at 07/15/16 2357    fentaNYL 175 mcg/hr (07/15/16 2203)    heparin (porcine) 1,700 Units/hr (07/16/16 0704)    LORazepam 1 mg/hr (07/16/16 0628)    propofol 40 mcg/kg/min (07/16/16 0343)       Allergies:  Allergies   Allergen Reactions    No Known Allergies [Other] Other      Received name: No Known Allergies       ASSESSMENT / PLAN / RECOMMENDATIONS:  8M with a traumatic left lower extremity injury 3 years prior to presents with pain to left lower extremity concerning for claudication  S/p left popliteal to distal PT bypass  S/p Bypass revision, left popliteal to peroneal and peroneal to PT jump graft    07/06/16  LEFT POPLITEAL TO DISTAL POSTERIOR TIBIAL ARTERY BYPASS LEFT SAPHENOUS VEIN HARVEST AORTOGRAM LEFT LEG ANGIOGRAM LEFT POPLITEAL ARTERY ANGIOPLASTY (4X4, 5X4) LEFT  DISTAL POSTERIOR TIBIAL ARTERY ANGIOPLASTY       07/07/16  Redo Left transinterosseous below-knee popliteal-peroneal and sequential peroneal-distal posterior tibal-medial tarsal reverse saphenous vein bypass; Left distal segmental fibula resection to expose distal peroneal artery,  Thrombectomy of left below-knee popliteal-posterior tibial reverse saphenous vein bypass    Neuro:   GCS 10T  - off ativan and fentanyl 175 gtt.  propofol 30 and wean off ativan  - CIWA for h/o etoh Abuse and withdrawal ativan scale increased  -librium 75 q8hrs,seroquel 158m q8hrs  -aspirin, gabapentin  -tylenol schd, ativan 4623mq4hrs  Thiamine folate  -Head CT 12/7 Negative    CV:BP  Min: 96/55  Max: 126/74  Pulse  Avg: 101  Min: 91  Max: 110  100s-120s/50s-70s  HR 100s  - metop 2.23m59m6hrs     #LLE claudication s/p bypass   - bypass likely thrombosed again  - arterial duplex 07/08/16: There is distal arterial flow noted in the posterior tibial artery. The bypass graft is not visualized.Mild stenosis at the left mid-distal SFA.  - ABI with AT on LLE is 0.63 07/08/16  - q1 neurovscular checks  - on heparin gtt 1700  - pulse ox monitor on LLE (currently at 100%)   -DVT Bilt LE + UE US Koreagative 07/12/16  -CTA runoff 07/12/16 shows Nonopacified left popliteal to distal posterior tibial artery bypass, suggesting graft failure.    Pulmonary:  Intubated AC/VC 12/500/40%/5   cpap this am did well on it   Arterial Blood Gas result:  pH 7.4;  PCO2 48,  pO2 113, HCO3 28, %O2 Sat 99.  CT chest 07/12/16 shows no findings              FEN/GI:  KUB shows worsening colonic illeus. CT abdomen 07/12/16 shows pan colitis.  D5NS _0 /hr   protonix, Replete electrolytes K3.4  Start Tube feeds    Diet:NPO Reason: Surgery/Procedure; Tube Feed: Hold while NPO (if ordered)  Tube Feed Adult Impact Peptide 1.5 Cal; Adult Tube Feed Route: Nasogastric Feeding Tube; Water Flush Volume: 25 mL; Water Flush Frequency: 4 hr; Continuous or Intermittent: Continuous; Initial rate (mL/hour): 20; Rate Increase (mL/hour): 20; Rate ...     Renal:     Intake/Output Summary (Last 24 hours) at 07/16/16 0817  Last data filed at 07/16/16 0600   Gross per 24 hour   Intake           3938.1 ml   Output             4130 ml   Net           -191.9 ml     Stool 1.3L  Lasix 20 12/10  folley  -f/u Cr 1.3 -> 1.0 -> 1.0 ->0.9    Hematology: received 1 u PRBC   hb 10.6 -> 8.9 ->8.0 -> 7.0 -> 9.3 ->8.3 ->7.8 ->7.7 ->7.3->6.7 -> 1u ->8.0 ->8.4  Hg 7.0 received 1 uPRBCs 12/5, 12/10  -no DVT ppx on board  D/c'd central, placed PICC    ID: Temp  Avg: 97.6 F (36.4 C)  Min: 95.9 F (35.5 C)  Max: 99.9 F (37.7 C)  afebrile WBC 17.6 -> 13.3 -> 18.8 -> 23.1 -> 26 ->22; with multiple foul loose stool; c. Diff colitis  Micro: BCx 12/1 NGTD, BC 12/5 NGTD  -UA 12/5 and 12/7 negative   -c.diff positive  -f/u BCx NGTD  -on vanco enema, oral vanco, iv flagyl  -f/u ID recs  - continue PO  Vancomycin 537m q6 hours, and IV Flagyl 5084mq8hours  - can dc the Vancomycin enema as there is no evidence of ileus  - continue supportive measures (fluids, replacement of electrolytes)  - daily abdominal exam/KUB to monitor   - avoid addition of broad-spectrum antibiotics as this may impede improvement of CDI    Endocrine:  FSBS: < 180    MSK:  PT/OT:  No  Weight Bearing Status:  weight bear as tolerated. ABI 0.6. Xray shows right knee effusion   -d/c f/u CPK 4000 -> 5493 -> 4656 ->3747 -> 1746  -f/u Kub for hip       ICU Care:    Feedings: yes  Analgesia: Yes  Sedation: Yes  Thromboprophylaxis:SCDs, early mobility protocol and subcutaneous heparin  Head-of-bed elevation: Yes  Ulcer prophylaxis: no  Glycemic control: no  GI Bowel care: Yes  Indwelling catheter removal: no  ID: de-escalation of Abx: yes  Spontaneous breathing trial: N/A    Family discussion: yes    DISPO: SICU    C.diff colitis, acute respiratory failure, sepsis, DT,   S/p bypass 12/1  Neuro:Delirium tremens. Previously on precedex gtt. Now intubated on ativan gtt weaned from 9/hr to 1 hr-> plan to dc today and continue librium then will wean librium starting tomorrow   GCS 10T, RASS +2 -> - 2, CAM -  Seroquel . Folate. Thiamine. MVI. Gaba and tylenol.  Cardio: HDS. Tachycardia improving. 91-110 on heparin gtt for graft (most recent PTT 51)  Pulm: acute respiratory failure on mechanical ventilation 40%/5 peep. ABG 7.4/48/113/28/   RSBI 18 TV 650 NIF -30 once mental status stabilizes will extubate  GI: Severe cdiff colitis. On oral vanco (500) and IV flagyl started 7th afternoon. Started rectal vanco enemas 12/9. Stool output 2.5 down to 1.3  WBC slightly improved wbc 23-> 22.7 and afebrile and no guarding on exam this am. Recommend starting trickle feeds at 10 cc do not advance   Renal: 3.9/4.2 (-200) urine 2.8. IVF @ D5NS @ 100. previously on rhabdo protocol for rising CK now resolved  ID: cdiff colitis on oral + rectal vanco and IV flagyl. Oral vanco increased to 500 per ID + monitor abd exam.  Plan finish course of PO vanco/IV flagyl x14d  Heme: acute blood loss anemia hgb stable 8.4 from 8.0 (1 unit give yesterday)  Endo: FSBS <180 on  proph: heparin gtt for graft.  Vasc: Popliteal-posterior tibial artery bypass on 12/1 with resulting graft failure and reoperation on 12/2 and underwent Popliteal to peroneal bypass with jump graft from peroneal artery to posterior tibial artery -  this graft has subsequently gone down as well. has collaterals and flow to the foot via AT   Lines/tubes/drains:   PICC line   foley   aline     33 minutes of critical care    Attending Attestation:  I have seen, examined, and evaluated this patient including laboratory and radiographic data.  I have discussed the case and its management with the resident/team as documented in the resident/advanced practitioner/team note on the day of service.

## 2016-07-16 NOTE — Progress Notes (Signed)
Inpatient Heparin Drug Monitoring Note    Jerome Hunt is a [age] year old male on anticoagulation therapy for lower extremity ischemia.    Past Medical History:  Past Medical History:   Diagnosis Date    Alcohol withdrawal delirium, acute, hyperactive (CMS-HCC)     Alcoholism /alcohol abuse (CMS-HCC)     Clostridium difficile colitis        Allergies:  Allergies   Allergen Reactions    No Known Allergies [Other] Other     Received name: No Known Allergies       Medications:    Current Inpatient Medications:   acetaminophen  975 mg Q8H    aspirin  81 mg Daily    chlorhexidine  30 mL BID    folic acid  1 mg Daily    influenza vaccine >=3 yrs  0.5 mL Prior to discharge    LORazepam  4 mg Q4H    metoprolol  2.5 mg Q6H    metronidazole (FLAGYL) IVPB  500 mg Q8H NR    pantoprazole  40 mg Daily    potassium acetate IVPB  20 mEq Once    QUEtiapine  150 mg Q8H    thiamine  100 mg Daily    vancomycin 500 mg in NS 100 mL rectal enema  500 mg Q6H NR    vancomycin  250 mg 4x Daily      dextrose-sodium chloride 5%-0.45% 100 mL/hr at 07/15/16 2357    fentaNYL 175 mcg/hr (07/15/16 2203)    heparin (porcine) 1,700 Units/hr (07/16/16 0704)    LORazepam 1 mg/hr (07/16/16 0628)    propofol 40 mcg/kg/min (07/16/16 0343)      fentaNYL  25 mcg Q5 Min PRN    fentaNYL  25 mcg Q2H PRN    LORazepam  1 mg Q5 Min PRN    LORazepam  1 mg Q2H PRN    LORazepam  1.5 mg Q30 Min PRN    Or    LORazepam  3 mg Q30 Min PRN    ondansetron  4 mg Q6H PRN    propofol  30 mg Q3 Min PRN    propofol  30 mg Q2H PRN    sodium chloride (PF)  10 mL PRN    sodium chloride (PF)  20 mL PRN    sodium chloride (PF)  50 mL Once PRN    sodium chloride   Once PRN    sodium chloride   Once PRN       aPTT Goal:   65 - 90 seconds    Dosing weight = 91 kg     Current Heparin Rate:     Dose (Units/hr) Heparin: 1700 Units/hr    Labs:    Coags:  Recent Labs      07/14/16   0003  07/14/16   0420  07/14/16   1020  07/14/16   2306  07/15/16    1208  07/15/16   2106  07/16/16   0520   PTT  73.6*  80.3*  80.6*  80.7*  97.6*  113.7*  51.3*       Labs:  (Last 3 days):  Recent Labs      07/14/16   0420   07/15/16   0756  07/15/16   1908  07/16/16   0520   HGB  7.3*   < >  6.7*  8.0*  8.4*   HCT  22.0*   < >  19.9*  24.8*  25.1*  CREAT  0.9   --   0.9   --   0.9    < > = values in this interval not displayed.       Labs (Last values):  Albumin   Date Value Ref Range Status   07/14/2016 2.6 4.2 - 5.5 G/DL Final     AST   Date Value Ref Range Status   07/14/2016 45 13 - 39 U/L Final     ALT   Date Value Ref Range Status   07/14/2016 23 7 - 52 U/L Final     Bilirubin, Total   Date Value Ref Range Status   07/14/2016 0.5 0.0 - 1.4 mg/dL Final         Drug/Disease Interactions and Assessments:    - Bleeding Assessment:  H/H is stable. Patient does not exhibit any signs or symptoms of bleeding (spoke to [employer]).     - Current Nutrition Assessment:   Recent changes in nutrition/diet? no  NPO Reason: Surgery/Procedure; Tube Feed: Hold while NPO (if ordered)  Tube Feed Adult Impact Peptide 1.5 Cal; Adult Tube Feed Route: Nasogastric Feeding Tube; Water Flush Volume: 25 mL; Water Flush Frequency: 4 hr; Continuous or Intermittent: Continuous; Initial rate (mL/hour): 20; Rate Increase (mL/hour): 20; Rate ...    Assessment/Plan:  - aPTT= 51.3 seconds is below therapeutic goal range at 1600 units/hour.  - Will increase heparin maintenance infusion to 1700 units/hour   - Check aPTT every 6 hours, to be drawn at 13:00  - Continue to monitor for signs and symptoms of bleeding  - Patient does not have IM medications ordered. If given use small needle and apply pressure to injection site for 2 minutes.    Pharmacy will continue to monitor closely. For questions please call [phone]    Frutoso SchatzKathleen Thai Ansel BongBuu Olanda Downie, [facility] GroupHARMD

## 2016-07-16 NOTE — Interdisciplinary (Signed)
NUTRITION NOTE    Patient Name:  Jerome Hunt  MRN: [id number]  Date of Birth: 10-04-1975  Age: 40 year old  Date of Admission:  07/06/2016    Service Date: July 16, 2016  Evaluation Type: Progress  Source of Referral: Routine F/U       Recommendations to Physician  When TF resumes increase to goal provided below.   If unable to resume TF, recommend initiation of TPN/IL. Recommendations provided below.    If PN is started and propofol still infusing, recommend to hold IL until propofol stopped.      Recommended TPN Day 1   Protein Type Day 1: Freamine  Dextrose (gm) Day 1: 200  Protein (gm) Day 1: 50  Intralipids (ml) Day 1: 250  Recommended TPN Total Kcals  Day 1: 1380  Recommended TPN Total Protein (gms) Day 1: 50    Recommended TPN Day 2   Protein Type Day 2: Freamine  Dextrose (gm) Day 2: 300  Protein (gm) Day 2: 100  Intralipids (ml) Day 2: 250  Recommended TPN Total Kcals Day 2: 1920  Recommended TPN Total Protein (gms) Day 2: 100    Recommended TPN Goal  Protein Type : Freamine  Dextrose (gm) : 400  Protein (gm) : 145  Intralipids (ml) : 250  Recommended TPN Total Kcals Goal: 2440  Recommended TPN Total Protein (gms) Goal : 145     Recommended Tube Feeding   Tube Feeding Formula Type: Impact Peptide 1.5 Cal  Formula (cal/ml): 1.5 cal/ml  Formula Protein (gms): 94  Feeding Type: continuous  Goal Rate (ml/hour): 65  Hours/day: 24  Amount (ml/day): 1560  Start Rate (ml/hour): 20  Rate Increase (ml) : 10  Rate Increase Frequency: 8hrs  Recommended Tube Feeding Total Kcals: 2340  Recommended Tube Feeding Total Protein (gms): 146.64    Jerome Hunt is a 40 year old male, admitted on [id number] with a diagnosis of has Peripheral artery disease (CMS-HCC); Peripheral vascular disease (CMS-HCC); Clostridium difficile colitis; Alcoholism /alcohol abuse (CMS-HCC); and Alcohol withdrawal delirium, acute, hyperactive (CMS-HCC) on his problem list..       Objective:  Nutrition Related Findings   Nutrition Subjective : Unable to interview;Not meeting > 75% of estimated nutrition needs (TF currently held)  Physical Findings: Intubated  Current Diet order: Impact Peptide 1.5 @ 38m/hr  Current Nutrition Regimen Provides: TF @ goal provides 2160kcal and 135g of protein  Bowel Function: Ileus  Lab Review: Glucose 87 mg/dl, potassium 2.9, phos 2.0, Ammonia 56, Mag 1.4  Medication Review: Vitamins/Minerals;Sedative (folic acid, thiamine, propofol @ 24.561mhr)    Lab Results   Component Value Date    CHOL 237 05/05/2016    LDLCALC 152 05/05/2016    TRIG 139 05/05/2016       I/O:   Intake/Output Summary (Last 24 hours) at 07/16/16 1011  Last data filed at 07/16/16 1000   Gross per 24 hour   Intake           4610.5 ml   Output             3460 ml   Net           1150.5 ml       Allergies: No known allergies [other]    Anthropometrics:  Ht Readings from Last 1 Encounters:   07/06/16 6' 3" (1.905 m)        Wt Readings from Last 1 Encounters:   07/15/16 101 kg (222 lb 10.6 oz)  Body mass index is 27.83 kg/(m^2).    Estimated Needs  Weight Used for Total Estimated Needs (kg): 99.5 kg  Min Total Caloric Estimated Needs (kcals/day): 2487.5  Max Total Caloric Estimated Needs (kcals/day): 2985  Min Total Protein Estimated Needs (g/day): 149.25  Max Total Protein Estimated Needs (g/day): 199    Nutrition Diagnosis   Nutrition Diagnosis 1: Increased Nutrient Needs  Related To: Increased nutrient needs due to catabolic illness  As Evidenced By: Conditions associated with medical Dx/Tx  Status: Ongoing  Nutrition Diagnosis 2: Inadequate Protein - Energy Intake  Related To: Inadequate tube feed order/rate  As Evidenced By: Documented nutrition intake  Status: Ongoing    Nutrition Monitoring and Evaluation  Nutrition Goals  Meet >75% of estimated needs: Not Progressing  Limit NPO < 5 days w/out support: New Goal  Nutritional needs met via alternative route: Not Progressing  Improve lab values: Progressing   Show improvement in skin integrity: Progressing    Follow up date: 07/20/16  Nutrition Risk Level: very high    Name: Marlana Latus, RD     Date: 07/16/2016    Time: 10:11 AM

## 2016-07-17 ENCOUNTER — Inpatient Hospital Stay: Payer: No Typology Code available for payment source

## 2016-07-17 DIAGNOSIS — J9690 Respiratory failure, unspecified, unspecified whether with hypoxia or hypercapnia: Secondary | ICD-10-CM

## 2016-07-17 DIAGNOSIS — A0472 Enterocolitis due to Clostridium difficile, not specified as recurrent: Secondary | ICD-10-CM

## 2016-07-17 LAB — CBC WITH DIFF, BLOOD
Atypical Lymphocytes %: 4.8 %
Atypical Lymphocytes Absolute: 1.1 10*3/uL — ABNORMAL HIGH (ref 0.0–0.5)
Bands % (M): 28.6 %
Bands Abs (M): 6.6 10*3/uL — ABNORMAL HIGH (ref 0.0–0.6)
Basophils %: 0 %
Basophils Absolute: 0 10*3/uL (ref 0.0–0.2)
Eosinophils %: 1.9 %
Eosinophils Absolute: 0.4 10*3/uL (ref 0.0–0.5)
Hematocrit: 24 % — ABNORMAL LOW (ref 39.5–50.0)
Hgb: 8 G/DL — ABNORMAL LOW (ref 13.5–16.9)
Lymphocytes %.: 14.3 %
Lymphocytes Absolute: 3.3 10*3/uL (ref 0.9–3.3)
MCH: 31.2 PG (ref 27.0–33.5)
MCHC: 33.2 G/DL (ref 32.0–35.5)
MCV: 93.8 FL (ref 81.5–97.0)
MPV: 9.4 FL (ref 7.2–11.7)
Metamyelocytes %: 3.8 %
Metamyelocytes Absolute: 0.9 10*3/uL — ABNORMAL HIGH
Monocytes %: 8.6 %
Monocytes Absolute: 2 10*3/uL — ABNORMAL HIGH (ref 0.0–0.8)
Myelocytes %: 0.9 %
Myelocytes Absolute: 0.2 10*3/uL — ABNORMAL HIGH
PLT Count: 385 10*3/uL (ref 150–400)
Platelet Morphology: NORMAL
RBC: 2.56 10*6/uL — ABNORMAL LOW (ref 3.70–5.00)
RDW-CV: 15.3 % — ABNORMAL HIGH (ref 11.6–14.4)
Seg Neutro % (M): 37.1 %
Seg Neutro Abs (M): 8.5 10*3/uL — ABNORMAL HIGH (ref 2.0–7.5)
White Bld Cell Count: 23 10*3/uL — ABNORMAL HIGH (ref 4.0–10.5)

## 2016-07-17 LAB — ARTERIAL BLOOD GAS
Base Excess: 5.5 mmol/L
HCO3: 31.2 mmol/L — ABNORMAL HIGH (ref 21.0–27.0)
Inspired O2: 0.4
O2 Sat: 98.8 % (ref 95.0–99.0)
Patient Temp: 37.8 DEG C
pCO2 (Temp Adjusted): 54.1 MMHG — ABNORMAL HIGH (ref 36.0–42.0)
pH (Temp Adjusted): 7.38 (ref 7.38–7.42)
pO2 (Temp Adjusted): 134.6 MMHG — ABNORMAL HIGH (ref 80.0–104.0)

## 2016-07-17 LAB — BASIC METABOLIC PANEL, BLOOD
BUN: 7 mg/dL (ref 7–25)
CO2: 28 mmol/L (ref 21–31)
Calcium: 8 mg/dL — ABNORMAL LOW (ref 8.6–10.3)
Chloride: 108 mmol/L — ABNORMAL HIGH (ref 98–107)
Creat: 0.7 mg/dL (ref 0.7–1.3)
Electrolyte Balance: 6 mmol/L (ref 2–12)
Glucose: 113 mg/dL (ref 70–115)
Potassium: 3.5 mmol/L (ref 3.5–5.1)
Sodium: 142 mmol/L (ref 136–145)
eGFR - high estimate: >60 (ref >59)
eGFR - low estimate: >60 (ref >59)

## 2016-07-17 LAB — BLOOD CULTURE: Culture Result: NO GROWTH

## 2016-07-17 LAB — PHOSPHORUS, BLOOD: Phosphorus: 2.9 MG/DL (ref 2.5–5.0)

## 2016-07-17 LAB — APTT, BLOOD
PTT: 71.7 s — ABNORMAL HIGH (ref 24.1–36.3)
PTT: 64.9 s — ABNORMAL HIGH (ref 24.1–36.3)

## 2016-07-17 LAB — MAGNESIUM, BLOOD: Magnesium: 1.8 mg/dL — ABNORMAL LOW (ref 1.9–2.7)

## 2016-07-17 MED ORDER — METOPROLOL TARTRATE 25 MG OR TABS
12.5000 mg | ORAL_TABLET | Freq: Two times a day (BID) | ORAL | Status: DC
Start: 2016-07-17 — End: 2016-07-18
  Administered 2016-07-17 – 2016-07-18 (×3): 12.5 mg via ORAL
  Filled 2016-07-17 (×4): qty 1

## 2016-07-17 MED ORDER — CHLORDIAZEPOXIDE HCL 25 MG OR CAPS
50.0000 mg | ORAL_CAPSULE | Freq: Three times a day (TID) | ORAL | Status: DC
Start: 2016-07-17 — End: 2016-07-18
  Administered 2016-07-17 – 2016-07-18 (×4): 50 mg via ORAL
  Filled 2016-07-17 (×3): qty 2

## 2016-07-17 MED ORDER — MAGNESIUM SULFATE 4 GM/50ML IV SOLN
4.00 g | Freq: Once | INTRAVENOUS | Status: AC
Start: 2016-07-17 — End: 2016-07-17
  Administered 2016-07-17 (×2): 4 g via INTRAVENOUS
  Filled 2016-07-17: qty 50

## 2016-07-17 MED ORDER — SODIUM CHLORIDE 0.9 % IV SOLN
INTRAVENOUS | Status: DC
Start: 2016-07-17 — End: 2016-07-21
  Administered 2016-07-17 – 2016-07-19 (×2): via INTRAVENOUS

## 2016-07-17 NOTE — Progress Notes (Signed)
INFECTIOUS DISEASE PROGRESS NOTE      Reason for Consult: Diarrhea      SUBJECTIVE  Patient intubated and sedated      Review of Systems    Review of Systems:  Other then noted in the subjective there are no new   Constitutional, Respiratory, Cardiovascular, Genitourinary, Gastrointestinal, Neurologic or Musculoskeletal       Current Facility-Administered Medications:     acetaminophen (TYLENOL) tablet 975 mg, 975 mg, Oral, Q8H, Loletta Specter, MD, 975 mg at 07/17/16 1427    aspirin chewable tablet 81 mg, 81 mg, Oral, Daily, Arman Bogus, MD, 81 mg at 07/17/16 5573    chlordiazePOXIDE (LIBRIUM) capsule 50 mg, 50 mg, Oral, Q8H, Alycia Rossetti, MD, 50 mg at 07/17/16 1427    chlorhexidine (PERIDEX) 0.12 % solution 30 mL, 30 mL, Mouth/Throat, BID, Tasia Catchings, Sabrina, MD, 30 mL at 07/17/16 1021    fentaNYL (SUBLIMAZE) 5,000 mcg/100 mL premix infusion, 0-300 mcg/hr, IntraVENOUS, Continuous, Alycia Rossetti, MD, Last Rate: 3 mL/hr at 07/17/16 0642, 150 mcg/hr at 07/17/16 0642    fentaNYL (SUBLIMAZE) dose from bag, 25 mcg, IntraVENOUS, Q5 Min PRN, Alycia Rossetti, MD    fentaNYL (SUBLIMAZE) dose from bag, 25 mcg, IntraVENOUS, Q2H PRN, Alycia Rossetti, MD, 25 mcg at 22/02/54 2706    folic acid (FOLVITE) tablet 1 mg, 1 mg, Oral, Daily, Loletta Specter, MD, 1 mg at 07/17/16 0921    heparin 25,000 units in 250 mL 0.45% NS PREMIX infusion, 1,700 Units/hr, IntraVENOUS, Continuous, Hollie Salk, MD, Last Rate: 17 mL/hr at 07/17/16 1117, 1,700 Units/hr at 07/17/16 1117    Heparin Per Pharmacy, , Other, As Directed, Hollie Salk, MD    influenza vaccine >=3 yrs injection 0.5 mL, 0.5 mL, IntraMUSCULAR, Prior to discharge, Rae Halsted, Nii-Kabu, MD    LORazepam (ATIVAN) 100 mg/100 mL infusion, 0-10 mg/hr, IntraVENOUS, Continuous, Andaya, Aileen L, NP, Stopped at 07/16/16 1200    lorazepam (ATIVAN) dose from bag, 1 mg, IntraVENOUS, Q5 Min PRN, Andaya, Aileen L, NP     lorazepam (ATIVAN) dose from bag, 1 mg, IntraVENOUS, Q2H PRN, Andaya, Aileen L, NP, 1 mg at 07/14/16 0235    LORazepam (ATIVAN) injection 1.5 mg, 1.5 mg, IntraVENOUS, Q30 Min PRN, 1.5 mg at 07/11/16 0230 **OR** LORazepam (ATIVAN) injection 3 mg, 3 mg, IntraVENOUS, Q30 Min PRN, Alycia Rossetti, MD, 3 mg at 07/12/16 2144    LORazepam (ATIVAN) injection 4 mg, 4 mg, IntraVENOUS, Q4H, Loletta Specter, MD    magnesium sulfate 4 GM/50ML IVPB 4 g, 4 g, IntraVENOUS, Once, Alycia Rossetti, MD, 4 g at 07/17/16 1403    metoprolol (LOPRESSOR) injection 2.5 mg, 2.5 mg, IntraVENOUS, Q6H, Alycia Rossetti, MD, 2.5 mg at 07/17/16 1226    metronidazole (FLAGYL) IVPB 500 mg, 500 mg, IntraVENOUS, Q8H NR, Hollie Salk, MD, Last Rate: 100 mL/hr at 07/17/16 1226, 500 mg at 07/17/16 1226    ondansetron (ZOFRAN) injection 4 mg, 4 mg, IntraVENOUS, Q6H PRN, Hollie Salk, MD, 4 mg at 07/07/16 1806    pantoprazole (PROTONIX) injection 40 mg, 40 mg, IntraVENOUS, Daily, Hollie Salk, MD, 40 mg at 07/17/16 0921    propofol (DIPRIVAN) bolus 10 mg/mL, 30 mg, IntraVENOUS, Q3 Min PRN, Andaya, Aileen L, NP    propofol (DIPRIVAN) bolus 10 mg/mL, 30 mg, IntraVENOUS, Q2H PRN, Andaya, Aileen L, NP, 30 mg at 07/17/16 0418    propofol (DIPRIVAN) infusion (1000 mg/100 mL), 0-80 mcg/kg/min, IntraVENOUS, Continuous, Andaya, Aileen L, NP, Last Rate: 12.2 mL/hr at 07/17/16 1333, 20  mcg/kg/min at 07/17/16 1333    quetiapine (SEROQUEL) tablet 150 mg, 150 mg, Oral, Q8H, Earley Abide, MD, 150 mg at 07/17/16 1427    sodium chloride (PF) 0.9 % flush 10 mL, 10 mL, IntraCATHETER, PRN, Rae Halsted, Nii-Kabu, MD    sodium chloride (PF) 0.9 % flush 20 mL, 20 mL, IntraCATHETER, PRN, Kabutey, Nii-Kabu, MD    sodium chloride (PF) 0.9 % flush 50 mL, 50 mL, IntraVENOUS, Once PRN, Rae Halsted, Nii-Kabu, MD    sodium chloride 0.9% infusion, , IntraVENOUS, Continuous, Alycia Rossetti, MD, Last Rate: 10 mL/hr at 07/17/16 3785     sodium chloride 0.9% infusion, , IntraVENOUS, Once PRN, Dorcas Carrow, MD, Last Rate: 10 mL/hr at 07/17/16 0600    sodium chloride 0.9% infusion, , IntraVENOUS, Once PRN, Loletta Specter, MD    thiamine (VITAMIN B1) tablet 100 mg, 100 mg, Oral, Daily, Loletta Specter, MD, 100 mg at 07/17/16 8850    vancomycin (VANCOCIN) 500 mg in sodium chloride 0.9 % 100 mL enema, 500 mg, Rectal, Q6H NR, Kabutey, Nii-Kabu, MD, 500 mg at 07/17/16 1225    vancomycin (VANCOCIN) oral solution 500 mg, 500 mg, NG Tube, Q6H NR, Alycia Rossetti, MD, 500 mg at 07/17/16 1121    Facility-Administered Medications Ordered in Other Encounters:     propofol (DIPRIVAN) injection (200 mg/20 mL), , IntraVENOUS, Intra-Op PRN, Alycia Rossetti, MD, 200 mg at 07/12/16 1325    rocuronium (ZEMURON) syringe, , IntraVENOUS, Intra-Op PRN, Alycia Rossetti, MD, 50 mg at 07/12/16 1325    PHYSICAL EXAM  Temperature:  [99.3 F (37.4 C)-101.5 F (38.6 C)] 101.5 F (38.6 C) (12/12 1400)  Blood pressure (BP): (113-142)/(57-81) 135/66 (12/12 1400)  Heart Rate:  [98-120] 119 (12/12 1400)  Respirations:  [10-24] 13 (12/12 1400)  Pain Score: Assumed Pain (12/12 1200)  O2 Device: ETT (12/12 1200)  SpO2:  [98 %-100 %] 100 % (12/12 1400)    Physical Exam:   07/17/16  1200 07/17/16  1206 07/17/16  1300 07/17/16  1400   BP: 128/74  121/62 135/66   Pulse: 113 112 111 119   Resp: 12 15 12 13    Temp: 100 F (37.8 C)  100.6 F (38.1 C) 101.5 F (38.6 C)   SpO2: 98% 100% 100% 100%     General: Intubated and sedated  HEENT: NCAT, PERRL, no scleral icterus, oropharynx clear without ulcers/thrush  Lungs: CTAB, no wheezes/rales/rhonchi  Heart: RRR, S1, S2, No M/R/G  Abdomen: Soft, NT/ND, normoactive bowel sounds, no HSM, rectal tube inplace  Extremities: B/l Edema in feet, 2+ distal pulses,       WBC/HGB/HCT/PLT:  23.0/8.0/24.0/385 (12/12 0408)  BMP:  142/3.5/108/--/7/0.7/113 (12/12 0408)  Lab Results   Component Value Date    AST 45 07/14/2016     ALT 23 07/14/2016    ALK 58 07/14/2016    ALB 2.6 07/14/2016    TBILI 0.5 07/14/2016    DBILI 0.1 07/12/2016       No results found for: THELPERABS, HIV1RNAULTRA  No results found for: CMVIGGAB, [place], BKVIRUSQT    No results found for: BLOODCULT, FUNGALBC, AFBBACTCULT, URINECULTURE, QTFERON, QUANTIFERON, CRYPTOAG, CRYPTOCAGCSF, COCCICF, COCCICFCSF, COCCIIMMDIIF, COCCIIMMCSF, HISTOAGUR, CMVDNAQT, CMVDNAQTCSF        Assessment and Plan  39/M with hx of traumatic LLE injury 3 yrs ago with claudication s/p /u at [facility] who was admitted to [facility] on 07/06/16 for bypass procedure, needed re-bypass on 12/2, then on 12/7 found to have C diff colitis and started on treatment. Because of persistence  of symptoms ID was consulted for further recommendations.    Severe C. Diff infection  - C diff toxin positive on 12/7, started on PO Vanco and IV Flagyl on 12/7, Stool output significantly decreased.  fever has resolved, hemodynamically stable, still with leukocytosis but stable. At this point we feel that the leukocytosis it is residual from the bowel inflammation and will still take some time to resolve.       Recommendations:  - D/C Vancomycin enemas  - If patient continues to improve for the next few days then D/C IV flagyl and continue Vancomycin at 147m QID for 14 days - End date 25th  - avoid addition of broad-spectrum antibiotics as this may impede improvement of CDI      Patient and plan discussed with ID attending Dr. HDonald Siva ID will sign off at this time please call with any further questions or concerns.    Recommendations communicated to primary team.       Date of Attending Evaluation:  07/17/2016    I saw and examined the patient independently, and discussed the case with the Fellow.   I agree with the final findings and plan as documented in the record.   We formulated the assessment and plan together.  Any additions or revisions are included in the record.     40yo M admitted for vascular LLE bypass procedure.  Course complicated by C. Diff colitis.  Improving.  On exam, pt is intubated, abd distended but soft.  Recommend DC vanco enema.  If continues to improve, DC IV flagyl.  Oral vanco x 14 days.  Ok to switch to standard dose at discharge.    LBess Harvest MD

## 2016-07-17 NOTE — Interdisciplinary (Signed)
BS improved post sx.

## 2016-07-17 NOTE — Progress Notes (Signed)
Inpatient Heparin Drug Monitoring Note    Jerome QuarryBrandon Scholes is a 40 year old male on anticoagulation therapy for lower extremity ischemia.    Past Medical History:  Past Medical History:   Diagnosis Date    Alcohol withdrawal delirium, acute, hyperactive (CMS-HCC)     Alcoholism /alcohol abuse (CMS-HCC)     Clostridium difficile colitis        Allergies:  Allergies   Allergen Reactions    No Known Allergies [Other] Other     Received name: No Known Allergies       Medications:    Current Inpatient Medications:   acetaminophen  975 mg Q8H    aspirin  81 mg Daily    chlorhexidine  30 mL BID    folic acid  1 mg Daily    influenza vaccine >=3 yrs  0.5 mL Prior to discharge    LORazepam  4 mg Q4H    metoprolol  2.5 mg Q6H    metronidazole (FLAGYL) IVPB  500 mg Q8H NR    pantoprazole  40 mg Daily    QUEtiapine  150 mg Q8H    thiamine  100 mg Daily    vancomycin 500 mg in NS 100 mL rectal enema  500 mg Q6H NR    vancomycin  500 mg Q6H NR      dextrose-sodium chloride 5%-0.45% 100 mL/hr at 07/17/16 0600    fentaNYL 150 mcg/hr (07/17/16 0642)    heparin (porcine) 1,700 Units/hr (07/17/16 0600)    LORazepam Stopped (07/16/16 1200)    propofol 19.954 mcg/kg/min (07/17/16 0600)       aPTT Goal:   65 - 90 seconds    Dosing weight = 91 kg     Current Heparin Rate:     Dose (Units/hr) Heparin: 1700 Units/hr    Labs:    Coags:  Recent Labs      07/14/16   2306  07/15/16   1208  07/15/16   2106  07/16/16   0520  07/16/16   1252  07/16/16   2028  07/17/16   0408   PTT  80.7*  97.6*  113.7*  51.3*  75.6*  69.4*  71.7*       Labs:  (Last 3 days):    Recent Labs      07/15/16   0756  07/15/16   1908  07/16/16   0520  07/17/16   0408   HGB  6.7*  8.0*  8.4*  8.0*   HCT  19.9*  24.8*  25.1*  24.0*   CREAT  0.9   --   0.9  0.7       Drug/Disease Interactions and Assessments:    - Bleeding Assessment:  H/H is stable. Patient does not exhibit any signs or symptoms of bleeding (spoke to RN).      - Current Nutrition Assessment:   Recent changes in nutrition/diet? no  Tube Feed Adult Impact Peptide 1.5 Cal; Adult Tube Feed Route: Nasogastric Feeding Tube; Water Flush Volume: 25 mL; Water Flush Frequency: 4 hr; Continuous or Intermittent: Continuous; Initial rate (mL/hour): 20; Rate Increase (mL/hour): 20; Rate ...    Assessment/Plan:  - aPTT= 71.7 seconds is within therapeutic goal range x24 hours  - Will continue heparin maintenance infusion at 1700 units/hour   - Check aPTT every 12 hours, to be drawn at 16:00  - Continue to monitor for signs and symptoms of bleeding  - Patient does not have IM medications ordered. If  given use small needle and apply pressure to injection site for 2 minutes.    Pharmacy will continue to monitor closely. For questions please call [phone]    Frutoso SchatzKathleen Thai Ansel BongBuu Kanin Lia, W[facility] Rehabilitation CenterHARMD

## 2016-07-17 NOTE — Progress Notes (Signed)
Progress Note                Surgery-Vascular    Patient Name: Jerome Hunt  MRN: [id number]  Room#: 6232/6232-01    Hospital Day:   11 days - Admitted on: 07/06/2016 Post-Op Day: 9 Service: Surgery-Vascular      SUBJECTIVE: No acute events overnight. Unable to extubate 2/2 secretion. Off atavan gtt    OBJECTIVE:    Pain score: Assumed Pain    Vital Signs:     Latest Entry  Range (last 24 hours)    Temperature: 100.6 F (38.1 C)  Temp  Avg: 100.1 F (37.8 C)  Min: 99.3 F (37.4 C)  Max: 101.7 F (38.7 C)    Blood pressure (BP): 145/82  BP  Min: 113/75  Max: 145/82    Heart Rate: 121  Pulse  Avg: 113  Min: 98  Max: 130    Respirations: 15  Resp  Avg: 14.6  Min: 10  Max: 24    SpO2: 100 %  SpO2  Avg: 99.9 %  Min: 98 %  Max: 100 %     Weight: 101 kg (222 lb 10.6 oz)  Percentage Weight Change (%): -0.39 %    12/11 0600 - 12/12 0559  In: 4565.6 [I.V.:3875.6]  Out: 2915 [Urine:2415]  Bowel movement: flexiseal in place      Intake/Output Summary (Last 24 hours) at 07/15/16 2057  Last data filed at 07/15/16 1500   Gross per 24 hour   Intake          1959.25 ml   Output             4430 ml   Net         -2470.75 ml           Physical:    General Appearance: sedated, intubated  Heart:  Sinus tachycardia .  Lungs: bilateral breath sounds present. ETT in place .  Abdomen: soft, distended. nonperitoneal   Extremities:  RLE: dopplerable PT and DP signals present  LLE: wound without erythema. Intact. Dressing in place. Dopplerable AT signal.     Glasgow Coma Scale Score: 10    Labs:     CBC  Recent Labs      07/16/16   0520  07/17/16   0408   HGB  8.4*  8.0*   HCT  25.1*  24.0*        Chemistry  Recent Labs      07/16/16   0438  07/16/16   0520  07/17/16   0408  07/17/16   0508   K   --   3.4*  3.5   --    CL   --   108*  108*   --    BICARB  28.7*   --    --   31.2*   BUN   --   6*  7   --    CREAT   --   0.9  0.7   --    GLU   --   123*  113   --    [place]   --   8.0*  8.0*   --    MG   --   1.9  1.8*   --     PHOS   --   3.4  2.9   --      No results for input(s): ALK, AST, ALT, TBILI, DBILI, ALB in the last 72  hours.       Coags  Recent Labs      07/17/16   0408  07/17/16   1615   PTT  71.7*  64.9*       Cultures:  C. Diff positive      Radiology:   CXR 07/15/2016  Impression: Stable support tubes and catheter. Persistent low volume lungs with mild pulmonary vascular congestion and trace pleural effusion. No significant pneumothorax. Stable cardiomediastinal silhouette and osseous structures.    Medications:  Scheduled Meds   acetaminophen  975 mg Q8H    aspirin  81 mg Daily    chlordiazePOXIDE  50 mg Q8H    chlorhexidine  30 mL BID    folic acid  1 mg Daily    influenza vaccine >=3 yrs  0.5 mL Prior to discharge    LORazepam  4 mg Q4H    metoprolol tartrate  12.5 mg Q12H    metronidazole (FLAGYL) IVPB  500 mg Q8H NR    pantoprazole  40 mg Daily    QUEtiapine  150 mg Q8H    thiamine  100 mg Daily    vancomycin 500 mg in NS 100 mL rectal enema  500 mg Q6H NR    vancomycin  500 mg Q6H NR     PRN Meds   fentaNYL  25 mcg Q5 Min PRN    fentaNYL  25 mcg Q2H PRN    LORazepam  1 mg Q5 Min PRN    LORazepam  1 mg Q2H PRN    LORazepam  1.5 mg Q30 Min PRN    Or    LORazepam  3 mg Q30 Min PRN    ondansetron  4 mg Q6H PRN    propofol  30 mg Q3 Min PRN    propofol  30 mg Q2H PRN    sodium chloride (PF)  10 mL PRN    sodium chloride (PF)  20 mL PRN    sodium chloride (PF)  50 mL Once PRN       ASSESSMENT / PLAN:  40 year old male admitted with 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 Patient underwent attempted Popliteal-posterior tibial artery bypass on  12/1 with resulting graft failure and reoperation on 12/2 and underwent Popliteal to peroneal bypass with jump graft from peroneal artery to posterior tibial artery - this graft has subsequently gone down as well.     Postoperatively he developed acute alcohol withdrawal due to his  undisclosed alcohol abuse.  Due to his clinical decline and the need for further imaging studies he was intubated and sedated which has been controlling his agitation fairly well.     He currently has a active c diff infection without signs toxic megacolon. WBC downtrending, Afeb overnight      Neuro: Fent/prop wean to sedation. CIWA  CV:  Tachycardic due to infection and alcoholic withdrawal, improving  Pulm: acute respiratory failure intubated and sedated minimal vent settings wean vent as tolerated extubation per critical care team when safe. Evaluate for extubation in AM  GI:  Keo feed in place active C diff colitis,FEEDS at goal  Renal:  Normal creatinine continue Foley catheter for critical illness and close urine monitoring. rhabdomyolysis has resolved  Heme: acute blood loss anemia - some element of dilutional component given no active signs of bleeding, transfuse 1 unit pRBC YESTERDAY continue heparin drip  ID:  C diff colitis -continue vancomycin and Flagyl,  afeb, wbc down-trending. May need to consider colonic lavage if acutely decompensates will need emergent colectomy  Endocrine: ISS  Disposition critical care    Continued prolonged recovery  Wean vent   Pain mgmt  Anticoagulation  Iv abx for c.diff treatment

## 2016-07-17 NOTE — Interdisciplinary (Signed)
Pt sx multiple times.

## 2016-07-17 NOTE — Progress Notes (Signed)
Progress Note  Surgical Intensive Care Unit    Patient Name: Jerome Hunt  MRN: [id number]     Admitting Service: [facility] Attending Provider: Rae Halsted    Mechanism of Injury: <principal problem not specified>    Date: 07/17/16  Hospital Day:   11 days - Admitted on: 07/06/2016    Injuries/Problems: Problem List:  2017-12: Peripheral vascular disease (CMS-HCC)  2017-11: Peripheral artery disease (CMS-HCC)  Clostridium difficile colitis  Alcoholism /alcohol abuse (CMS-HCC)  Alcohol withdrawal delirium, acute, hyperactive (CMS-HCC)    OBJECTIVE:     Vitals signs:     Latest Entry  Range (last 24 hours)    Temperature: 99.5 F (37.5 C)  Temp  Avg: 99.6 F (37.6 C)  Min: 98.1 F (36.7 C)  Max: 100.4 F (38 C)    Blood pressure (BP): 125/68  BP  Min: 105/59  Max: 142/77    Heart Rate: 111  Pulse  Avg: 109.7  Min: 98  Max: 120    Respirations: 14  Resp  Avg: 14.1  Min: 10  Max: 26    SpO2: 100 %  SpO2  Avg: 99.9 %  Min: 99 %  Max: 100 %       No Data Recorded       No Data Recorded       No Data Recorded     Weight: 101 kg (222 lb 10.6 oz)  Percentage Weight Change (%): -0.39 %    I&O:  Intake/Output       07/16/16 0600 - 07/17/16 0559 07/17/16 0600 - 07/18/16 0559      0600-1759 3149-7026 Total 0600-1759 3785-8850 Total       Intake    I.V.  2109.2  1766.5 3875.6  304  -- 304    NG/GT  120  570 690  120  -- 120    Total Intake 2229.2 2336.5 4565.6 424 -- 424       Output    Urine  540  1875 2415  150  -- 150    Stool  300  200 500  --  -- --    Total Output 840 2075 2915 150 -- 150       Net I/O     1389.2 261.5 1650.6 274 -- 274            Labs:  CBC  Recent Labs      07/15/16   0756   07/16/16   0520  07/17/16   0408   WBCCOUNT  23.7*   < >  22.7*  23.0*   HGB  6.7*   < >  8.4*  8.0*   PLCTEL  280   < >  351  385   BANDP  25.7   --    --   28.6   LYMPP2  9.5   --   7.0  14.3   MONP2  5.7   --   6.0  8.6    < > = values in this interval not displayed.          Chemistry  Recent Labs      07/16/16    0520  07/17/16   0408   SODIUM  142  142   K  3.4*  3.5   CL  108*  108*   CO2  29  28   BUN  6*  7   CREAT  0.9  0.7   GLU  123*  113  [place]  8.0*  8.0*   MG  1.9  1.8*   PHOS  3.4  2.9     No results for input(s): ALK, AST, ALT, TBILI, DBILI, ALB in the last 72 hours.    Coags  Recent Labs      07/16/16   2028  07/17/16   0408   PTT  69.4*  71.7*       Medications:  Scheduled Meds   acetaminophen  975 mg Q8H    aspirin  81 mg Daily    chlorhexidine  30 mL BID    folic acid  1 mg Daily    influenza vaccine >=3 yrs  0.5 mL Prior to discharge    LORazepam  4 mg Q4H    metoprolol  2.5 mg Q6H    metronidazole (FLAGYL) IVPB  500 mg Q8H NR    pantoprazole  40 mg Daily    QUEtiapine  150 mg Q8H    thiamine  100 mg Daily    vancomycin 500 mg in NS 100 mL rectal enema  500 mg Q6H NR    vancomycin  500 mg Q6H NR     PRN Meds   fentaNYL  25 mcg Q5 Min PRN    fentaNYL  25 mcg Q2H PRN    LORazepam  1 mg Q5 Min PRN    LORazepam  1 mg Q2H PRN    LORazepam  1.5 mg Q30 Min PRN    Or    LORazepam  3 mg Q30 Min PRN    ondansetron  4 mg Q6H PRN    propofol  30 mg Q3 Min PRN    propofol  30 mg Q2H PRN    sodium chloride (PF)  10 mL PRN    sodium chloride (PF)  20 mL PRN    sodium chloride (PF)  50 mL Once PRN    sodium chloride   Once PRN    sodium chloride   Once PRN     IV Meds   dextrose-sodium chloride 5%-0.45% 100 mL/hr at 07/17/16 0600    fentaNYL 150 mcg/hr (07/17/16 0642)    heparin (porcine) 1,700 Units/hr (07/17/16 0600)    LORazepam Stopped (07/16/16 1200)    propofol 19.954 mcg/kg/min (07/17/16 0600)       Allergies:  Allergies   Allergen Reactions    No Known Allergies [Other] Other     Received name: No Known Allergies       ASSESSMENT / PLAN / RECOMMENDATIONS:  55M with a traumatic left lower extremity injury 3 years prior to presents with pain to left lower extremity concerning for claudication  S/p left popliteal to distal PT bypass   S/p Bypass revision, left popliteal to peroneal and peroneal to PT jump graft    07/06/16  LEFT POPLITEAL TO DISTAL POSTERIOR TIBIAL ARTERY BYPASS LEFT SAPHENOUS VEIN HARVEST AORTOGRAM LEFT LEG ANGIOGRAM LEFT POPLITEAL ARTERY ANGIOPLASTY (4X4, 5X4) LEFT DISTAL POSTERIOR TIBIAL ARTERY ANGIOPLASTY       07/07/16  Redo Left transinterosseous below-knee popliteal-peroneal and sequential peroneal-distal posterior tibal-medial tarsal reverse saphenous vein bypass; Left distal segmental fibula resection to expose distal peroneal artery,  Thrombectomy of left below-knee popliteal-posterior tibial reverse saphenous vein bypass    Neuro:   GCS 10T  - off ativan and fentanyl 150 gtt.  propofol 20 and wean off ativan  - CIWA for h/o etoh Abuse and withdrawal ativan scale increased  -librium 50 q8hrs,seroquel '150mg'$  q8hrs  -aspirin, gabapentin  -tylenol  schd, ativan '4mg'$  q4hrs  Thiamine folate  -Head CT 12/7 Negative    CV:BP  Min: 105/59  Max: 142/77  Pulse  Avg: 109.7  Min: 98  Max: 120  100s-130s/50s-70s  HR 100s  - metop 2.'5mg'$  q6hrs     #LLE claudication s/p bypass   - bypass likely thrombosed again  - arterial duplex 07/08/16: There is distal arterial flow noted in the posterior tibial artery. The bypass graft is not visualized.Mild stenosis at the left mid-distal SFA.  - ABI with AT on LLE is 0.63 07/08/16  - q1 neurovscular checks  - on heparin gtt 1700  - pulse ox monitor on LLE (currently at 100%)   -DVT Bilt LE + UE [place] negative 07/12/16  -CTA runoff 07/12/16 shows Nonopacified left popliteal to distal posterior tibial artery bypass, suggesting graft failure.    Pulmonary:  Intubated AC/VC 12/500/40%/5   cpap this am did well on it  Arterial Blood Gas result:  pH 7.38;  PCO2 54,  pO2 134, HCO3 31, %O2 Sat 98.  Extubate once weaned off  CT chest 07/12/16 shows no findings              FEN/GI:  KUB 12/7 shows worsening colonic illeus. CT abdomen 07/12/16 shows pan colitis.  Fluids TKO   protonix, Replete electrolytes mag   Tube feeds @ goal    Diet:Tube Feed Adult Impact Peptide 1.5 Cal; Adult Tube Feed Route: Nasogastric Feeding Tube; Water Flush Volume: 25 mL; Water Flush Frequency: 4 hr; Continuous or Intermittent: Continuous; Initial rate (mL/hour): 20; Rate Increase (mL/hour): 20; Rate ...     Renal:   Intake/Output Summary (Last 24 hours) at 07/17/16 0759  Last data filed at 07/17/16 0700   Gross per 24 hour   Intake             4605 ml   Output             3025 ml   Net             1580 ml     Stool 500  [place]ley  -f/u Cr 1.3 -> 1.0 -> 1.0 ->0.9 -> 0.7    Hematology: received 1 u PRBC   hb 10.6 -> 8.9 ->8.0 -> 7.0 -> 9.3 ->8.3 ->7.8 ->7.7 ->7.3->6.7 -> 1u ->8.0 ->8.4 ->8.0  Hg 7.0 received 1 uPRBCs 12/5, 12/10  -no DVT ppx on board  D/c'd central, placed PICC    ID: Temp  Avg: 99.6 F (37.6 C)  Min: 98.1 F (36.7 C)  Max: 100.4 F (38 C)  afebrile WBC 17.6 -> 13.3 -> 18.8 -> 23.1 -> 26 ->22 ->23; with multiple foul loose stool; c. Diff colitis  Micro: BCx 12/1 NGTD, BC 12/5 NGTD  -UA 12/5 and 12/7 negative   -c.diff positive  -f/u BCx NGTD  -on vanco enema, oral vanco, iv flagyl  -f/u ID recs  - continue PO Vancomycin '500mg'$  q6 hours, and IV Flagyl '500mg'$  q8hours  - can dc the Vancomycin enema as there is no evidence of ileus  - continue supportive measures (fluids, replacement of electrolytes)  - daily abdominal exam/KUB to monitor   - avoid addition of broad-spectrum antibiotics as this may impede improvement of CDI    Endocrine:  FSBS: < 180    MSK:  PT/OT:  No  Weight Bearing Status:  weight bear as tolerated. ABI 0.6. Xray shows right knee effusion  -d/c f/u CPK 4000 -> 5493 -> 4656 ->3747 ->  1746  -f/u Kub for hip       ICU Care:    Feedings: yes  Analgesia: Yes  Sedation: Yes  Thromboprophylaxis:SCDs, early mobility protocol and subcutaneous heparin  Head-of-bed elevation: Yes  Ulcer prophylaxis: no  Glycemic control: no  GI Bowel care: Yes  Indwelling catheter removal: no  ID: de-escalation of Abx: yes   Spontaneous breathing trial: N/A    Family discussion: yes    DISPO: SICU      C.diff colitis, acute respiratory failure, sepsis, DT,   S/p bypass 12/1  Neuro:Delirium tremens. Previously on precedex gtt. Now intubated on ativan gtt weaned off ativan. on librium wean to 50 Q 8. On fentanyl and propofol. Plan wean to extubation    GCS 10T, RASS +2 -> - 2, CAM -  Seroquel . Folate. Thiamine. MVI. Gaba and tylenol.  Cardio: HDS. Tachycardia improving. 91-110 on heparin gtt for graft (most recent PTT 51). Metoprolol 2.5 Q 6   Pulm: acute respiratory failure on mechanical ventilation 40%/5 peep. ABG 7.38/54/134/31/  RSBI 12TV 733 NIF -40. Plan to extubate today once weaned off gtt  GI: Severe cdiff colitis. On oral vanco (500) and IV flagyl started 7th afternoon. Started rectal vanco enemas 12/9. Stool output 500 significantly improved. WBC stable wbc 23-> 23 and afebrile and no guarding on exam. Now on TF @ goal   Renal: 4.6/3.0 (1.6+) PLan to tko fluids since stool output down. previously on rhabdo protocol for rising CK now resolved  ID: cdiff colitis on oral + rectal vanco and IV flagyl. Oral vanco increased to 500 per ID + monitor abd exam.  Plan finish course of PO vanco/IV flagyl x14d   Heme: acute blood loss anemia hgb stable 8.0 from 8.4   Endo: FSBS <180 on  proph: heparin gtt for graft.  Vasc: Popliteal-posterior tibial artery bypass on 12/1 with resulting graft failure and reoperation on 12/2 and underwent Popliteal to peroneal bypass with jump graft from peroneal artery to posterior tibial artery - this graft has subsequently gone down as well. has collaterals and flow to the foot via AT   Lines/tubes/drains:   PICC line   foley   aline             ____________________________________________________    ATTENDING PROGRESS RECORD AND ATTESTATION:    Date of evaluation:  07/17/16 11:30 AM    SICU Staff    Patient seen and examined with resident/fellow team. I agree with the  findings and plan as outlined above.  Critical care time was 35 minutes, excluding procedures.    40 year-old male with peripheral vascular disease s/p LLE bypass and subsequent revision.  Developed Clostridium difficile colitis, responding to medical therapy.  CC Dx:  respiratory failure, acute blood loss anemia due to surgery and critical illness, Clostridium difficile colitis.  Doing OK today.  GCS 10T on Fentanyl and Propofol gtts -- wean as tolerated.  Hemodynamically stable, tachycardic.  Continue mechanical ventilation -- currently on FiO2 0.40 and PEEP 5.  Plan for extubation today.  Good UOP, Cr 0.7.  Abdomen soft.  Tolerating TF.  Hgb stable at 8.0.  WBC decreased to 23.0k.  Currently on PO/PR Vancomycin and IV Flagyl.  BG OK.      Note Author: Erskine Emery, MD

## 2016-07-17 NOTE — Plan of Care (Signed)
Problem: Promotion of health and safety  Goal: Promotion of Health and Safety  The patient remains safe, receives appropriate treatment and achieves optimal outcomes (physically, psychosocially, and spiritually) within the limitations of the disease process by discharge.   Outcome: Progressing toward goal, anticipate improvement over: next 12-24 hours   07/17/16 0612   Adult/Peds Plan of Care   Standard of Care/Policy Skin SOC;Restraints SOC;Falls reduction   Outcome Evaluation (rationale for progressing/not progessing patient is off of ativan now. still needs propofol   Patient Specific Goal, Promotion of Health & Safety try to wean patient if he is able to cooperate and not be agitated   Additional Individualized Interventions/Recommendations Nursing Shift Summary    Provider Notification for the past 12 hrs:   Provider Name Reason for Communication Provider Response   07/16/16 2000 - patient needs wrist restraint orders -   07/16/16 2100 - pt needs soft wrist restraint orders -   07/17/16 0000 dr. Malva Cogantellez asked md to place restraint orders for patient md said he will place the restraint orders           continue with po vanco and enema

## 2016-07-17 NOTE — Plan of Care (Signed)
Problem: Promotion of health and safety  Goal: Promotion of Health and Safety  The patient remains safe, receives appropriate treatment and achieves optimal outcomes (physically, psychosocially, and spiritually) within the limitations of the disease process by discharge.   Outcome: Progressing toward goal, anticipate improvement over: >48 hours   07/17/16 1656   Adult/Peds Plan of Care   Standard of Care/Policy Critical Care SOC;[facility];Restraints SOC;Falls reduction   Outcome Evaluation (rationale for progressing/not progessing VSS. Remains on Propofol & Fentanyl gtt. SBT/CPAP Q Day   Patient Specific Goal, Promotion of Health & Safety SBT Q AM for possible extubation. Remains to have large amounts of secretions requiring suctioning Q 1hr.   Additional Individualized Interventions/Recommendations  Flexiseal in place. Vanco Enema Q6hrs.   Additional Individualized Interventions/Recommendations + Doppler signals to Pedal Pulse. Continue Q1hr Doppler checks to BLE.   Additional Individualized Interventions/Recommendations Wife=Rachel at bedside for support.     Nursing Shift Summary    Shift Comments for the past 12 hrs:   Comments   07/17/16 1146 Interdisiplinary Rounds at bedside.   07/17/16 1556 Dr Ernestina PatchesSabrina Oukil (SICU) at bedside and put patient on CPAP mode and turned off sedation to test if patient is ready for extubation.   07/17/16 1616 SICU Team afternoon bedside roumds.

## 2016-07-18 ENCOUNTER — Inpatient Hospital Stay: Payer: No Typology Code available for payment source

## 2016-07-18 LAB — BASIC METABOLIC PANEL, BLOOD
BUN: 10 mg/dL (ref 7–25)
CO2: 31 mmol/L (ref 21–31)
Calcium: 7.9 mg/dL — ABNORMAL LOW (ref 8.6–10.3)
Chloride: 106 mmol/L (ref 98–107)
Creat: 0.8 mg/dL (ref 0.7–1.3)
Electrolyte Balance: 4 mmol/L (ref 2–12)
Glucose: 133 mg/dL — ABNORMAL HIGH (ref 70–115)
Potassium: 3.5 mmol/L (ref 3.5–5.1)
Sodium: 141 mmol/L (ref 136–145)
eGFR - high estimate: >60 (ref >59)
eGFR - low estimate: >60 (ref >59)

## 2016-07-18 LAB — URINALYSIS WITH CULTURE REFLEX, WHEN INDICATED
Bilirubin, UA: NEGATIVE
Glucose, UA: NEGATIVE MG/DL
Hemoglobin, UA: NEGATIVE
Ketones, UA: NEGATIVE MG/DL
Leukocyte Esterase, UA: NEGATIVE
Nitrite, UA: NEGATIVE
Protein, UA: NEGATIVE MG/DL
RBC, UA: 3 #/HPF (ref 0–3)
Specific Grav, UA: 1.014 (ref 1.003–1.030)
UA Cult: NEGATIVE
Urobilinogen, UA: <2 MG/DL (ref <2.0)
WBC, UA: 1 #/HPF (ref 0–5)
pH, UA: 6 (ref 5.0–8.0)

## 2016-07-18 LAB — TYPE, SCREEN & CROSSMATCH
ABO/Rh(D): B POS
Antibody Screen Result: NEGATIVE
Unit Division: 0
Unit Division: 0
Units Ordered: 2

## 2016-07-18 LAB — ARTERIAL BLOOD GAS
Base Excess: 6.3 mmol/L
HCO3: 30.2 mmol/L — ABNORMAL HIGH (ref 21.0–27.0)
Inspired O2: 0.4
O2 Sat: 99.2 % — ABNORMAL HIGH (ref 95.0–99.0)
Patient Temp: 39.5 DEG C
pCO2 (Temp Adjusted): 45.5 MMHG — ABNORMAL HIGH (ref 36.0–42.0)
pH (Temp Adjusted): 7.45 — ABNORMAL HIGH (ref 7.38–7.42)
pO2 (Temp Adjusted): 169 MMHG — ABNORMAL HIGH (ref 80.0–104.0)

## 2016-07-18 LAB — CBC WITH DIFF, BLOOD
Atypical Lymphocytes %: 2.9 %
Atypical Lymphocytes Absolute: 0.6 10*3/uL — ABNORMAL HIGH (ref 0.0–0.5)
Basophils %: 0 %
Basophils Absolute: 0 10*3/uL (ref 0.0–0.2)
Eosinophils %: 2.9 %
Eosinophils Absolute: 0.6 10*3/uL — ABNORMAL HIGH (ref 0.0–0.5)
Hematocrit: 25.6 % — ABNORMAL LOW (ref 39.5–50.0)
Hgb: 8.4 G/DL — ABNORMAL LOW (ref 13.5–16.9)
Lymphocytes %.: 9.5 %
Lymphocytes Absolute: 2.1 10*3/uL (ref 0.9–3.3)
MCH: 30.9 PG (ref 27.0–33.5)
MCHC: 32.8 G/DL (ref 32.0–35.5)
MCV: 94.1 FL (ref 81.5–97.0)
MPV: 9.2 FL (ref 7.2–11.7)
Metamyelocytes %: 1.9 %
Metamyelocytes Absolute: 0.4 10*3/uL — ABNORMAL HIGH
Monocytes %: 7.6 %
Monocytes Absolute: 1.7 10*3/uL — ABNORMAL HIGH (ref 0.0–0.8)
PLT Count: 442 10*3/uL — ABNORMAL HIGH (ref 150–400)
Platelet Morphology: NORMAL
RBC: 2.72 10*6/uL — ABNORMAL LOW (ref 3.70–5.00)
RDW-CV: 15.2 % — ABNORMAL HIGH (ref 11.6–14.4)
Seg Neutro % (M): 75.2 %
Seg Neutro Abs (M): 16.5 10*3/uL — ABNORMAL HIGH (ref 2.0–7.5)
White Bld Cell Count: 21.9 10*3/uL — ABNORMAL HIGH (ref 4.0–10.5)

## 2016-07-18 LAB — MAGNESIUM, BLOOD: Magnesium: 1.9 mg/dL (ref 1.9–2.7)

## 2016-07-18 LAB — APTT, BLOOD: PTT: 67.9 s — ABNORMAL HIGH (ref 24.1–36.3)

## 2016-07-18 LAB — PHOSPHORUS, BLOOD: Phosphorus: 1.7 MG/DL — ABNORMAL LOW (ref 2.5–5.0)

## 2016-07-18 MED ORDER — METOPROLOL TARTRATE 5 MG/5ML IV SOLN
5.0000 mg | INTRAVENOUS | Status: DC | PRN
Start: 2016-07-18 — End: 2016-07-27

## 2016-07-18 MED ORDER — METOPROLOL TARTRATE 25 MG OR TABS
25.0000 mg | ORAL_TABLET | Freq: Two times a day (BID) | ORAL | Status: DC
Start: 2016-07-18 — End: 2016-07-27
  Administered 2016-07-19 – 2016-07-27 (×17): 25 mg via ORAL
  Filled 2016-07-18 (×17): qty 1

## 2016-07-18 MED ORDER — LORAZEPAM 2 MG/ML IJ SOLN
2.0000 mg | INTRAMUSCULAR | Status: DC | PRN
Start: 2016-07-18 — End: 2016-07-26
  Administered 2016-07-20 (×3): 2 mg via INTRAVENOUS
  Filled 2016-07-18 (×2): qty 1

## 2016-07-18 MED ORDER — ACETAMINOPHEN 80 MG RE SUPP
1000.0000 mg | Freq: Three times a day (TID) | RECTAL | Status: DC
Start: 2016-07-18 — End: 2016-07-18
  Filled 2016-07-18 (×3): qty 2

## 2016-07-18 MED ORDER — DEXTROSE-NACL 5-0.45 % IV SOLN (CUSTOM)
INTRAVENOUS | Status: DC
Start: 2016-07-18 — End: 2016-07-27
  Administered 2016-07-18 – 2016-07-27 (×5): via INTRAVENOUS

## 2016-07-18 MED ORDER — LORAZEPAM 2 MG/ML IJ SOLN
4.0000 mg | INTRAMUSCULAR | Status: DC | PRN
Start: 2016-07-18 — End: 2016-07-26
  Administered 2016-07-19 – 2016-07-23 (×16): 4 mg via INTRAVENOUS
  Filled 2016-07-18 (×17): qty 2

## 2016-07-18 MED ORDER — ACETAMINOPHEN 650 MG RE SUPP
975.0000 mg | Freq: Three times a day (TID) | RECTAL | Status: DC
Start: 2016-07-18 — End: 2016-07-20
  Administered 2016-07-18 – 2016-07-20 (×5): 975 mg via RECTAL
  Filled 2016-07-18 (×5): qty 2

## 2016-07-18 MED ORDER — CHLORDIAZEPOXIDE HCL 25 MG OR CAPS
50.0000 mg | ORAL_CAPSULE | Freq: Two times a day (BID) | ORAL | Status: DC
Start: 2016-07-18 — End: 2016-07-19
  Filled 2016-07-18: qty 2

## 2016-07-18 NOTE — Progress Notes (Signed)
DAILY PROGRESS NOTE     07/18/16     Current Hospital Stay:   12 days - Admitted on: 07/06/2016    Subjective:  Intubated/sedated    Objective:      Current Medications:   acetaminophen  975 mg Q8H    aspirin  81 mg Daily    chlordiazePOXIDE  50 mg BID    chlorhexidine  30 mL BID    folic acid  1 mg Daily    influenza vaccine >=3 yrs  0.5 mL Prior to discharge    LORazepam  4 mg Q4H    metoprolol tartrate  25 mg Q12H    metronidazole (FLAGYL) IVPB  500 mg Q8H NR    pantoprazole  40 mg Daily    QUEtiapine  150 mg Q8H    thiamine  100 mg Daily    vancomycin  500 mg Q6H NR      dextrose-sodium chloride 5%-0.45%      fentaNYL 150 mcg/hr (07/18/16 1800)    heparin (porcine) 1,700 Units/hr (07/18/16 1851)    LORazepam Stopped (07/16/16 1200)    propofol 20 mcg/kg/min (07/18/16 1800)    sodium chloride 10 mL/hr at 07/17/16 2000      fentaNYL  25 mcg Q5 Min PRN    fentaNYL  25 mcg Q2H PRN    LORazepam  1 mg Q5 Min PRN    LORazepam  1 mg Q2H PRN    LORazepam  1.5 mg Q30 Min PRN    Or    LORazepam  3 mg Q30 Min PRN    ondansetron  4 mg Q6H PRN    propofol  30 mg Q3 Min PRN    propofol  30 mg Q2H PRN    sodium chloride (PF)  10 mL PRN    sodium chloride (PF)  20 mL PRN    sodium chloride (PF)  50 mL Once PRN       Review of Systems:   Nutrition: Tube Feed Adult Impact Peptide 1.5 Cal; Adult Tube Feed Route: Nasogastric Feeding Tube; Water Flush Volume: 25 mL; Water Flush Frequency: 4 hr; Continuous or Intermittent: Continuous; Initial rate (mL/hour): 20; Rate Increase (mL/hour): 20; Rate ...   Gastrointestinal: Diarrhea  Mobility:  Non  Pain: Data Unavailable    Vital Signs:  Temperature:  [99 F (37.2 C)-103.1 F (39.5 C)] 99.7 F (37.6 C) (12/13 1900)  Blood pressure (BP): (112-152)/(54-84) 121/77 (12/13 1900)  Heart Rate:  [99-130] 114 (12/13 1900)  Respirations:  [9-23] 13 (12/13 1900)  Pain Score: CPOT (12/13 1800)  O2 Device: ETT (12/13 1800)  SpO2:  [97 %-100 %] 100 % (12/13 1900)   RASS Score: Drowsy    Wt Readings from Last 1 Encounters:   07/15/16 101 kg (222 lb 10.6 oz)       Intake/Output (Current Shift):  12/12 0600 - 12/13 0559  In: 2921.2 [I.V.:1596.2]  Out: 4140 [Urine:3440]    Physical Exam:  General Appearance: sedated, intubated  Heart: Sinus tachycardia .  Lungs: bilateral breath sounds present. ETT in place .  Abdomen: soft, distended. nonperitoneal   Extremities: RLE: dopplerable PT and DP signals present  LLE: wound without erythema. Intact. Dressing in place. Dopplerable AT signal.     Diagnostic Data:  Laboratory data:   Lab Results   Component Value Date    K 3.5 07/18/2016    CL 106 07/18/2016    BICARB 30.2 (H) 07/18/2016    BUN 10 07/18/2016  CREAT 0.8 07/18/2016    GLU 133 (H) 07/18/2016    [place] 7.9 (L) 07/18/2016     Lab Results   Component Value Date    HGB 8.4 (L) 07/18/2016    HCT 25.6 (L) 07/18/2016     Lab Results   Component Value Date    HGB 8.4 (L) 07/18/2016    HCT 25.6 (L) 07/18/2016     No results found for: AST, ALT, ALK, TBILI, DBILI, TP, ALB  Lab Results   Component Value Date    PTT 67.9 (H) 07/18/2016     No results found for: ARTPH, ARTPO2, ARTPCO2  No results found for: PH, PO2, PCO2  No results found for: TSH, FREET4, T3  No results found for: CPK, [facility], TROPONIN  No results found for: CPK, [place], TROPONIN  No results found for: PHUA, SGUA, GLUCOSEUA, KETONEUA, BLOODUA, [place], LEUKESTUA, NITRITEUA, [place], RBCUA, EPITHCELLSUA, CRYSTALSUA, COMMENTSUA      Assessment and Plan:    ASSESSMENT / PLAN:  63 year oldmale admitted with 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 Patient underwent attempted Popliteal-posterior tibial artery bypass on 12/1 with resulting graft failure and reoperation on 12/2 and underwent Popliteal to peroneal bypass with jump graft from peroneal artery to posterior tibial artery - this graft has subsequently gone down as well.      Postoperatively he developed acute alcohol withdrawal due to his undisclosed alcohol abuse. Due to his clinical decline and the need for further imaging studies he was intubated and sedated which has been controlling his agitation fairly well.     He currently has a active c diff infectionwithout signs toxic megacolon. WBC downtrending      Neuro: Fent/prop wean to sedation. CIWA  CV: Tachycardic due to infection and alcoholic withdrawal, improving  Pulm: acute respiratory failure intubated and sedated minimal vent settings wean vent as tolerated extubation per critical care team when safe. Evaluate for extubation in AM  GI: Keofeed in place active C diff colitis,FEEDS at goal  Renal: Normal creatinine continue Foley catheter for critical illness and close urine monitoring. rhabdomyolysis has resolved  Heme:acute blood loss anemia - some element of dilutional component given no active signs of bleeding,transfuse 1 unit pRBC YESTERDAY continue heparin drip  ID: C diff colitis -continue vancomycin and Flagyl, afeb, wbc down-trending. May need to consider colonic lavage if acutely decompensates will need emergent colectomy  Endocrine: ISS  Disposition critical care      Note Author: Gwenevere Ghazi, DO  07/18/16, 7:39 PM    I attest that I had personally examined Tyrin Herbers during this encounter on 07/18/2016 and have reviewed all of the available diagnostic studies along with the resident physician.  IJob Founds, MD, agree with the recorded history, physical examination and evaluation above and have supervised formulation of the stated impression, recommendations and care plan. Alcohol withdrawal is suspected and is now a primary management priority along with management of C difficile colitis.    Job Founds, MD  07/22/2016 9:07 PM

## 2016-07-18 NOTE — Plan of Care (Signed)
Problem: Promotion of health and safety  Goal: Promotion of Health and Safety  The patient remains safe, receives appropriate treatment and achieves optimal outcomes (physically, psychosocially, and spiritually) within the limitations of the disease process by discharge.   Outcome: Unable to meet goal at this time.    Nursing Shift Summary    Provider Notification for the past 12 hrs:   Provider Name Reason for Communication Provider Response   07/18/16 0030 Dr Malva Coganellez pt febrile - 38.6C acknowledged   07/18/16 0530 Dr Tomi BambergerMayers MD at bedside to assess pt. made aware of pt temp max 39.5C.  -       No data found.      No data found.    No data found.

## 2016-07-18 NOTE — Progress Notes (Signed)
Progress Note  Surgical Intensive Care Unit    Patient Name: Kilian Schwartz  MRN: [id number]     Admitting Service: [place] Attending Provider: Rae Halsted    Mechanism of Injury: <principal problem not specified>    Date: 07/18/16  Hospital Day:   12 days - Admitted on: 07/06/2016    Injuries/Problems: Problem List:  2017-12: Peripheral vascular disease (CMS-HCC)  2017-11: Peripheral artery disease (CMS-HCC)  Clostridium difficile colitis  Alcoholism /alcohol abuse (CMS-HCC)  Alcohol withdrawal delirium, acute, hyperactive (CMS-HCC)    OBJECTIVE:     Vitals signs:     Latest Entry  Range (last 24 hours)    Temperature: 101.3 F (38.5 C)  Temp  Avg: 101.2 F (38.4 C)  Min: 99.7 F (37.6 C)  Max: 103.1 F (39.5 C)    Blood pressure (BP): 133/71  BP  Min: 112/54  Max: 152/72    Heart Rate: 121  Pulse  Avg: 117.6  Min: 109  Max: 130    Respirations: 21  Resp  Avg: 14.7  Min: 9  Max: 23    SpO2: 97 %  SpO2  Avg: 99.8 %  Min: 97 %  Max: 100 %       No Data Recorded       No Data Recorded       No Data Recorded     Weight: 101 kg (222 lb 10.6 oz)  Percentage Weight Change (%): -0.39 %    I&O:  Intake/Output       07/17/16 0600 - 07/18/16 0559 07/18/16 0600 - 07/19/16 0559      7253-6644 0347-4259 Total 0600-1759 5638-7564 Total       Intake    I.V.  1134.8  461.4 1596.2  84.4  -- 84.4    NG/GT  795  530 1325  --  -- --    Total Intake 1929.8 991.4 2921.2 84.4 -- 84.4       Output    Urine  2150  1290 3440  --  -- --    Stool  500  200 700  --  -- --    Total Output 2650 1490 4140 -- -- --       Net I/O     -720.2 -498.6 -1218.8 84.4 -- 84.4            Labs:  CBC  Recent Labs      07/17/16   0408  07/18/16   0417   WBCCOUNT  23.0*  21.9*   HGB  8.0*  8.4*   PLCTEL  385  442*   BANDP  28.6   --    LYMPP2  14.3  9.5   MONP2  8.6  7.6          Chemistry  Recent Labs      07/17/16   0408  07/18/16   0417   SODIUM  142  141   K  3.5  3.5   CL  108*  106   CO2  28  31   BUN  7  10   CREAT  0.7  0.8   GLU  113  133*    [place]  8.0*  7.9*   MG  1.8*  1.9   PHOS  2.9  1.7*     No results for input(s): ALK, AST, ALT, TBILI, DBILI, ALB in the last 72 hours.    Coags  Recent Labs      07/17/16   1615  07/18/16   0417   PTT  64.9*  67.9*       Medications:  Scheduled Meds   acetaminophen  975 mg Q8H    aspirin  81 mg Daily    chlordiazePOXIDE  50 mg Q8H    chlorhexidine  30 mL BID    folic acid  1 mg Daily    influenza vaccine >=3 yrs  0.5 mL Prior to discharge    LORazepam  4 mg Q4H    metoprolol tartrate  12.5 mg Q12H    metronidazole (FLAGYL) IVPB  500 mg Q8H NR    pantoprazole  40 mg Daily    QUEtiapine  150 mg Q8H    thiamine  100 mg Daily    vancomycin 500 mg in NS 100 mL rectal enema  500 mg Q6H NR    vancomycin  500 mg Q6H NR     PRN Meds   fentaNYL  25 mcg Q5 Min PRN    fentaNYL  25 mcg Q2H PRN    LORazepam  1 mg Q5 Min PRN    LORazepam  1 mg Q2H PRN    LORazepam  1.5 mg Q30 Min PRN    Or    LORazepam  3 mg Q30 Min PRN    ondansetron  4 mg Q6H PRN    propofol  30 mg Q3 Min PRN    propofol  30 mg Q2H PRN    sodium chloride (PF)  10 mL PRN    sodium chloride (PF)  20 mL PRN    sodium chloride (PF)  50 mL Once PRN     IV Meds   fentaNYL 150 mcg/hr (07/17/16 2000)    heparin (porcine) 1,700 Units/hr (07/18/16 0500)    LORazepam Stopped (07/16/16 1200)    propofol 20 mcg/kg/min (07/18/16 0631)    sodium chloride 10 mL/hr at 07/17/16 2000       Allergies:  Allergies   Allergen Reactions    No Known Allergies [Other] Other     Received name: No Known Allergies       ASSESSMENT / PLAN / RECOMMENDATIONS:  46M with a traumatic left lower extremity injury 3 years prior to presents with pain to left lower extremity concerning for claudication  S/p left popliteal to distal PT bypass  S/p Bypass revision, left popliteal to peroneal and peroneal to PT jump graft    07/06/16  LEFT POPLITEAL TO DISTAL POSTERIOR TIBIAL ARTERY BYPASS LEFT SAPHENOUS VEIN HARVEST AORTOGRAM LEFT LEG ANGIOGRAM LEFT POPLITEAL ARTERY  ANGIOPLASTY (4X4, 5X4) LEFT DISTAL POSTERIOR TIBIAL ARTERY ANGIOPLASTY       07/07/16  Redo Left transinterosseous below-knee popliteal-peroneal and sequential peroneal-distal posterior tibal-medial tarsal reverse saphenous vein bypass; Left distal segmental fibula resection to expose distal peroneal artery,  Thrombectomy of left below-knee popliteal-posterior tibial reverse saphenous vein bypass    Neuro:   GCS 10T  - off ativan and fentanyl 150 gtt.  propofol 20 and wean off ativan  - CIWA for h/o etoh Abuse and withdrawal ativan scale increased  -librium 50 q8hrs,seroquel 127m q8hrs  -aspirin, gabapentin  -tylenol schd, ativan 439mq4hrs  Thiamine folate  -Head CT 12/7 Negative    CV:BP  Min: 112/54  Max: 152/72  Pulse  Avg: 117.6  Min: 109  Max: 130  110s-150s/60s-80s  HR 110s-120s  - metop 12.38m61m6hrs     #LLE claudication s/p bypass   - bypass likely thrombosed again  - arterial duplex 07/08/16: There is  distal arterial flow noted in the posterior tibial artery. The bypass graft is not visualized.Mild stenosis at the left mid-distal SFA.  - ABI with AT on LLE is 0.63 07/08/16  - q1 neurovscular checks  - on heparin gtt 1700  - pulse ox monitor on LLE (currently at 100%)   -DVT Bilt LE + UE [place] negative 07/12/16  -CTA runoff 07/12/16 shows Nonopacified left popliteal to distal posterior tibial artery bypass, suggesting graft failure.    Pulmonary:  Intubated AC/VC 12/500/40%/5   Arterial Blood Gas result:  pH 7.45;  PCO2 45,  pO2 169, HCO3 30, %O2 Sat 99.  SBT not done HR 127  Extubate once weaned off  CT chest 07/12/16 shows no findings              FEN/GI:  KUB 12/7 shows worsening colonic illeus. CT abdomen 07/12/16 shows pan colitis.  Fluids TKO   protonix, Replete electrolytes mag  Tube feeds @ goal  Mg 1.7    Diet:Tube Feed Adult Impact Peptide 1.5 Cal; Adult Tube Feed Route: Nasogastric Feeding Tube; Water Flush Volume: 25 mL; Water Flush Frequency: 4 hr; Continuous or Intermittent: Continuous; Initial rate  (mL/hour): 20; Rate Increase (mL/hour): 20; Rate ...     Renal:     Intake/Output Summary (Last 24 hours) at 07/18/16 0813  Last data filed at 07/18/16 0700   Gross per 24 hour   Intake          2354.33 ml   Output             3665 ml   Net         -1310.67 ml     Stool 700  foley  -f/u Cr 1.3 -> 1.0 -> 1.0 ->0.9 -> 0.7    Hematology: received 1 u PRBC   hb 10.6 -> 8.9 ->8.0 -> 7.0 -> 9.3 ->8.3 ->7.8 ->7.7 ->7.3->6.7 -> 1u ->8.0 ->8.4 ->8.0 -> 8.4  Hg 7.0 received 1 uPRBCs 12/5, 12/10  -no DVT ppx on board  D/c'd central, placed PICC    ID: Temp  Avg: 101.2 F (38.4 C)  Min: 99.7 F (37.6 C)  Max: 103.1 F (39.5 C)  afebrile WBC 17.6 -> 13.3 -> 18.8 -> 23.1 -> 26 ->22 ->23 ->21.9 ; with multiple foul loose stool; c. Diff colitis  Micro: BCx 12/1 NGTD, BC 12/5 NGTD  -UA 12/5 and 12/7 negative   -c.diff positive  -BCx 12/13: pending  -sputum Cx 12/13: 1+ gram neg rods. Doubt VAP as pt has unchanged respiratory status thus will not treat  -UA 12/13: neg  -on vanco enema, oral vanco, iv flagyl  -f/u ID recs  - continue PO Vancomycin 578m q6 hours, and IV Flagyl 5047mq8hours  - can dc the Vancomycin enema as there is no evidence of ileus  - continue supportive measures (fluids, replacement of electrolytes)  - daily abdominal exam/KUB to monitor   - avoid addition of broad-spectrum antibiotics as this may impede improvement of CDI    Endocrine:  FSBS: < 180    MSK:  PT/OT:  No  Weight Bearing Status:  weight bear as tolerated. ABI 0.6. Xray shows right knee effusion  -d/c f/u CPK 4000 -> 5493 -> 4656 ->3747 -> 1746  -f/u Kub for hip       ICU Care:    Feedings: yes  Analgesia: Yes  Sedation: Yes  Thromboprophylaxis:SCDs, early mobility protocol and subcutaneous heparin  Head-of-bed elevation: Yes  Ulcer prophylaxis: no  Glycemic control: no  GI Bowel care: Yes  Indwelling catheter removal: no  ID: de-escalation of Abx: yes  Spontaneous breathing trial: N/A    Family discussion: yes    DISPO: SICU     C.diff colitis, acute respiratory failure, sepsis, DT,   S/p bypass 12/1  Neuro:Delirium tremens. Previously on precedex gtt. Now intubated on ativan gtt weaned off ativan. on librium plan to wean to 50 BID. On fentanyl and propofol. Plan wean to extubation   GCS 10T, RASS +2 -> - 2, CAM -  Seroquel . Folate. Thiamine. MVI. Gaba and tylenol.  Cardio: HDS. Tachycardia 103-130. Increase metoprolol 25 BID.on heparin gtt for graft (most recent PTT 67).   Pulm: acute respiratory failure on mechanical ventilation 40%/5 peep. ABG 7.45/45/169/30. Plan for SBT today. Secretions every hour  GI: Severe cdiff colitis. On oral vanco (500)and IV flagyl started 7th afternoon. Stop vanco enemas today. Stool output 700 significantly improved.WBC stable. wbc 21 and afebrile and no guarding on exam.Now on TF @ goal   Renal: 2.7/3.9 (1.3+) PLan to tko fluids since stool output down. previously on rhabdo protocol for rising CK now resolved  ID: 39.5. Blood and urine. Sputum sent but doubt PNA given no cxr findings and no change in pulmonary status   cdiff colitis on oral + rectal vanco and IV flagyl. Oral vanco increased to 500 per ID + monitor abd exam. Plan finish course of PO vanco/IV flagyl x14d   Heme: acute blood loss anemia hgb stable 8.0 from 8.4   Endo: FSBS <180   proph: heparin gtt for graft.  Vasc: Popliteal-posterior tibial artery bypass on 12/1 with resulting graft failure and reoperation on 12/2 and underwent Popliteal to peroneal bypass with jump graft from peroneal artery to posterior tibial artery - this graft has subsequently gone down as well. has collaterals and flow to the foot via AT   Lines/tubes/drains:   PICC line   foley   aline       SICU Attending Attestation:  07/18/16 3:35 PM  Remained in SICU overnight secondary to above diagnoses and due to concern for possible sudden deterioration. Patient met criteria for critical illness and ICU level of care during my evaluation and treatment due to  the following diagnoses and/or management issues:    1. Altered mental status/deliurium - improved  2. Sinus tachycardia - increase metoprolol  3. Acute hypoxic/hypercarbic respiratory failure. Tolerating vent wean, but still requiring frequent suctioning  4. C-diff colitis - improving. Continue PO vanco + IV flagyl. D/C vanco enemas.    Critical Care Time = 33 minutes today, excluding teaching and procedures.    Charyl Bigger Shunna Mikaelian MD  [facility] Clinical Professor  Department of Surgery

## 2016-07-18 NOTE — Plan of Care (Signed)
Problem: Promotion of health and safety  Goal: Promotion of Health and Safety  The patient remains safe, receives appropriate treatment and achieves optimal outcomes (physically, psychosocially, and spiritually) within the limitations of the disease process by discharge.   Nursing Shift Summary    No data found.    No data found.      Rounding for the past 12 hrs:   Physician Rounding   07/18/16 1900 My nursing colleague or I joined the physician during  his/her interaction/rounds with the patient at least once during my shift       No data found.

## 2016-07-18 NOTE — Progress Notes (Signed)
Inpatient Heparin Drug Monitoring Note    Jerome Hunt is a 40 year old male on anticoagulation therapy for lower extremity ischemia.    Past Medical History:  Past Medical History:   Diagnosis Date    Alcohol withdrawal delirium, acute, hyperactive (CMS-HCC)     Alcoholism /alcohol abuse (CMS-HCC)     Clostridium difficile colitis        Allergies:  Allergies   Allergen Reactions    No Known Allergies [Other] Other     Received name: No Known Allergies       Medications:    Current Inpatient Medications:   acetaminophen  975 mg Q8H    aspirin  81 mg Daily    chlordiazePOXIDE  50 mg Q8H    chlorhexidine  30 mL BID    folic acid  1 mg Daily    influenza vaccine >=3 yrs  0.5 mL Prior to discharge    LORazepam  4 mg Q4H    metoprolol tartrate  12.5 mg Q12H    metronidazole (FLAGYL) IVPB  500 mg Q8H NR    pantoprazole  40 mg Daily    QUEtiapine  150 mg Q8H    thiamine  100 mg Daily    vancomycin 500 mg in NS 100 mL rectal enema  500 mg Q6H NR    vancomycin  500 mg Q6H NR      fentaNYL 150 mcg/hr (07/17/16 2000)    heparin (porcine) 1,700 Units/hr (07/18/16 0500)    LORazepam Stopped (07/16/16 1200)    propofol 20 mcg/kg/min (07/18/16 0631)    sodium chloride 10 mL/hr at 07/17/16 2000       aPTT Goal:   65 - 90 seconds    Dosing weight = 91 kg     Current Heparin Rate:     Dose (Units/hr) Heparin: 1700 Units/hr    Labs:    Coags:  Recent Labs      07/15/16   2106  07/16/16   0520  07/16/16   1252  07/16/16   2028  07/17/16   0408  07/17/16   1615  07/18/16   0417   PTT  113.7*  51.3*  75.6*  69.4*  71.7*  64.9*  67.9*       Labs:  (Last 3 days):    Recent Labs      07/16/16   0520  07/17/16   0408  07/18/16   0417   HGB  8.4*  8.0*  8.4*   HCT  25.1*  24.0*  25.6*   CREAT  0.9  0.7  0.8       Drug/Disease Interactions and Assessments:    - Bleeding Assessment:  H/H is stable. Patient does not exhibit any signs or symptoms of bleeding (spoke to RN).     - Current Nutrition Assessment:    Recent changes in nutrition/diet? no  Tube Feed Adult Impact Peptide 1.5 Cal; Adult Tube Feed Route: Nasogastric Feeding Tube; Water Flush Volume: 25 mL; Water Flush Frequency: 4 hr; Continuous or Intermittent: Continuous; Initial rate (mL/hour): 20; Rate Increase (mL/hour): 20; Rate ...    Assessment/Plan:  - aPTT= 67.9 seconds is within therapeutic goal range x48 hours  - Will continue heparin maintenance infusion at 1700 units/hour   - Check aPTT every 24hours, to be drawn at 04:00 on 12/14  - Continue to monitor for signs and symptoms of bleeding  - Patient does not have IM medications ordered. If given use small needle  and apply pressure to injection site for 2 minutes.    Pharmacy will continue to monitor closely. For questions please call [phone]    Frutoso SchatzKathleen Thai Ansel BongBuu Datha Kissinger, [phone] Veterans Affairs Medical CenterHARMD

## 2016-07-19 ENCOUNTER — Inpatient Hospital Stay: Payer: No Typology Code available for payment source

## 2016-07-19 LAB — CBC WITH DIFF, BLOOD
Atypical Lymphocytes %: 2.9 %
Atypical Lymphocytes Absolute: 0.6 10*3/uL — ABNORMAL HIGH (ref 0.0–0.5)
Bands % (M): 3.9 %
Bands Abs (M): 0.7 10*3/uL — ABNORMAL HIGH (ref 0.0–0.6)
Basophils %: 0 %
Basophils Absolute: 0 10*3/uL (ref 0.0–0.2)
Eosinophils %: 0 %
Eosinophils Absolute: 0 10*3/uL (ref 0.0–0.5)
Hematocrit: 22.9 % — ABNORMAL LOW (ref 39.5–50.0)
Hgb: 7.6 G/DL — ABNORMAL LOW (ref 13.5–16.9)
Lymphocytes %.: 6.7 %
Lymphocytes Absolute: 1.3 10*3/uL (ref 0.9–3.3)
MCH: 31.2 PG (ref 27.0–33.5)
MCHC: 33.2 G/DL (ref 32.0–35.5)
MCV: 93.7 FL (ref 81.5–97.0)
MPV: 9.2 FL (ref 7.2–11.7)
Metamyelocytes %: 1.9 %
Metamyelocytes Absolute: 0.4 10*3/uL — ABNORMAL HIGH
Monocytes %: 6.7 %
Monocytes Absolute: 1.3 10*3/uL — ABNORMAL HIGH (ref 0.0–0.8)
PLT Count: 447 10*3/uL — ABNORMAL HIGH (ref 150–400)
RBC: 2.44 10*6/uL — ABNORMAL LOW (ref 3.70–5.00)
RDW-CV: 15.4 % — ABNORMAL HIGH (ref 11.6–14.4)
Seg Neutro % (M): 77.9 %
Seg Neutro Abs (M): 15 10*3/uL — ABNORMAL HIGH (ref 2.0–7.5)
White Bld Cell Count: 19.2 10*3/uL — ABNORMAL HIGH (ref 4.0–10.5)

## 2016-07-19 LAB — BASIC METABOLIC PANEL, BLOOD
BUN: 10 mg/dL (ref 7–25)
CO2: 31 mmol/L (ref 21–31)
Calcium: 8 mg/dL — ABNORMAL LOW (ref 8.6–10.3)
Chloride: 103 mmol/L (ref 98–107)
Creat: 0.8 mg/dL (ref 0.7–1.3)
Electrolyte Balance: 5 mmol/L (ref 2–12)
Glucose: 104 mg/dL (ref 70–115)
Potassium: 3.2 mmol/L — ABNORMAL LOW (ref 3.5–5.1)
Sodium: 139 mmol/L (ref 136–145)
eGFR - high estimate: >60 (ref >59)
eGFR - low estimate: >60 (ref >59)

## 2016-07-19 LAB — ARTERIAL BLOOD GAS
Base Excess: 8.1 mmol/L
HCO3: 32.6 mmol/L — ABNORMAL HIGH (ref 21.0–27.0)
Inspired O2: 0.4
O2 Sat: 98.1 % (ref 95.0–99.0)
Patient Temp: 37.5 DEG C
pCO2 (Temp Adjusted): 47.4 MMHG — ABNORMAL HIGH (ref 36.0–42.0)
pH (Temp Adjusted): 7.46 — ABNORMAL HIGH (ref 7.38–7.42)
pO2 (Temp Adjusted): 119.2 MMHG — ABNORMAL HIGH (ref 80.0–104.0)

## 2016-07-19 LAB — THROMBOPHILIA PANEL, VENOUS
Antithrombin III Level: 57 % — ABNORMAL LOW (ref 80–120)
Factor V Leiden: NORMAL
Free Protein S Antigen: 98 % (ref 70–148)
Lupus Anticoag HEX: NEGATIVE
Lupus Anticoagulant DRVVT: NEGATIVE
Lupus Sens  aPTT Scrn: 36 s (ref <45.7)
Protein C Activity: 62 % — ABNORMAL LOW (ref 70–130)
Prothrombin 20210a: NORMAL

## 2016-07-19 LAB — APTT, BLOOD: PTT: 79.5 s — ABNORMAL HIGH (ref 24.1–36.3)

## 2016-07-19 LAB — MAGNESIUM, BLOOD: Magnesium: 1.9 mg/dL (ref 1.9–2.7)

## 2016-07-19 LAB — PHOSPHORUS, BLOOD: Phosphorus: 2.6 MG/DL (ref 2.5–5.0)

## 2016-07-19 MED ORDER — CHLORDIAZEPOXIDE HCL 25 MG OR CAPS
25.0000 mg | ORAL_CAPSULE | Freq: Two times a day (BID) | ORAL | Status: DC
Start: 2016-07-19 — End: 2016-07-21
  Administered 2016-07-19 – 2016-07-21 (×5): 25 mg via ORAL
  Filled 2016-07-19 (×4): qty 1

## 2016-07-19 MED ORDER — VANCOMYCIN HCL 500 MG IV SOLR
500.0000 mg | Freq: Four times a day (QID) | INTRAVENOUS | Status: DC
Start: 2016-07-19 — End: 2016-07-23
  Administered 2016-07-19 – 2016-07-23 (×17): 500 mg via RECTAL
  Filled 2016-07-19 (×29): qty 500

## 2016-07-19 MED ORDER — LORAZEPAM 2 MG/ML IJ SOLN
4.0000 mg | Freq: Four times a day (QID) | INTRAMUSCULAR | Status: DC
Start: 2016-07-19 — End: 2016-07-20
  Administered 2016-07-19 (×2): 4 mg via INTRAVENOUS

## 2016-07-19 MED ORDER — HYDROMORPHONE HCL 2 MG/ML IJ SOLN
1.0000 mg | INTRAMUSCULAR | Status: DC | PRN
Start: 2016-07-19 — End: 2016-07-20
  Administered 2016-07-19 – 2016-07-20 (×4): 1 mg via INTRAVENOUS
  Filled 2016-07-19 (×3): qty 1

## 2016-07-19 MED ORDER — SODIUM CHLORIDE 0.9 % IV SOLN
40.00 meq | Freq: Once | INTRAVENOUS | Status: AC
Start: 2016-07-19 — End: 2016-07-19
  Administered 2016-07-19 (×2): 40 meq via INTRAVENOUS
  Filled 2016-07-19: qty 20

## 2016-07-19 NOTE — Plan of Care (Signed)
Problem: Promotion of health and safety  Goal: Promotion of Health and Safety  The patient remains safe, receives appropriate treatment and achieves optimal outcomes (physically, psychosocially, and spiritually) within the limitations of the disease process by discharge.   Outcome: Unable to meet goal at this time.   07/19/16 0537   Adult/Peds Plan of Care   Standard of Care/Policy [facility]   Outcome Evaluation (rationale for progressing/not progessing Pt remains intubated and sedated with plan of extubation in AM.   Patient Specific Goal, Promotion of Health & Safety Frequent suctioning.   Additional Individualized Interventions/Recommendations  Q1hr dopplers of BLE and Q4hr neuro checks.   Nursing Shift Summary    Provider Notification for the past 12 hrs:   Provider Name Reason for Communication Provider Response   07/18/16 1925 Dr. Malva Coganellez KFT not working. Unable to give PO meds. Hold feed.   07/19/16 0446 Tellez Need PO vanco switched to vanco enema. -       No data found.      Rounding for the past 12 hrs:   Physician Rounding   07/18/16 1900 My nursing colleague or I joined the physician during  his/her interaction/rounds with the patient at least once during my shift       No data found.

## 2016-07-19 NOTE — Plan of Care (Signed)
Problem: Promotion of health and safety  Goal: Promotion of Health and Safety  The patient remains safe, receives appropriate treatment and achieves optimal outcomes (physically, psychosocially, and spiritually) within the limitations of the disease process by discharge.   Outcome: Progressing toward goal, anticipate improvement over: next 12-24 hours   07/17/16 1656 07/19/16 0537 07/19/16 1708   Adult/Peds Plan of Care   Standard of Care/Policy --  --  Critical Care SOC;Skin SOC;Restraints SOC;Falls reduction   Outcome Evaluation (rationale for progressing/not progessing --  Pt remains intubated and sedated with plan of extubation in AM. --    Patient Specific Goal, Promotion of Health & Safety --  Frequent suctioning. --    Additional Individualized Interventions/Recommendations  --  Q1hr dopplers of BLE and Q4hr neuro checks. --    Additional Individualized Interventions/Recommendations + Doppler signals to Pedal Pulse. Continue Q1hr Doppler checks to BLE. --  --    Additional Individualized Interventions/Recommendations Wife=Rachel at bedside for support. --  --      Nursing Shift Summary.    Shift Comments for the past 12 hrs:   Comments   07/19/16 1100 patient extubated   07/19/16 1500 sicu afternoon rounds. aware temp is 38.5   07/19/16 1546 swallow eval     1700-  Patient passed swallow eval tolerating pain with dilaudid 1mg  prn ivp              Still remains confused to situation and time

## 2016-07-19 NOTE — Interdisciplinary (Signed)
Social Work Assessment, Adult        Patient Name:  Jerome Hunt   MRN: [id number]   Date of Birth: 26-Aug-1975    Age: 40 year old   Date of Admission:  07/06/2016         Service Date: July 19, 2016     Assessment  Assessment Type: Progress/Follow-up   Referral Information  Referral Type: Other (Comment) (Drug/Alcohol Counseling )     CSW for pt follow up support and resource needs.     CSW responding to SW consult re: drug/alcohol counseling.     CSW conferred with RNCM Marlowe Kays and reports pt with anticipated DCP: SNF, when medically stable. Per bedside RN, pt extubated this AM, wife at bedside.     CSW met with pt and wife at bedside, introduced self/role. Upon assessment, pt tearful; wife states pt is minimally confused and MD team extubated about one hour prior. Pt and wife receptive to [place] visit. CSW inquired of living situation and support system. Per wife, pt and herself live in [facility]. Wife states all of pt's family live in the [place] and has 8 children from previous relationship; she has 5 children from previous relationship. Wife reports pt has support from her family here in [place] and from his children. Wife reports pt and herself do not have children together. Wife reports to be coping WNL, states, "I have my moments but I have been here for him." CSW normalized wife's emotions/thoughts and provided active listening and support.     CSW inquired of above referral. Per wife, she was unaware of pt's drug use until MD team relayed information. Wife reports was only aware of his alcohol use. Pt stating, "I stopped on my own." Wife provided support to pt, "And you can stop this time too." Pt smiling and agreeing when hearing of this. Wife stating, "these past couple of months have been hard for [place]." CSW acknowledged and validated pt's positive self worth and awareness to manage detox on his own. Pt denied using any rehab/detox programs.  Per  wife request, would like PTSD resources. 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 Wife reports he received some mental health therapy from the [place] post [facility], however, states believes he will need additional follow up. Pt agreeable to these resources in nodding his head, "yes."     CSW clarified able to revisit the Mental Health resources and further explain with pt once pt is more medically stable. Pt and wife agreeable to this; wife stating pt is still confused. During assessment, pt becoming less tearful and very pleasant during CSW visit.     CSW will continue to follow during inpatient stay. CSW to remain available should needs arise. Almyra Deforest, MSW p [phone]                                                                                                 Discharge Information:                Lonna Duval  Norma Fredrickson, MSW     Date: 07/19/2016    Time: 12:28 PM

## 2016-07-19 NOTE — Interdisciplinary (Signed)
NUTRITION NOTE      Patient Name:  Jerome Hunt  MRN: [id number]  Date of Birth: 1976/08/01  Age: 40 year old  Date of Admission:  07/06/2016  Service Date: July 19, 2016  Evaluation Type: Progress  Source of Referral: Routine F/U   Recommended Tube Feeding   Tube Feeding Formula Type: Impact Peptide 1.5 Cal  Formula (cal/ml): 1.5 cal/ml  Formula Protein (gms): 94  Feeding Type: continuous  Goal Rate (ml/hour): 65  Hours/day: 24  Amount (ml/day): 1560  Recommended Tube Feeding Total Kcals: 2340  Recommended Tube Feeding Total Protein (gms): 146.64    Additional Recommendations to Physician: If alt nutrition required(i.e. TPN) please consult nutrition    Jerome Hunt is a 40 year old male, admitted on [id number] with a diagnosis of has Peripheral artery disease (CMS-HCC); Peripheral vascular disease (CMS-HCC); Clostridium difficile colitis; Alcoholism /alcohol abuse (CMS-HCC); and Alcohol withdrawal delirium, acute, hyperactive (CMS-HCC) on his problem list..  Per Surg Fellow note today:  80M with a traumatic left lower extremity injury 3 years prior to presents with pain to left lower extremity concerning for claudication  07/06/16  LEFT POPLITEAL TO DISTAL POSTERIOR TIBIAL ARTERY BYPASS LEFT SAPHENOUS VEIN HARVEST AORTOGRAM LEFT LEG ANGIOGRAM LEFT POPLITEAL ARTERY ANGIOPLASTY (4X4, 5X4) LEFT DISTAL POSTERIOR TIBIAL ARTERY ANGIOPLASTY     07/07/16  Redo Left transinterosseous below-knee popliteal-peroneal and sequential peroneal-distal posterior tibal-medial tarsal reverse saphenous vein bypass; Left distal segmental fibula resection to expose distal peroneal artery,  Thrombectomy of left below-knee popliteal-posterior tibial reverse saphenous vein bypass  FEN/GI:  KUB 12/7 shows worsening colonic illeus. CT abdomen 07/12/16 shows pan colitis.  Fluids TKO   protonix, Replete electrolytes mag  NPO   Subjective: Intubated, currently no TF. Per RN keofeed tube is out.   Assessment: Per surg note today, pt worsening colonic illness. Pt is npo currently. May need TPN if unable to TF?   Nutrition Related Findings  Nutrition Subjective : Spoke with nursing  Physical Findings: Intubated  Current Diet order: Impact Peptide 1.5 @ 65m/hr  Current Nutrition Regimen Provides: TF @ goal provides 2160kcal and 135g of protein  Bowel Function: Rectal Tube (100 ml)  Lab Review:    Medication Review:   Intake/Output Summary (Last 24 hours) at 07/19/16 1127  Last data filed at 07/19/16 1106   Gross per 24 hour   Intake          3676.63 ml   Output             2395 ml   Net          1281.63 ml     Lab Review:  (Reviewed)  Medication Review Comments:  (reviewed)    dextrose-sodium chloride 5%-0.45%    heparin (porcine)    sodium chloride  Allergies: No known allergies [other]    Pertinent Labs  BMP  Lab Results   Component Value Date    SODIUM 139 07/19/2016    K 3.2 (L) 07/19/2016    CL 103 07/19/2016    BUN 10 07/19/2016    CREAT 0.8 07/19/2016    GLU 104 07/19/2016    [place] 8.0 (L) 07/19/2016     Lab Results   Component Value Date    MG 1.9 07/19/2016    PHOS 2.6 07/19/2016     Nutrition Labs  Lab Results   Component Value Date    ALB 2.6 07/14/2016     No results found for: PREALB  No results found for:  CRP  Lipid Panel  Lab Results   Component Value Date    CHOL 237 05/05/2016    LDLCALC 152 05/05/2016    TRIG 139 05/05/2016     A1C  No results found for: A1C    Anthropometrics  )  Ht Readings from Last 1 Encounters:   07/06/16 _0  (1.905 m)        Wt Readings from Last 1 Encounters:   07/15/16 101 kg (222 lb 10.6 oz)      Body mass index is 27.83 kg/(m^2).    Nutrition Diagnosis   Nutrition Diagnosis 1: Increased Nutrient Needs  Related To: Increased nutrient needs due to catabolic illness  As Evidenced By: Conditions associated with medical Dx/Tx  Status: Ongoing    Estimated Needs  Weight Used for Total Estimated Needs (kg): 99.5 kg  Min Total Caloric Estimated Needs (kcals/day): 2487.5   Max Total Caloric Estimated Needs (kcals/day): 2985  Min Total Protein Estimated Needs (g/day): 149.25  Max Total Protein Estimated Needs (g/day): 199      Monitoring and Evaluation  Nutrition Goals  Nutritional needs met via alternative route: Not Progressing  Follow up date: 07/23/16  Follow up Communication: Very High risk      Name: Harley Hallmark, RD     Date: 07/19/2016    Time: 11:27 AM

## 2016-07-19 NOTE — Interdisciplinary (Signed)
Speech Language Pathology Evaluation    Start of Care: 07/19/16  Onset date : 07/06/16  Reason for referral: Swallow Impairment     Preferred Language:English    ST RECOMMENDATIONS:   1) Dysphagia III diet and thin liquids, supervised feedings  2) Crush meds if possible  3) Determine baseline cognitive/communication status; recommend Speech eval if presenting w/ a decline in function.     Subjective  Awake and slightly confused; impulsive.     Evaluation Type: (P) Swallow    Patient History   Contact Time: 1530  Onset date (this episode): 07/06/16  Patient history: Pt is a 40 y/o male who [facility] for c/o pain in left lower extremity that was injured 3 years ago; concern for claudication. 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    Living situation: Lives with family  Type of evaluation: 92610 Evaluation of oral & pharyngeal swallowing function  Pain Rating (0-10): 0  Vision and Hearing: WFL (for swallow eval)  Respiratory Status : Nasal cannula (extubated earlier this morning)  Level of Alertness: Delayed response to stimuli;Confused;Impulsive;Inappropriate;Distractible (bilateral safety restraints in place)  Vocal Quality : Fair;Asthenia     Temperature: 101.1 F (38.4 C) (07/19/16 1600)  No results found for: WBC    Oral Motor Evaluation  Oral Motor Evaluation  Facial Muscles: Intact bilaterally  Dentition: Within normal limits  Mucosal Quality : Adequate  Lips: Normal  Tongue movement: Normal protrusion;Normal lingual sensation (slow movement suspected to be d/t cognitive status)  Jaw movement: Normal  Jaw/lingual Differentiation: WNL  Soft palate: Elevates symmetrically  Vocal Quality : Fair;Asthenia        Clinical Bedside Swallow Evaluation  Clinical Swallow Evaluation  Dysphagia Symptoms : Other( comments) (recent extubation)  Current diet: NPO   Oral Secretions: Normal  Respirations: Requires O2  Food/liquid administered during evaluation: Ice chips by spoon;Water by spoon;Water by cup;Water by straw;Thickened liquid by spoon;Thickened liquid by cup;Thickened liquid by straw;Pureed food by spoon;Other (see comments) (dysphagia III foods )  Lip seal: Within normal limits  Oral Prep : Other  (comments) (slow/delayed for dysphagia III)  Mastication: Intact  Swallow Initiation: Other (comments) (timely)  Hyolaryngeal elevation: Intact  Oral stasis: No  Swallow efficiency: No overt symptoms of laryngeal penetration/aspiration  Problems:  (increased mastication time due to cognitive status,)  Clinical Exam Recommendations: Initiate PO diet with texture modifications/recommendation  Aspiration Precautions : Sit upright while eating and drinking;Monitor for signs of aspiration;Slow rate of presentation;Small sips/bites;Alternate between foods and liquids;Maintain upright posture after eating for 30 minutes;One to one supervision;Other (meds in applesauce)  Diet recommendations: Dysphagia 3 - chopped (thin liquids)    Assessment   Pt presents w/ mild oral phase dysphagia and suspected functional pharyngeal phase of swallow.  No overt s/s of aspiration noted at this time.  Demonstrates risk for suboptimal intake due to slow/delayed mastication process on chopped foods; delay is suspected to be secondary to impaired cognitive status vs weakness.  Ongoing assessment w/ continued ST tx for dysphagia indicated.     Functional Limitation Reporting   G-Codes  Functional Assessment Tool Used: (P) clinical assessment  Functional Limitations: (P) Swallowing  Swallow Current Status: CJ  Swallow goal status: CH    Plan/Recommendations  Plan/Recommendations  Aspiration Precautions : Sit upright while eating and drinking;Monitor for signs of aspiration;Slow rate of presentation;Small sips/bites;Alternate between  foods and liquids;Maintain upright posture after eating for 30  minutes;One to one supervision;Other (meds in applesauce)        Plan  The goals listed below are achievable and realistic within the designated time frame and the treatments listed below, and referred to in the treatment plan, are medically necessary to achieve these goals. The functional goals were created based on the patient's prior level of function.    Goals                                                                                        Long Term Goal: Pt will tolerate most lenient PO diet of regular foods and thin liquids safely w/ reduced risk for aspiration. Target Date: 08/19/2016     Short Term Goals:  Pt to demonstrate timely mastication and AP transfer of regular solid foods in 5/5x, w/o overt s/s of aspiration.  Target Date: 12/20; 5 visits,   Status: New                                                    Evaluation time: 60 minutes

## 2016-07-19 NOTE — Progress Notes (Signed)
Progress Note  Surgical Intensive Care Unit    Patient Name: Jerome Hunt  MRN: [id number]     Admitting Service: [place] Attending Provider: Rae Halsted    Mechanism of Injury: <principal problem not specified>    Date: 07/19/16  Hospital Day:   13 days - Admitted on: 07/06/2016    Injuries/Problems: Problem List:  2017-12: Peripheral vascular disease (CMS-HCC)  2017-11: Peripheral artery disease (CMS-HCC)  Clostridium difficile colitis  Alcoholism /alcohol abuse (CMS-HCC)  Alcohol withdrawal delirium, acute, hyperactive (CMS-HCC)    OBJECTIVE:     Vitals signs:     Latest Entry  Range (last 24 hours)    Temperature: 99.3 F (37.4 C)  Temp  Avg: 99.6 F (37.6 C)  Min: 98.8 F (37.1 C)  Max: 100.8 F (38.2 C)    Blood pressure (BP): 119/69  BP  Min: 107/55  Max: 153/71    Heart Rate: 105  Pulse  Avg: 110.1  Min: 99  Max: 123    Respirations: 24  Resp  Avg: 18  Min: 12  Max: 24    SpO2: 100 %  SpO2  Avg: 99.5 %  Min: 97 %  Max: 100 %       No Data Recorded       No Data Recorded       No Data Recorded     Weight: 101 kg (222 lb 10.6 oz)  Percentage Weight Change (%): -0.39 %    I&O:  Intake/Output       07/18/16 0600 - 07/19/16 0559 07/19/16 0600 - 07/20/16 0559      1517-6160 1800-0559 Total 0600-1759 7371-0626 Total       Intake    I.V.  294.6  1978.6 2273.2  132.6  -- 132.6    NG/GT  1430  130 1560  --  -- --    Total Intake 1724.6 2108.6 3833.2 132.6 -- 132.6       Output    Urine  1275  970 2245  125  -- 125    Emesis/NG Output  --  0 0  --  -- --    Stool  --  200 200  --  -- --    Total Output 1275 1170 2445 125 -- 125       Net I/O     449.6 938.6 1388.2 7.6 -- 7.6            Labs:  CBC  Recent Labs      07/17/16   0408  07/18/16   0417  07/19/16   0424   WBCCOUNT  23.0*  21.9*  19.2*   HGB  8.0*  8.4*  7.6*   PLCTEL  385  442*  447*   BANDP  28.6   --   3.9   LYMPP2  14.3  9.5  6.7   MONP2  8.6  7.6  6.7          Chemistry  Recent Labs      07/18/16   0417  07/19/16   0424   SODIUM  141  139    K  3.5  3.2*   CL  106  103   CO2  31  31   BUN  10  10   CREAT  0.8  0.8   GLU  133*  104   CA  7.9*  8.0*   MG  1.9  1.9   PHOS  1.7*  2.6  No results for input(s): ALK, AST, ALT, TBILI, DBILI, ALB in the last 72 hours.    Coags  Recent Labs      07/18/16   0417  07/19/16   0424   PTT  67.9*  79.5*       Medications:  Scheduled Meds   acetaminophen  975 mg Q8H NR    aspirin  81 mg Daily    chlordiazePOXIDE  50 mg BID    chlorhexidine  30 mL BID    folic acid  1 mg Daily    influenza vaccine >=3 yrs  0.5 mL Prior to discharge    LORazepam  4 mg Q4H    metoprolol tartrate  25 mg Q12H    metronidazole (FLAGYL) IVPB  500 mg Q8H NR    pantoprazole  40 mg Daily    QUEtiapine  150 mg Q8H    thiamine  100 mg Daily    vancomycin 500 mg in NS 100 mL rectal enema  500 mg Q6H NR    vancomycin  500 mg Q6H NR     PRN Meds   fentaNYL  25 mcg Q5 Min PRN    fentaNYL  25 mcg Q2H PRN    LORazepam  1 mg Q5 Min PRN    LORazepam  1 mg Q2H PRN    LORazepam  2 mg Q1H PRN    Or    LORazepam  4 mg Q1H PRN    metoprolol  5 mg Q4H PRN    ondansetron  4 mg Q6H PRN    propofol  30 mg Q3 Min PRN    propofol  30 mg Q2H PRN    sodium chloride (PF)  10 mL PRN    sodium chloride (PF)  20 mL PRN    sodium chloride (PF)  50 mL Once PRN     IV Meds   dextrose-sodium chloride 5%-0.45% 75 mL/hr at 07/19/16 0600    fentaNYL Stopped (07/18/16 1900)    heparin (porcine) 1,700 Units/hr (07/19/16 0600)    LORazepam Stopped (07/16/16 1200)    propofol 50.049 mcg/kg/min (07/19/16 0600)    sodium chloride 10 mL/hr at 07/19/16 0600       Allergies:  Allergies   Allergen Reactions    No Known Allergies [Other] Other     Received name: No Known Allergies       ASSESSMENT / PLAN / RECOMMENDATIONS:  34M with a traumatic left lower extremity injury 3 years prior to presents with pain to left lower extremity concerning for claudication  S/p left popliteal to distal PT bypass   S/p Bypass revision, left popliteal to peroneal and peroneal to PT jump graft    07/06/16  LEFT POPLITEAL TO DISTAL POSTERIOR TIBIAL ARTERY BYPASS LEFT SAPHENOUS VEIN HARVEST AORTOGRAM LEFT LEG ANGIOGRAM LEFT POPLITEAL ARTERY ANGIOPLASTY (4X4, 5X4) LEFT DISTAL POSTERIOR TIBIAL ARTERY ANGIOPLASTY       07/07/16  Redo Left transinterosseous below-knee popliteal-peroneal and sequential peroneal-distal posterior tibal-medial tarsal reverse saphenous vein bypass; Left distal segmental fibula resection to expose distal peroneal artery,  Thrombectomy of left below-knee popliteal-posterior tibial reverse saphenous vein bypass    Neuro:   GCS 11T  - off ativan and fentanyl 150 gtt.  propofol 60   - CIWA for h/o etoh Abuse and withdrawal ativan scale increased  -librium 50 ->25 BID,seroquel 170m q8hrs  -aspirin, gabapentin  -tylenol schd, ativan 467mq4->6hrs  Thiamine folate  -Head CT 12/7 Negative    CV:BP  Min: 107/55  Max: 153/71  Pulse  Avg: 110.1  Min: 99  Max: 123  110s-130s/50s-70s  HR 100s  - metop 56m BID    #LLE claudication s/p bypass   - bypass likely thrombosed again  - arterial duplex 07/08/16: There is distal arterial flow noted in the posterior tibial artery. The bypass graft is not visualized.Mild stenosis at the left mid-distal SFA.  - ABI with AT on LLE is 0.63 07/08/16  - q1 neurovscular checks  - on heparin gtt 1700  - pulse ox monitor on LLE (currently at 100%)   -DVT Bilt LE + UE UKoreanegative 07/12/16  -CTA runoff 07/12/16 shows Nonopacified left popliteal to distal posterior tibial artery bypass, suggesting graft failure.    Pulmonary:  Intubated cpap 5/5 40% sating 99%  Arterial Blood Gas result:  pH 7.46;  PCO2 47,  pO2 119, HCO3 32, %O2 Sat 98.  SBT RSBI 40, -23, P/F: 297.5  Extubated this am  CT chest 07/12/16 shows no findings              FEN/GI:  KUB 12/7 shows worsening colonic illeus. CT abdomen 07/12/16 shows pan colitis.  Fluids TKO   protonix, Replete electrolytes mag  NPO  K 3.2 repleted   Swallow study    Diet:Tube Feed Adult Impact Peptide 1.5 Cal; Adult Tube Feed Route: Nasogastric Feeding Tube; Water Flush Volume: 25 mL; Water Flush Frequency: 4 hr; Continuous or Intermittent: Continuous; Initial rate (mL/hour): 20; Rate Increase (mL/hour): 20; Rate ...     Renal:     Intake/Output Summary (Last 24 hours) at 07/19/16 0953  Last data filed at 07/19/16 0800   Gross per 24 hour   Intake          3682.83 ml   Output             2595 ml   Net          1087.83 ml     Stool 200  D/c foley  -f/u Cr 1.3 -> 1.0 -> 1.0 ->0.9 -> 0.7 ->  0.8    Hematology: received 1 u PRBC   hb 10.6 -> 8.9 ->8.0 -> 7.0 -> 9.3 ->8.3 ->7.8 ->7.7 ->7.3->6.7 -> 1u ->8.0 ->8.4 ->8.0 -> 8.4 -> 7.6  Hg 7.0 received 1 uPRBCs 12/5, 12/10  -no DVT ppx on board  D/c'd central, placed PICC  D/c aline    ID: Temp  Avg: 99.6 F (37.6 C)  Min: 98.8 F (37.1 C)  Max: 100.8 F (38.2 C)  afebrile WBC 17.6 -> 13.3 -> 18.8 -> 23.1 -> 26 ->22 ->23 ->21.9 -> 19.2  ; with multiple foul loose stool; c. Diff colitis  Micro: BCx 12/1 NGTD, BC 12/5 NGTD  -UA 12/5 and 12/7 negative   -c.diff positive  -BCx 12/13: NGTD  -sputum Cx 12/13: 1+ gram neg rods. Doubt VAP as pt has unchanged respiratory status thus will not treat  -UA 12/13: neg  -D/C'd vanco enema,   -ON oral vanco, iv flagyl  -f/u ID recs  - continue PO Vancomycin 5033mq6 hours, and IV Flagyl 50057m8hours  - can dc the Vancomycin enema as there is no evidence of ileus  - continue supportive measures (fluids, replacement of electrolytes)  - daily abdominal exam/KUB to monitor   - avoid addition of broad-spectrum antibiotics as this may impede improvement of CDI    Endocrine:  FSBS: < 180    MSK:  PT/OT: yes  Weight Bearing  Status:  weight bear as tolerated. ABI 0.6. Xray shows right knee effusion  -f/u Kub for hip       ICU Care:    Feedings: yes  Analgesia: Yes  Sedation: Yes  Thromboprophylaxis:SCDs, early mobility protocol and subcutaneous heparin  Head-of-bed elevation: Yes   Ulcer prophylaxis: no  Glycemic control: no  GI Bowel care: Yes  Indwelling catheter removal: no  ID: de-escalation of Abx: yes  Spontaneous breathing trial: N/A    Family discussion: yes    DISPO: SICU    C.diff colitis, acute respiratory failure, sepsis, DT,   S/p bypass 12/1  Neuro:Delirium tremens. Previously on precedex gtt. Now intubated on ativan gtt weaned off ativan. onlibrium plan to wean to 25 BID. On fentanyl and propofol. Plan wean to extubation. Ativan 4 q 6.   GCS10T, RASS +2 -> - 2, CAM -   Seroquel . Folate. Thiamine. MVI. Gaba and tylenol.  Cardio: HDS. Tachycardia 99-123. Increase metoprolol 25 BID.on heparin gtt for graft (most recent PTT 67).   Pulm: acute respiratory failure on mechanical ventilation 40%/5 peep. ABG 7.46/47/119/32/98. RSBI 40. NIF -23. Extubation today.   GI: Severe cdiff colitis. On oral vanco (500)and IV flagyl started 7th afternoon. Stop vanco enemas today. Stool output 200 significantly improved.WBC stable.wbc 19and afebrile and no guarding on exam.TKO out plan for Formal swallow eval and diet   Renal: 3.9/2.6 (1.3+). PLan to tko fluids once on diet. previously on rhabdo protocol for rising CK now resolved  ID: afebrile. Blood and urine. Sputum sent but doubt PNA given no cxr findings and no change in pulmonary status   cdiff colitis on oral + rectal vanco and IV flagyl. Oral vanco increased to 500 per ID + monitor abd exam. Plan finish course of PO vanco/IV flagyl x14d   Heme: acute blood loss anemia hgb stable 7.6 < 8.4   Endo: FSBS <180   proph: heparin gtt for graft.  Vasc: Popliteal-posterior tibial artery bypass on 12/1 with resulting graft failure and reoperation on 12/2 and underwent Popliteal to peroneal bypass with jump graft from peroneal artery to posterior tibial artery - this graft has subsequently gone down as well. has collaterals and flow to the foot via AT   Lines/tubes/drains:   PICC line   foley dc this afternoon   aline dc      SICU Attending Attestation:  07/19/16 4:29 PM  Remained in SICU overnight secondary to above diagnoses and due to concern for possible sudden deterioration. Patient met criteria for critical illness and ICU level of care during my evaluation and treatment due to the following diagnoses and/or management issues:    1. Acute hypoxic/hypercarbic respiratory failure - passed SBT. Secretions improved. Extubate today  2. AMS/delirium - improved.  3. C-diff colitis - improving; continue PO vanco/IV flagyl x 14 days total  4. Acute blood loss anemia - continue to closely monitor hemoglobin level. Transfusion is not required at present.    Critical Care Time = 32 minutes today, excluding teaching and procedures.    Charyl Bigger Willy Vorce MD  [facility] Clinical Professor  Department of Surgery

## 2016-07-19 NOTE — Interdisciplinary (Addendum)
07/19/16 1457   Discharge Facilitator   Discharge Facilitator Comments Referral sent to these contracted facilities.        [facility] [phone]       [facility] [phone]       [facility] [phone]       [facility] [phone]       [facility] [phone]

## 2016-07-19 NOTE — Progress Notes (Signed)
Inpatient Heparin Drug Monitoring Note    Jerome Hunt is a 40 year old male on anticoagulation therapy for lower extremity ischemia.    Past Medical History:  Past Medical History:   Diagnosis Date    Alcohol withdrawal delirium, acute, hyperactive (CMS-HCC)     Alcoholism /alcohol abuse (CMS-HCC)     Clostridium difficile colitis        Allergies:  Allergies   Allergen Reactions    No Known Allergies [Other] Other     Received name: No Known Allergies       Medications:    Current Inpatient Medications:   acetaminophen  975 mg Q8H NR    aspirin  81 mg Daily    chlordiazePOXIDE  50 mg BID    chlorhexidine  30 mL BID    folic acid  1 mg Daily    influenza vaccine >=3 yrs  0.5 mL Prior to discharge    LORazepam  4 mg Q4H    metoprolol tartrate  25 mg Q12H    metronidazole (FLAGYL) IVPB  500 mg Q8H NR    pantoprazole  40 mg Daily    QUEtiapine  150 mg Q8H    thiamine  100 mg Daily    vancomycin 500 mg in NS 100 mL rectal enema  500 mg Q6H NR    vancomycin  500 mg Q6H NR      dextrose-sodium chloride 5%-0.45% 75 mL/hr at 07/19/16 0600    fentaNYL Stopped (07/18/16 1900)    heparin (porcine) 1,700 Units/hr (07/19/16 0600)    LORazepam Stopped (07/16/16 1200)    propofol 50.049 mcg/kg/min (07/19/16 0600)    sodium chloride 10 mL/hr at 07/19/16 0600       aPTT Goal:   65 - 90 seconds    Dosing weight = 91 kg     Current Heparin Rate:     Dose (Units/hr) Heparin: 1700 Units/hr    Labs:    Coags:  Recent Labs      07/16/16   1252  07/16/16   2028  07/17/16   0408  07/17/16   1615  07/18/16   0417  07/19/16   0424   PTT  75.6*  69.4*  71.7*  64.9*  67.9*  79.5*       Labs:  (Last 3 days):    Recent Labs      07/17/16   0408  07/18/16   0417  07/19/16   0424   HGB  8.0*  8.4*  7.6*   HCT  24.0*  25.6*  22.9*   CREAT  0.7  0.8  0.8       Drug/Disease Interactions and Assessments:    - Bleeding Assessment:  H/H is stable. Patient does not exhibit any signs or symptoms of bleeding (spoke to RN).      - Current Nutrition Assessment:   Recent changes in nutrition/diet? no  Tube Feed Adult Impact Peptide 1.5 Cal; Adult Tube Feed Route: Nasogastric Feeding Tube; Water Flush Volume: 25 mL; Water Flush Frequency: 4 hr; Continuous or Intermittent: Continuous; Initial rate (mL/hour): 20; Rate Increase (mL/hour): 20; Rate ...    Assessment/Plan:  - aPTT= 79.5seconds is within therapeutic goal range x48 hours  - Will continue heparin maintenance infusion at 1700 units/hour   - Check aPTT every 24hours, to be drawn at 04:00 on 12/15  - Continue to monitor for signs and symptoms of bleeding  - Patient does not have IM medications ordered. If given  use small needle and apply pressure to injection site for 2 minutes.    Pharmacy will continue to monitor closely. For questions please call [phone]    Jerome Hunt, [facility]

## 2016-07-19 NOTE — Progress Notes (Signed)
DAILY PROGRESS NOTE     07/18/16     Current Hospital Stay:   13 days - Admitted on: 07/06/2016    Subjective:  Extubated today  Confused  sedated      Objective:      Current Medications:   acetaminophen  975 mg Q8H NR    aspirin  81 mg Daily    chlordiazePOXIDE  50 mg BID    chlorhexidine  30 mL BID    folic acid  1 mg Daily    influenza vaccine >=3 yrs  0.5 mL Prior to discharge    LORazepam  4 mg Q4H    metoprolol tartrate  25 mg Q12H    metronidazole (FLAGYL) IVPB  500 mg Q8H NR    pantoprazole  40 mg Daily    QUEtiapine  150 mg Q8H    thiamine  100 mg Daily    vancomycin 500 mg in NS 100 mL rectal enema  500 mg Q6H NR    vancomycin  500 mg Q6H NR      dextrose-sodium chloride 5%-0.45% 75 mL/hr at 07/19/16 1045    heparin (porcine) 1,700 Units/hr (07/19/16 1059)    sodium chloride 10 mL/hr at 07/19/16 1046      fentaNYL  25 mcg Q5 Min PRN    fentaNYL  25 mcg Q2H PRN    HYDROmorphone  1 mg Q3H PRN    LORazepam  1 mg Q5 Min PRN    LORazepam  1 mg Q2H PRN    LORazepam  2 mg Q1H PRN    Or    LORazepam  4 mg Q1H PRN    metoprolol  5 mg Q4H PRN    ondansetron  4 mg Q6H PRN    propofol  30 mg Q3 Min PRN    propofol  30 mg Q2H PRN    sodium chloride (PF)  10 mL PRN    sodium chloride (PF)  20 mL PRN    sodium chloride (PF)  50 mL Once PRN       Review of Systems:   Nutrition: Tube Feed Adult Impact Peptide 1.5 Cal; Adult Tube Feed Route: Nasogastric Feeding Tube; Water Flush Volume: 25 mL; Water Flush Frequency: 4 hr; Continuous or Intermittent: Continuous; Initial rate (mL/hour): 20; Rate Increase (mL/hour): 20; Rate ...   Gastrointestinal: Diarrhea  Mobility:  Non  Pain: Data Unavailable    Vital Signs:  Temperature:  [98.8 F (37.1 C)-100.9 F (38.3 C)] 100.9 F (38.3 C) (12/14 1200)  Blood pressure (BP): (107-153)/(55-80) 144/80 (12/14 1200)  Heart Rate:  [102-123] 121 (12/14 1200)  Respirations:  [13-38] 15 (12/14 1200)  Pain Score: FLACC (12/14 1257)   O2 Device: ETT (12/14 0400)  SpO2:  [97 %-100 %] 100 % (12/14 1200)  RASS Score: Restless    Wt Readings from Last 1 Encounters:   07/15/16 101 kg (222 lb 10.6 oz)       Intake/Output (Current Shift):  12/13 0600 - 12/14 0559  In: 3833.2 [I.V.:2273.2]  Out: 2445 [Urine:2245]    Physical Exam:  General Appearance: sedated, extubated  Heart: Sinus tachycardia .  Lungs: bilateral breath sounds present.   Abdomen: soft, distended. Non-peritoneal, Rectal tube in place with brown liquid stool  Extremities: RLE: dopplerable PT and DP signals present  LLE: wound without erythema. Intact. Dressing in place. Dopplerable AT signal.     Diagnostic Data:  Laboratory data:   Lab Results   Component Value Date  K 3.2 (L) 07/19/2016    CL 103 07/19/2016    BICARB 32.6 (H) 07/19/2016    BUN 10 07/19/2016    CREAT 0.8 07/19/2016    GLU 104 07/19/2016    [place] 8.0 (L) 07/19/2016     Lab Results   Component Value Date    HGB 7.6 (L) 07/19/2016    HCT 22.9 (L) 07/19/2016     Lab Results   Component Value Date    HGB 7.6 (L) 07/19/2016    HCT 22.9 (L) 07/19/2016     No results found for: AST, ALT, ALK, TBILI, DBILI, TP, ALB  Lab Results   Component Value Date    PTT 79.5 (H) 07/19/2016     No results found for: ARTPH, ARTPO2, ARTPCO2  No results found for: PH, PO2, PCO2  No results found for: TSH, FREET4, T3  No results found for: CPK, CKMBH, TROPONIN  No results found for: CPK, CKMBH, TROPONIN  No results found for: PHUA, SGUA, GLUCOSEUA, KETONEUA, BLOODUA, [place], LEUKESTUA, NITRITEUA, [place], RBCUA, EPITHCELLSUA, CRYSTALSUA, COMMENTSUA      Assessment and Plan:    ASSESSMENT / PLAN:  18 year oldmale admitted with 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 Patient underwent attempted Popliteal-posterior tibial artery bypass on 12/1 with resulting graft failure and reoperation on 12/2 and underwent Popliteal to  peroneal bypass with jump graft from peroneal artery to posterior tibial artery - this graft has subsequently gone down as well.     Postoperatively he developed acute alcohol withdrawal due to his undisclosed alcohol abuse. Due to his clinical decline and the need for further imaging studies he was intubated and sedated which has been controlling his agitation fairly well.     He currently has a active c diff infectionwithout signs toxic megacolon. WBC downtrending      Neuro: PO and IV pain medications cont. CIWA  CV: Tachycardic due to infection and alcoholic withdrawal, worse now after extubation.  Pulm: Extubated today, evaluate with daily CXR to follow for PNA  GI: Keo-feed non-functional, remove, obtain swallow evaluation, Will need oral vanc restared after can be evaluated for swallow  Renal: Normal creatinine continue Foley catheter for critical illness and close urine monitoring. Will need potassium rteplacmeent K3.2,   Heme:acute blood loss anemia - some element of dilutional component given no active signs of bleeding,transfuse 1 unit pRBC YESTERDAY continue heparin drip  ID: C diff colitis -continue vancomycin and Flagyl, afeb, wbc down-trending. May need to consider colonic lavage if acutely decompensates will need emergent colectomy  Endocrine: ISS  Disposition critical care      Note Author: Gwenevere Ghazi, DO  07/18/16, 7:39 PM    I attest that I had personally interviewed and examined Jerome Hunt during this encounter and have reviewed all of the available diagnostic studies along with the resident physician.  IHilma Favors, MD, agree with the recorded history, physical examination and evaluation above and have supervised formulation of the stated impression, recommendations and care plan.    Extubated. Lower extremity unchanged exam with doppler signals. Treatment for CDiff.    Hilma Favors, MD  07/19/2016 11:19 PM

## 2016-07-20 ENCOUNTER — Inpatient Hospital Stay: Payer: No Typology Code available for payment source

## 2016-07-20 LAB — BASIC METABOLIC PANEL, BLOOD
BUN: 7 mg/dL (ref 7–25)
CO2: 32 mmol/L — ABNORMAL HIGH (ref 21–31)
Calcium: 8.1 mg/dL — ABNORMAL LOW (ref 8.6–10.3)
Chloride: 106 mmol/L (ref 98–107)
Creat: 0.7 mg/dL (ref 0.7–1.3)
Electrolyte Balance: 6 mmol/L (ref 2–12)
Glucose: 124 mg/dL — ABNORMAL HIGH (ref 70–115)
Potassium: 3.2 mmol/L — ABNORMAL LOW (ref 3.5–5.1)
Sodium: 144 mmol/L (ref 136–145)
eGFR - high estimate: >60 (ref >59)
eGFR - low estimate: >60 (ref >59)

## 2016-07-20 LAB — CBC WITH DIFF, BLOOD
Basophils %: 0 %
Basophils Absolute: 0 10*3/uL (ref 0.0–0.2)
Eosinophils %: 0 %
Eosinophils Absolute: 0 10*3/uL (ref 0.0–0.5)
Hematocrit: 22.8 % — ABNORMAL LOW (ref 39.5–50.0)
Hgb: 7.3 G/DL — ABNORMAL LOW (ref 13.5–16.9)
Lymphocytes %.: 8.4 %
Lymphocytes Absolute: 1.9 10*3/uL (ref 0.9–3.3)
MCH: 30.2 PG (ref 27.0–33.5)
MCHC: 32 G/DL (ref 32.0–35.5)
MCV: 94.2 FL (ref 81.5–97.0)
MPV: 9.5 FL (ref 7.2–11.7)
Monocytes %: 10.3 %
Monocytes Absolute: 2.4 10*3/uL — ABNORMAL HIGH (ref 0.0–0.8)
PLT Count: 516 10*3/uL — ABNORMAL HIGH (ref 150–400)
Platelet Morphology: NORMAL
RBC: 2.42 10*6/uL — ABNORMAL LOW (ref 3.70–5.00)
RDW-CV: 15.2 % — ABNORMAL HIGH (ref 11.6–14.4)
Seg Neutro % (M): 81.3 %
Seg Neutro Abs (M): 18.9 10*3/uL — ABNORMAL HIGH (ref 2.0–7.5)
White Bld Cell Count: 23.2 10*3/uL — ABNORMAL HIGH (ref 4.0–10.5)

## 2016-07-20 LAB — PHOSPHORUS, BLOOD: Phosphorus: 1.8 MG/DL — ABNORMAL LOW (ref 2.5–5.0)

## 2016-07-20 LAB — APTT, BLOOD
PTT: 33.6 s (ref 24.1–36.3)
PTT: 39.4 s — ABNORMAL HIGH (ref 24.1–36.3)
PTT: 52.4 s — ABNORMAL HIGH (ref 24.1–36.3)
PTT: 85 s — ABNORMAL HIGH (ref 24.1–36.3)
PTT: 79.9 s — ABNORMAL HIGH (ref 24.1–36.3)

## 2016-07-20 LAB — MAGNESIUM, BLOOD: Magnesium: 1.9 mg/dL (ref 1.9–2.7)

## 2016-07-20 MED ORDER — OXYCODONE HCL 5 MG OR TABS
5.0000 mg | ORAL_TABLET | Freq: Four times a day (QID) | ORAL | Status: DC | PRN
Start: 2016-07-20 — End: 2016-07-27
  Administered 2016-07-20 – 2016-07-24 (×8): 5 mg via ORAL
  Filled 2016-07-20 (×7): qty 1

## 2016-07-20 MED ORDER — QUETIAPINE FUMARATE 50 MG OR TABS
150.0000 mg | ORAL_TABLET | Freq: Two times a day (BID) | ORAL | Status: DC
Start: 2016-07-20 — End: 2016-07-25
  Administered 2016-07-20 – 2016-07-25 (×11): 150 mg via ORAL
  Filled 2016-07-20 (×10): qty 3

## 2016-07-20 MED ORDER — POVIDONE-IODINE 10 % EX SWAB
1.00 | Freq: Two times a day (BID) | CUTANEOUS | Status: AC
Start: 2016-07-20 — End: 2016-07-25
  Administered 2016-07-20 – 2016-07-24 (×9): 1 via TOPICAL

## 2016-07-20 MED ORDER — HYDROMORPHONE HCL 2 MG/ML IJ SOLN
INTRAMUSCULAR | Status: DC
Start: 2016-07-20 — End: 2016-07-24
  Filled 2016-07-20: qty 1

## 2016-07-20 MED ORDER — HYDROMORPHONE HCL 2 MG/ML IJ SOLN
0.3000 mg | INTRAMUSCULAR | Status: DC | PRN
Start: 2016-07-20 — End: 2016-07-24
  Administered 2016-07-20 (×2): 0.3 mg via INTRAVENOUS
  Administered 2016-07-21: 2 mg via INTRAVENOUS
  Administered 2016-07-22 (×2): 0.3 mg via INTRAVENOUS
  Administered 2016-07-22: 2 mg via INTRAVENOUS
  Administered 2016-07-22 – 2016-07-24 (×8): 0.3 mg via INTRAVENOUS
  Filled 2016-07-20 (×12): qty 1

## 2016-07-20 MED ORDER — SODIUM CHLORIDE 0.9 % IV SOLN
40.00 meq | Freq: Once | INTRAVENOUS | Status: AC
Start: 2016-07-20 — End: 2016-07-20
  Administered 2016-07-20 (×2): 40 meq via INTRAVENOUS
  Filled 2016-07-20: qty 9.09
  Filled 2016-07-20: qty 9.1

## 2016-07-20 MED ORDER — ACETAMINOPHEN 325 MG PO TABS
650.0000 mg | ORAL_TABLET | Freq: Four times a day (QID) | ORAL | Status: DC
Start: 2016-07-21 — End: 2016-07-27
  Administered 2016-07-20 – 2016-07-27 (×27): 650 mg via ORAL
  Filled 2016-07-20 (×27): qty 2

## 2016-07-20 MED ORDER — LORAZEPAM 2 MG/ML IJ SOLN
2.0000 mg | Freq: Four times a day (QID) | INTRAMUSCULAR | Status: DC
Start: 2016-07-20 — End: 2016-07-21
  Administered 2016-07-20 – 2016-07-21 (×6): 2 mg via INTRAVENOUS
  Filled 2016-07-20 (×5): qty 1

## 2016-07-20 MED ORDER — HEPARIN SODIUM (PORCINE) 1000 UNIT/ML IJ SOLN (CUSTOM)
7000.0000 [IU] | Freq: Once | INTRAMUSCULAR | Status: DC
Start: 2016-07-20 — End: 2016-07-20

## 2016-07-20 NOTE — Interdisciplinary (Signed)
Speech Therapy Daily Treatment Note    Start of Care: 07/19/16  Onset date : 07/06/16  Reason for referral: Swallow Impairment     Preferred Language:English    Subjective   Contact Time: 1150  Treatment type : Dysphagia  Subjective : Pt seated in bed;Comments (Wife Fleet ContrasRachel present. Family reports confusion)  Pain Rating (0-10): 0    Objective  Pt tolerated 4 oz of dysphagia I trials, 4 oz of dysphagia III trials, and 2 bites of regular/solids (crackers) w/o overt s/s of aspiration.  Noted w/ mild to moderate to delay w/ mastication process on regular solids, which pt reports to be "because I'm a slow eater".  Wife verified that pt is a slow eater at home, but that delay at this time is different than baseline time.  Exhibited overt s/s of aspiration w/ cough and drop in saturation from 96% to 90% following sips of thin liquids by straw; pt demonstrates serial sipping despite verbal cues to drink 1 sip at a time w/ straw. No further s/s of aspiration noted w/ 4 oz of thin liquids by cup sip given 1 sip at a time.  Pt/family education and hands-on demonstration provided; family verbalized understanding and demonstrated follow through of giving liquids 1 sip at a time by cup w/ 0-min verbal cues.      Treatment Today  92526  Tx of swallow dysfunction and/or oral function for feeding: PO trials;Regular solids;Puree;Thin liquid (dysphagia III foods)    Assessment              Assessment : Pt continues to exhibit a risk for aspiration due to intermittently delayed swallow response appreciated w/ clinical follow up; risk for aspiration is increased w/ serial sipping of liquids, especially by straw.  Reduced risk for aspiration was noted w/ feeder follow through of aspiration precautions to give liquids 1 sip at time by cup.  Continues to demonstrate risk for fatigue w/ suboptimal intake on more solid textures.  Continue w/ ST tx for dysphagia w/ plan for diet upgrade as pt  tolerates.  Due to family report of a cognitive decline from baseline leve, pt is recommended for ST cognitive/communication evaluation.     Plan/Recommendations  Plan/Recommendations  Discussion and Agreement: Patient and family  Agreement to Plan of Care: Patient stated understanding and agreement;Family/caregiver stated understanding and agreement  Family Present for Treatment: Spouse  Aspiration Precautions : Sit upright while eating and drinking;Patient must be fed;No straws;Monitor for signs of aspiration;Slow rate of presentation;Small sips/bites;Alternate between foods and liquids;Maintain upright posture after eating for 30 minutes  Additional Recommendations : Dietary consult;Other (Comment) (to determine supplementary nutrition as indicated)  Interdisciplinary Communication : Discussed with RN;Paged to Physician      Plan  Patient to continue therapy for          Recommendations: No straws; continued w/ dysphagia III foods and thin liquids    Goals                                                                                                Progress  Long Term Goal: Pt will tolerate most lenient PO diet of regular foods and thin liquids safely w/ reduced risk for aspiration. Target Date: 08/19/2016   Short Term Goals:  Pt to demonstrate timely mastication and AP transfer of regular solid foods in 5/5x, w/o overt s/s of aspiration.  Target Date: 12/20; 5 visits,   Status: New                              Patient stated goal: "I want some jungle juice."               Total Treatment Time (min): 30

## 2016-07-20 NOTE — Interdisciplinary (Addendum)
0930:  Update on clinical status provided by Maryln GottronNgoc Le, MD (R).  States Isolation precautions in effect for C. Diff: continues to receive post op care/ICU/CIWA protocol.  Extubated yesterday.  GCS 14.    Per progress notes 07-20-2016:  "07/06/16  LEFT POPLITEAL TO DISTAL POSTERIOR TIBIAL ARTERY BYPASS LEFT SAPHENOUS VEIN HARVEST AORTOGRAM LEFT LEG ANGIOGRAM LEFT POPLITEAL ARTERY ANGIOPLASTY (4X4, 5X4) LEFT DISTAL POSTERIOR TIBIAL ARTERY ANGIOPLASTY       07/07/16  Redo Left transinterosseous below-knee popliteal-peroneal and sequential peroneal-distal posterior tibal-medial tarsal reverse saphenous vein bypass; Left distal segmental fibula resection to expose distal peroneal artery,  Thrombectomy of left below-knee popliteal-posterior tibial reverse saphenous vein bypass."      Too soon to anticipate DC planning needs.  Dispo TBD.  CM to continue to monitor clinical status and asst w/ DC planning depending on progress.    1400:  TC rec'd from Pamela/[facility]: brief chart review/provided verbal update as requested.

## 2016-07-20 NOTE — Progress Notes (Signed)
Progress Note  Surgical Intensive Care Unit    Patient Name: Jerome Hunt  MRN: [id number]     Admitting Service: [facility] Attending Provider: Rae Halsted    Mechanism of Injury: <principal problem not specified>    Date: 07/20/16  Hospital Day:   14 days - Admitted on: 07/06/2016    Injuries/Problems: Problem List:  2017-12: Peripheral vascular disease (CMS-HCC)  2017-11: Peripheral artery disease (CMS-HCC)  Clostridium difficile colitis  Alcoholism /alcohol abuse (CMS-HCC)  Alcohol withdrawal delirium, acute, hyperactive (CMS-HCC)    OBJECTIVE:     Vitals signs:     Latest Entry  Range (last 24 hours)    Temperature: 99.1 F (37.3 C)  Temp  Avg: 100.4 F (38 C)  Min: 99.1 F (37.3 C)  Max: 102.2 F (39 C)    Blood pressure (BP): 139/84  BP  Min: 95/66  Max: 149/78    Heart Rate: 113  Pulse  Avg: 121.2  Min: 105  Max: 130    Respirations: 24  Resp  Avg: 26  Min: 13  Max: 38    SpO2: 100 %  SpO2  Avg: 99.8 %  Min: 97 %  Max: 100 %       No Data Recorded       No Data Recorded       No Data Recorded     Weight: 101 kg (222 lb 10.6 oz)  Percentage Weight Change (%): -0.39 %    I&O:  Intake/Output       07/19/16 0600 - 07/20/16 0559 07/20/16 0600 - 07/21/16 0559      0600-1759 2637-8588 Total 0600-1759 5027-7412 Total       Intake    P.O.  --  360 360  --  -- --    I.V.  1817.8  804 2621.8  167  -- 167    Total Intake 1817.8 1164 2981.8 167 -- 167       Output    Urine  1525  3600 5125  --  -- --    Stool  500  400 900  200  -- 200    Total Output 2025 4000 6025 200 -- 200       Net I/O     -207.3 -2836 -3043.3 -33 -- -33            Labs:  CBC  Recent Labs      07/19/16   0424  07/20/16   0339   WBCCOUNT  19.2*  23.2*   HGB  7.6*  7.3*   PLCTEL  447*  516*   BANDP  3.9   --    LYMPP2  6.7  8.4   MONP2  6.7  10.3          Chemistry  Recent Labs      07/19/16   0424  07/20/16   0339   SODIUM  139  144   K  3.2*  3.2*   CL  103  106   CO2  31  32*   BUN  10  7   CREAT  0.8  0.7   GLU  104  124*   [place]  8.0*  8.1*    MG  1.9  1.9   PHOS  2.6  1.8*     No results for input(s): ALK, AST, ALT, TBILI, DBILI, ALB in the last 72 hours.    Coags  Recent Labs      07/20/16   [id number]  07/20/16   0603   PTT  33.6  39.4*       Medications:  Scheduled Meds   acetaminophen  975 mg Q8H NR    aspirin  81 mg Daily    chlordiazePOXIDE  25 mg BID    chlorhexidine  30 mL BID    folic acid  1 mg Daily    influenza vaccine >=3 yrs  0.5 mL Prior to discharge    LORazepam  4 mg Q6H    metoprolol tartrate  25 mg Q12H    metronidazole (FLAGYL) IVPB  500 mg Q8H NR    pantoprazole  40 mg Daily    QUEtiapine  150 mg Q8H    thiamine  100 mg Daily    vancomycin 500 mg in NS 100 mL rectal enema  500 mg Q6H NR    vancomycin  500 mg Q6H NR     PRN Meds   HYDROmorphone  1 mg Q3H PRN    LORazepam  2 mg Q1H PRN    Or    LORazepam  4 mg Q1H PRN    metoprolol  5 mg Q4H PRN    ondansetron  4 mg Q6H PRN    sodium chloride (PF)  10 mL PRN    sodium chloride (PF)  20 mL PRN    sodium chloride (PF)  50 mL Once PRN     IV Meds   dextrose-sodium chloride 5%-0.45% 40 mL/hr at 07/20/16 0633    heparin (porcine) 1,700 Units/hr (07/19/16 2353)    sodium chloride 10 mL/hr at 07/19/16 1046       Allergies:  Allergies   Allergen Reactions    No Known Allergies [Other] Other     Received name: No Known Allergies       ASSESSMENT / PLAN / RECOMMENDATIONS:  38M with a traumatic left lower extremity injury 3 years prior to presents with pain to left lower extremity concerning for claudication  S/p left popliteal to distal PT bypass  S/p Bypass revision, left popliteal to peroneal and peroneal to PT jump graft    07/06/16  LEFT POPLITEAL TO DISTAL POSTERIOR TIBIAL ARTERY BYPASS LEFT SAPHENOUS VEIN HARVEST AORTOGRAM LEFT LEG ANGIOGRAM LEFT POPLITEAL ARTERY ANGIOPLASTY (4X4, 5X4) LEFT DISTAL POSTERIOR TIBIAL ARTERY ANGIOPLASTY       07/07/16  Redo Left transinterosseous below-knee popliteal-peroneal and sequential  peroneal-distal posterior tibal-medial tarsal reverse saphenous vein bypass; Left distal segmental fibula resection to expose distal peroneal artery,  Thrombectomy of left below-knee popliteal-posterior tibial reverse saphenous vein bypass    Neuro:   GCS 14  -librium 50 ->25 BID,seroquel 126m q8hrs  -aspirin, gabapentin  -tylenol schd, ativan 471mq4->6hrs  Thiamine folate  -Head CT 12/7 Negative    CV:BP  Min: 95/66  Max: 149/78  Pulse  Avg: 121.2  Min: 105  Max: 130  110s-140s/50s-70s  HR 100s-120s  - consider  Increasing metop 2519mID    #LLE claudication s/p bypass   - bypass likely thrombosed again  - arterial duplex 07/08/16: There is distal arterial flow noted in the posterior tibial artery. The bypass graft is not visualized.Mild stenosis at the left mid-distal SFA.  - ABI with AT on LLE is 0.63 07/08/16  - q1 neurovscular checks  - on heparin gtt 1700  - pulse ox monitor on LLE (currently at 100%)   -DVT Bilt LE + UE US Koreagative 07/12/16  -CTA runoff 07/12/16 shows Nonopacified left popliteal to distal posterior tibial artery bypass,  suggesting graft failure.    Pulmonary:  RA sating 100%  CT chest 07/12/16 shows no findings              FEN/GI:  KUB 12/7 shows worsening colonic illeus. CT abdomen 07/12/16 shows pan colitis.  Fluids D5 1/2 NS _0   protonix, Replete electrolytes mag  Dysphagia 3  K 3.2 replete, phos 1.8  Swallow study    Diet:Diet Order Therapeutic; Dysphagia; Dysphagia 3 Advanced (chopped); Liquid Consistency: Thin     Renal:     Intake/Output Summary (Last 24 hours) at 07/20/16 8413  Last data filed at 07/20/16 0600   Gross per 24 hour   Intake          2999.15 ml   Output             6100 ml   Net         -3100.85 ml     Stool 1.1L  D/c foley  -f/u Cr 1.3 -> 1.0 -> 1.0 ->0.9 -> 0.7 ->  0.8 ->0.7    Hematology: received 1 u PRBC   hb 10.6 -> 8.9 ->8.0 -> 7.0 -> 9.3 ->8.3 ->7.8 ->7.7 ->7.3->6.7 -> 1u ->8.0 ->8.4 ->8.0 -> 8.4 -> 7.6 ->7.3  Hg 7.0 received 1 uPRBCs 12/5, 12/10   -no DVT ppx on board  D/c'd central, placed PICC  D/c aline    ID: Temp  Avg: 100.4 F (38 C)  Min: 99.1 F (37.3 C)  Max: 102.2 F (39 C)  afebrile WBC 17.6 -> 13.3 -> 18.8 -> 23.1 -> 26 ->22 ->23 ->21.9 -> 19.2 -> 23  ; with multiple foul loose stool; c. Diff colitis  Micro: BCx 12/1 NGTD, BC 12/5 NGTD  -UA 12/5 and 12/7 negative   -c.diff positive  -BCx 12/13: NGTD  -sputum Cx 12/13: 1+ gram neg rods. Now growing 3+ Haemophilus influenza  -UA 12/13: neg  -D/C'd vanco enema,   -ON oral vanco, iv flagyl  -f/u ID recs  - continue PO Vancomycin 529m q6 hours, and IV Flagyl 5037mq8hours  - can dc the Vancomycin enema as there is no evidence of ileus  - continue supportive measures (fluids, replacement of electrolytes)  - daily abdominal exam/KUB to monitor   - avoid addition of broad-spectrum antibiotics as this may impede improvement of CDI    Endocrine:  FSBS: < 180    MSK:  PT/OT: yes  Weight Bearing Status:  weight bear as tolerated. ABI 0.6. Xray shows right knee effusion  -f/u Kub for hip       ICU Care:    Feedings: yes  Analgesia: Yes  Sedation: Yes  Thromboprophylaxis:SCDs, early mobility protocol and subcutaneous heparin  Head-of-bed elevation: Yes  Ulcer prophylaxis: no  Glycemic control: no  GI Bowel care: Yes  Indwelling catheter removal: no  ID: de-escalation of Abx: yes  Spontaneous breathing trial: N/A    Family discussion: yes    DISPO: SICU      C.diff colitis, acute respiratory failure, sepsis, DT,   S/p bypass 12/1  Neuro:Delirium tremens. Previously on precedex gtt. Now intubated on ativan gtt weaned off ativan. On Ativan and librium weaning daily.  onlibrium plan to wean to 25 BID. On . Ativan wean to 2 q 6. Scheduled tylenol, gaba, dilaudid 1 mg Q 3. Discuss with primary regarding oxy  GCS14, RASS 0, CAM  +   Seroquel Decrease to BID . Folate. Thiamine. MVI.   Cardio: HDS. Tachycardia 100-120. Increase metoprolol 25  BID.on heparin gtt for graft (most recent PTT 52).    Pulm: acute respiratory failure-resolved. Extubated 12/14. On RA   GI: Severe cdiff colitis. On oral vanco (500)and IV flagyl started 7th. Stool output increased to 1.1.wbc 123 up from 19. Febrile 39.and no guarding on exam.formal swallow: dysphagia III. TKO once diet improves   Renal: 3.0/6.1(5 L urine). PLan to tko fluids once on diet. previously on rhabdo protocol for rising CK now resolved  ID: febrile. Blood and urine sent. Sputum sent but doubt PNA given no cxr findings and no change in pulmonary status. No plan to treat    cdiff colitis on oral + rectal vanco and IV flagyl. Oral vanco increased to 500 per ID + monitor abd exam. Plan finish course of PO vanco/IV flagyl x14d   Heme: acute blood loss anemia hgb stable 7.3  Endo: FSBS <180   proph: heparin gtt for graft.  Vasc: Popliteal-posterior tibial artery bypass on 12/1 with resulting graft failure and reoperation on 12/2 and underwent Popliteal to peroneal bypass with jump graft from peroneal artery to posterior tibial artery - this graft has subsequently gone down as well. has collaterals and flow to the foot via AT   Lines/tubes/drains:   PICC line   Recommend DC foley   aline dc     SICU Attending Attestation:    Remained in SICU overnight secondary to above diagnoses and due to concern for possible sudden deterioration. Critical care time >75 minutes today, excluding teaching and procedures.  Patient met criteria for critical illness and ICU level of care during my evaluation and treatment due to the following diagnoses and/or management issues:    1. DTs  2. Pulmonary failure  3. Severe C-diff colitis  4. Malnutrition  5. Altered MS  6. \Vent management      Alfonso Patten, MD  [employer] Clinical Professor  Department of Surgery

## 2016-07-20 NOTE — Interdisciplinary (Signed)
Occupational Therapy Evaluation      Current Functional StatusBoston AM-PAC: Daily Activity  Assistance Needed to Put on and Take off Regular Lower Body Clothing: Total (Dependent)  Assistance Needed to Bathe, Including Washing, Rinsing, and Drying: A lot (Mod/Max Assist)  Assistance Needed to Toilet [employer], Bedpan, or Urinal): Total (Dependent)  Assistance Needed to Put on and Take off Regular Upper Body Clothing: A lot (Mod/Max Assist)  Assistance Needed to Take Care of Personal Grooming Such as Brushing Teeth: A lot (Mod/Max Assist)  Assistance Needed to Eat Meals: A lot (Mod/Max Assist)  AM-PAC Daily Activity Total Score: 10  Assessment: AM-PAC Daily Activity - Current Functional Impairment Level:  CL: 60-79% impaired    Post Acute Discharge Considerations  Anticipated Required Level of Cognitive Assistance: All of the time  Anticipated Required Level of Safety Assistance: Constant supervision  Anticipated Required Level of Self Care Assistance: All of the time;One person assist  Anticipated Physical Barriers Impacting Discharge: Stairs/steps  Anticipated Level of Therapy Tolerance and Ongoing Therapy Needs: Would benefit from intermittent Occupational Therapy intervention post acute discharge to maximize functional independence;Would benefit from intensive Occupational Therapy intervention post acute discharge to maximize functional independence (currently requires 24 hour assist)    Referring Physician: Self, Referred         Inpatient    Visit Diagnoses       Codes    Dysphagia, unspecified type     ICD-10-CM: R13.10  ICD-9-CM: 787.20    Impaired mobility and ADLs     ICD-10-CM: Z74.09  ICD-9-CM: 799.89        Start of Care: 07/20/16  Reason for Referral: Decline in functional mobility;Decline in functional ability;Difficulty with activities of daily living  Preferred Language:English       Patient History   Medical History  Dominant side: Right   History of presenting condition: 40 y/o male who presented to [facility] to follow up for c/o pain in left lower extremity that was injured 3 years ago; concern for claudication. 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    Past Medical History:   Diagnosis Date    Alcohol withdrawal delirium, acute, hyperactive (CMS-HCC)     Alcoholism /alcohol abuse (CMS-HCC)     Clostridium difficile colitis      No past surgical history on file.     Functional History  Prior Level of Function: No deficits  Instrumental Activities of Daily Living : Independent with IADLs  Functional Mobility Assistance Needs: None-Independent with mobility  General ADL/Self-Care Assistance Needs: None- Independent with ADLs and self care  Other Information: pt poor historian and labile when attempting to recall home information. Wife at bedside at end of session, able to clarify pt was independent and active, but unemployed and filling out paperwork for disability.     Social History  Living Situation: Lives with spouse/partner  Home Environment: Apartment (2nd floor, no elevator)  Other Information: wife states may ask her parents to allow pt and her to stay with if pt needs a single story home        Subjective  Subjective Information: pt initially lethargic, increased time and environmental stim for pt to maintain arousal for participation in OT     Pain Assessment       Most Recent Value    Pain Rating    Patient status Patient agreeable to treatment  Patient premedicated prior to treatment with Intravenous pain medication    IP Pain  Numeric Pain Rating Scale    Numeric Pain Rating Scale     Pain Description unable to rate 2/2 decreased cog. states "I hurts a lot"        Inpatient Patient Safety Considerations  Patient Call Light Within Reach: Yes   Patient in Bed at the End of Treatment In: Left sidelying position  Feet Positioned in Neutral and Heels Floating Using Appropriate Device: Yes, nursing notified of proper position  The Patient May Be at Risk for Falls: Yes, notified nursing  Activity Restrictions   Activity Restrictions: None  Inpatient Precautions/Contraindications  Precautions: Behavioral  Behavioral: Restraints (B wrist )  Patient premedicated prior to treatment with: Intravenous pain medication    Objective  pt decreased cognition, activity tolerance, ADL skills, pain LLE, decreased balance and functional mobility impacting ability to complete ADL skills        OT Functional       07/20/16 1400    Inpatient Precautions/Contraindications    Precautions Behavioral    Behavioral Restraints   B wrist       07/20/16 1400    Activities of Daily Living (ADLs)    Patient Participation in ADL Tasks Patient agreeable to participate in ADL tasks    Self Feeding --   hand to mouth I'ly. decreased safety and cog    Self Grooming Maximum assistance (50-75% assistance)   decreased attention/cog    Upper Extremity Dressing Maximum assistance (50-75% assistance)    Lower Extremity Dressing Dependent/total assistance ( >75% assistance)    Bathing Maximum assistance (50-75% assistance)    Toileting Unable   foley and flexi seal    Toilet Transfer Unable   [facility]      07/20/16 1400    [place] AM-PAC: Daily Activity    Assistance Needed to Put on and Take off Regular Lower Body Clothing 1    Assistance Needed to Bathe, Including Washing, Rinsing, and Drying 2    Assistance Needed to Toilet [employer](Toilet, Bedpan, or Urinal) 1    Assistance Needed to Put on and Take off Regular Upper Body Clothing 2    Assistance Needed to Take Care of Personal Grooming Such as Brushing Teeth 2    Assistance Needed to Eat Meals 2    AM-PAC Daily Activity Total Score 10    Assessment: AM-PAC Daily Activity - Current Functional Impairment Level Score 9-13       07/20/16 1400    Instrumental Activities of Daily Living (IADLS) (Patient Reported Baseline Level)    Instrumental Activities of Daily Living (IADLs) Comments unable to comlpete IADL tasks at this time      07/20/16 1400    Activity Tolerance    General Activity Tolerance Poor - tolerates 25-49% of treatment session      07/20/16 1400    Cognition Assessment    Cognition Restrictions Cognitive deficits present    Cognition Comments A+O to self only. able to state "in hopsital" with minVC. tangential, decreased safety, distractible      07/20/16 1400    Communication Assessment    Communication Restrictions No communication limitations or impairments noted. Current status of hearing, speech and vision allow functional communication.      07/20/16 1400    Coordination/Motor Control Assessment    Coordination/Motor Control Restrictions No limitations or impairments noted. Movement patterns are fluid and coordinated throughout  07/20/16 1400    Extremity Assessment - General    Extremity Restrictions Flexibility, strength and tone grossly within functional limits throughout      07/20/16 1400    Mobility for Daily Activities    Patient Participation in Mobilization Activities Patient agreeable to participate in mobility/or out of bed activities    Functional Mobility Detailed Comments 2 person assist for all functional mobility for safety 2/2 decreased cog, also limited 2/2 pain LLE, and decreased balance      07/20/16 1400    Balance for Daily Activities    Balance Restrictions Balance limitations present      07/20/16 1400    Static Sitting Balance    Static Sitting Balance - Control Fair - able to maintain balance with handhold support, may require occasional minimal assistance      07/20/16 1400    Static Standing Balance    Static Standing Balance - Control Poor - requires handhold support and moderate to maximal assistance to maintain position      07/20/16 1400    Sensation - General     Sensation - General Intact - no limitations or impairments noted throughout                            Assessment: pt with potential to benefit from skilled OT services   Potential to improve with therapy: Good    Plan  The plan of care was developed in conjunction with the patients goals. It was reviewed with the patient, including a review of the physical findings, proposed treatment, frequency and duration of treatment sessions, precautions, limitations and expected outcomes. The patient acknowledged understanding of all of the above and agreed to the treatment plan as stated.    Patient Goals :   Therapy Goals                                                                                        Current Level Goals for Episode of Care    Functional Deficit     Long term Functional Goal      Impairment #1 ADL function Short Term Goal #1 Impairment: ADL function  ADL Function Goal 1: General  General: Patient demonstrates the ability to perform and complete lower body dressing in seated position with  Level of Assistance: moderate assistance  Number of Visits: 3-5  Goal Status: New   Impairment #2 ADL function Short Term Goal #2 Impairment: ADL function  ADL Function Goal 2: General  General: Patient demonstrates the ability to perform and complete a toilet transfer in the bathroom with  Level of Assistance: moderate assistance  Number of Visits: 3-5  Goal Status: New   Impairment #3   Short Term Goal #3     Impairment #4   Short Term Goal #4     Functional Limitation Reporting    Rationale: Acceptable functional assessment;Clinical judgement  Functional Assessment Tool: AM-PAC- 6 Clicks Daily Activity Inpatient  Visit Type: Initial ongoing  Functional Assessment Tool  Rationale: Acceptable functional assessment;Clinical judgement  Functional Assessment Tool: AM-PAC- 6 Clicks Daily Activity Inpatient  Assessment: AM-PAC Daily Activity - Current Functional Impairment Level:  CL: 60-79% impaired      Planned Therapy Interventions and Rationale  Patient Education: to increase safety during dynamic activities;to increase knowledge of purpose of home exercise program;to increase independence in functional activities  Self-Care/ADL Training: to improve safety when completing daily activities and self care;to improve safety while using an assistive device;to improve home safety;to improve independence with compensatory strategies  Therapeutic Activities: to improve transfers between surfaces;to restore functional performance using graded activities;to improve navigating home and/or community;to improve independence with self care and daily activities;to improve functional mobility;to improve ability to change body position  Therapeutic Exercise: to increase strength;to increase range of motion;to improve posture;to improve independence with self care and daily activities;to improve independence with functional mobility skills;to increase flexibility;to improve activity tolerance       Inpatient Treatment Plan  Frequency of treatment : 5 times per week  Duration of Treatment: One time only, further treatment not indicated  Status of Treatment: Patient evaluated and will benefit from ongoing skilled therapy  Treatment Plan Discussion  Treatment Plan Discussion & Agreement: Patient;Spouse/partner  Patient/Family Questions: Yes - All questions asked & answered  Patient/Family Education  Learner(s): Patient;Family  Patient/Family Training in Appropriate Therapeutic Interventions: Initiated;Ongoing  Education Topic(s): ADL/Self Care;Benefits of out of bed to chair;Engagement in Occupations;Home/Community Safety     Therapy Plan Communication  Therapy Plan Communication: Discussed therapy plan with Nursing    Treatment Today   Type of Eval  High Complexity [phone]): Completed                   Focus for Next Treatment: ADL retraining;Balance activities;Cognitive  activities;Functional mobility training for ADLs;Patient/Family/Caregiver training;Patient exercise program instruction;Therapeutic exercise;Therapeutic activities  Treatment Time  Treatment Start Time: 0930  Total TIMED Treatment (min): 60  Total Treatment Time (min): 60

## 2016-07-20 NOTE — Progress Notes (Signed)
Heparin per Pharmacy Protocol - Progress Note    40 year old year old male continues on heparin anticoagulation for lower extremity ischemia   --heparin since 12/1; today is day 15; aPTT Goal:   65-90 seconds   --admitted 07/06/2016  --190 cm  --101 kg      Heparin Rates and aPTT Results  1700 units/h  --aPTT 33.6 12/15 at 0339; drawn from PICC  --rechecked aPTT = 39.4 at 06:03; drawn from PICC  --rechecked aPTT = 52.4 at 07:54; drawn from peripheral stick    H/H/plts: 73./22.8/516K      Assessment/Plan  aPTT had previously been stable within goal 65-90 on heparin infusion rates approximately 19 units/kg/hr; aPTT levels this morning markedly lower; bag concentration and rate were verified and no interruptions in heparin infusion noted by nurse.  Likely due to dilution of sample by IVF infusion in PICC line.  Peripheral draw on opposite arm appears to confirm this.  No evidence of bleeding.  MD aware of low H/H and agrees to proceed with infusion    Continue protocol to achieve goal aPTT 65-90; aPTT every 6 hours for now.  aPTT blood draws should be done away from IV fluid site.

## 2016-07-20 NOTE — Interdisciplinary (Signed)
Physical Therapy Evaluation      Current Functional StatusBoston AM-PAC: Basic Mobility  Assistance Needed to Turn from Back to Side While in a Flat Bed Without Using Bedrails: A lot (mod/max assist)  Difficulty with Supine to Sit Transfer: A lot (mod/max assist)  How Much Help Needed to Move to/from Bed to Chair: Total (dependent)  Difficulty with Sit to Stand Transfer from Chair with Arms: A lot (mod/max assist)  How Much Help Needed to Walk in Room: Total (dependent)  How Much Help Needed to Climb 3-5 Steps with a Rail: Total (dependent)  AMPAC Total Score: 9  Assessment: AM-PAC Basic Mobility - Current Functional Impairment Level: CL: 60-79% impaired    Post Acute Discharge Considerations  Anticipated required level of cognitive assistance: All of the time  Anticipated required level of safety assistance: Constant supervision  Anticipated required level of physical assistance: All of the time  Anticipated physical barriers impacting discharge: Stairs/steps  Anticipated  level of therapy tolerance and ongoing therapy needs: Would benefit from intensive Physical Therapy intervention post acute discharge to maximize functional independence    Referring Physician: Self, Referred         Inpatient    Visit Diagnoses       Codes    Dysphagia, unspecified type     ICD-10-CM: R13.10  ICD-9-CM: 787.20    Impaired mobility and ADLs     ICD-10-CM: Z74.09  ICD-9-CM: 799.89        Start of Care: 07/20/16  Onset/Admission date : 07/06/16  Reason for referral: Decline in functional ability;Decline in functional mobility;Fall risk management  Preferred Language:English       Patient History   Medical History  Dominant side: Right  History of presenting condition: Pt is a 40 y.o. male w/ history of traumatic LLE injury from 3 years ago who presented w/ LLE pain concerning for claudication. Hospital stay significant for alcohol withdrawal and intubation.    Surgery/Procedure affecting therapy: s/p (L) popliteal to distal posterior tibial  artery bypass on 07/06/16, redo of below knee bypass and thrombectomy on 07/07/16  Past Medical History:   Diagnosis Date    Alcohol withdrawal delirium, acute, hyperactive (CMS-HCC)     Alcoholism /alcohol abuse (CMS-HCC)     Clostridium difficile colitis      No past surgical history on file.     Functional History  Prior Level of Function: No deficits  Communication : No communication impairment  Employment Status: Unemployed  Instrumental Activities of Daily Living : Independent with IADLs  Functional Mobility Assistance Needs: None-Independent with mobility  General ADL/Self-Care Assistance Needs: None- Independent with ADLs and self care  Other Information: pt poor historian and labile when attempting to recall home information. Wife at bedside at end of session, able to clarify pt was independent and active, but unemployed and filling out paperwork for disability.   Social History  Living Situation: Lives with spouse/partner (Wife to assist 24/7 upon D/C.)  Home Environment: Apartment;Multi-story home  Home accessibility : Resides on second floor;Stairs present (2 flights of stairs w/ rails on both sides. Per wife, will possibly stay at her parents' home which is 1-story.)  Handrails available to enter home: On both sides when ascending steps/stairs  Other Information: wife states may ask her parents to allow pt and her to stay with if pt needs a single story home    Subjective   Subjective  Subjective Information:  (Per RN, pt has been confused, needing wrist restraints, 1:1 sitter.  LLE tender to touch.)   Pain Assessment       Most Recent Value    Patient status Patient agreeable to treatment, Nursing in agreement for treatment    Patient premedicated prior to treatment with None    Other Pain Tools Numeric Pain Rating Scale    Pain Intensity - rating at present 0     Pain Intensity - rating at worst  8 [moving and touching left lower leg and foot]        Inpatient Precautions/Contraindications  Precautions: Fall;Behavioral;Isolation  Behavioral: Restraints;Sitter required  Fall: Socks/charm  Isolation: Contact (Spore)  Equipment/Lines Currently Present at Bedside: Lines/drains/airways  Lines/Drains/Airways: Telemetry;IV;Catheter  Safety Measures  Functional Mobility Assistance Needs: None-Independent with mobility  General ADL/Self-Care Assistance Needs: None- Independent with ADLs and self care  Other Information: pt poor historian and labile when attempting to recall home information. Wife at bedside at end of session, able to clarify pt was independent and active, but unemployed and filling out paperwork for disability.   Inpatient Patient Safety Considerations  Patient call light within reach: Yes  Patient in bed at the end of treatment in: Left sidelying position  The patient may be at risk for falls: Yes- Notified Nursing Mardella Layman)  Family/caregiver present for treatment:  (Wife arrived at end of session.)    Objective  Objective Findings  Objective Findings: Standing activity limited by LLE pain w/ weightbearing and movement.   Assistive Device Used: Acupuncturist Considerations While Mobilizing: Decreased awareness of need for assistance (initially lethargic, confused)  Physiologic Response to Mobilization: Asymptomatic with mobilization - vitals respond appropriately to activity, no subjective complaints         PT Functional       07/20/16 1600    Inpatient Precautions/Contraindications    Precautions Fall;Behavioral;Isolation    Behavioral Restraints;Sitter required    Fall Socks/charm    Isolation Contact   Spore    Equipment/Lines Currently Present at Bedside Lines/drains/airways    Lines/Drains/Airways Telemetry;IV;Catheter      07/20/16 1600    [place] AM-PAC: Basic Mobility    Assistance Needed to Turn from Back to Side While in a Flat Bed Without  Using Bedrails 2    Difficulty with Supine to Sit Transfer 2    How Much Help Needed to Move to/from Bed to Chair 1    Difficulty with Sit to Stand Transfer from Chair with Arms 2    How Much Help Needed to Walk in Room 1    How Much Help Needed to Climb 3-5 Steps with a Rail 1    AMPAC Total Score 9    Assessment: AM-PAC Basic Mobility - Current Functional Impairment Level Score 9-12      07/20/16 1600    Functional Mobility - General    Patient Participation in Mobilization Activities Patient agreeable to participate in mobility/or out of bed activities    Bed Mobility Maximum assistance (50-75% assistance)    Transfers to/from Stand Maximum assistance (50-75% assistance)    Gait Tolerance    Tolerance --   Pt unable to tolerate gait training today due to pain.    General Functional Mobility Comments Max A for rolling and supine to sit. Max A x 2 to stand w/ FWW. Pt has limited weightbearing tolerance on LLE due to pain. Total A x 2 to return to supine.       07/20/16 1600    Activity Tolerance    General Activity Tolerance Fair -  tolerates 50%-74% of treatment session      07/20/16 1600    Cardiopulmonary    Cardiopulmonary --   HR 105 to 115 during session.      07/20/16 1600    Cognition Assessment    Cognition Restrictions Cognitive deficits present    Overall Cognitive Status --   Pt is confused.     Arousal/Alertness --   Initially lethargic      07/20/16 1600    Extremity Assessment - General    Extremity Restrictions Flexibility, strength and tone grossly within functional limits throughout   except LLE limited by pain, edema (surgery site)      07/20/16 1600    Balance Assessment    Balance Restrictions Balance limitations present      07/20/16 1600    Static Sitting Balance    Static Sitting Balance - Control Fair - able to maintain balance with handhold support, may require occasional minimal assistance      07/20/16 1600    Dynamic Sitting Balance     Dynamic Sitting Balance - Control Fair - accepts minimal challenge, able to maintain balance while turning head/trunk      07/20/16 1600    Static Standing Balance    Static Standing Balance - Control Poor - requires handhold support and moderate to maximal assistance to maintain position      07/20/16 1600    Dynamic Standing Balance    Dynamic Standing Balance - Control Poor - unable to accept challenge or move without loss of balance      07/20/16 1600    Sensation - General    Sensation - General --   Unable to accurately assess due to cognitive status.      Assessment : Pt tolerated treatment well. Bed mobility, transfers, and standing tolerance limited by 8/10 LLE pain. Pt presents w/ impaired bed mobility, transfers, and gait. Pt would benefit from PT to improve overall functional mobility and independence.  Rehab Potential: Good    Plan  The plan of care was developed in conjunction with the patients goals. It was reviewed with the patient, including a review of the physical findings, proposed treatment, frequency and duration of treatment sessions, precautions, limitations and expected outcomes. The patient acknowledged understanding of all of the above and agreed to the treatment plan as stated.    Patient Goals :to walk  Therapy Goals                                                                                        Current Level Goals for Episode of Care    Functional Deficit Impaired bed mobility, transfers, gait, and activity tolerance.   Long term Functional Goal  Functional deficit summary: Impaired bed mobility, transfers, gait, and activity tolerance.  Goal (Long Term) : Pt will be supervised w/ bed mobility, transfers, and household ambulation using FWW.  Goal status: New   Impairment #1 Functional Mobility Short Term Goal #1 Impairment: Functional Mobility  Custom goal: Pt will be able to perform rolling and supine to/from sit with min A using bedrails.   Goal status: New    Impairment #  2 Functional Mobility Short Term Goal #2 Impairment : Functional Mobility  Custom goal: Pt will be able to perform stand pivot transfers using FWW w/ mod A.   Goal status: New   Impairment #3 Gait Short Term Goal #3 Impairment : Gait  Custom goal: Pt will be able to ambulate 30 ft using FWW w/ mod A.   Goal status: New   Impairment #4   Short Term Goal #4     Functional Limitation Reporting    Rationale: Acceptable functional assessment;Clinical judgement;Patient history and medical status  Functional Assessment Tool: AM-PAC- 6 Clicks Basic Mobility Inpatient  Visit type: Initial completed  Impairment Category - One time only: Mobility  Mobility: Walking and Moving Around Current Status (Z6109(G8978): CL 60%-79% impaired, limited or restricted  Mobility: Walking and Moving Around Goal Status (U0454(G8979): CI 1%-19% impaired, limited or restricted  Functional Assessment Tool  Rationale: Acceptable functional assessment;Clinical judgement;Patient history and medical status  Functional Assessment Tool: AM-PAC- 6 Clicks Basic Mobility Inpatient  Assessment: AM-PAC Basic Mobility - Current Functional Impairment Level: CL: 60-79% impaired     Planned Therapy Interventions and Rationale  Gait Training: to improve safety while ambulating with assistive device;to improve safety with household ambulation;to improve safety with stair navigation;to improve safety with community ambulation  Therapeutic Activities: to improve transfers between surfaces;to improve ability to change body position;to improve functional mobility  Theraputic Exercise: to improve activity tolerance;to improve independence with functional mobility skills       Inpatient Treatment Plan  Frequency of treatment: 5 times per week  Status of treatment: Patient evaluated and will benefit from ongoing skilled therapy  Treatment Plan Discussion  Treatment Plan Discussion & Agreement: Patient;Spouse/partner  Patient/Family Questions: No questions       Treatment Today  Type of Eval  High Complexity [phone]): Completed   Focus for Next Treatment: Gait training;Functional mobility training for ADLs;Therapeutic activities;Therapeutic exercise  Treatment Time   Treatment start time: 1015  Total TIMED Treatment  (min): 60  Total Treatment Time (min): 60

## 2016-07-21 DIAGNOSIS — R1310 Dysphagia, unspecified: Secondary | ICD-10-CM

## 2016-07-21 DIAGNOSIS — R41841 Cognitive communication deficit: Secondary | ICD-10-CM

## 2016-07-21 LAB — CBC WITH DIFF, BLOOD
Basophils %: 0 %
Basophils Absolute: 0 10*3/uL (ref 0.0–0.2)
Eosinophils %: 0 %
Eosinophils Absolute: 0 10*3/uL (ref 0.0–0.5)
Hematocrit: 22.3 % — ABNORMAL LOW (ref 39.5–50.0)
Hgb: 7.4 G/DL — ABNORMAL LOW (ref 13.5–16.9)
Lymphocytes %.: 18.3 %
Lymphocytes Absolute: 3.6 10*3/uL — ABNORMAL HIGH (ref 0.9–3.3)
MCH: 31 PG (ref 27.0–33.5)
MCHC: 33.1 G/DL (ref 32.0–35.5)
MCV: 93.4 FL (ref 81.5–97.0)
MPV: 9.4 FL (ref 7.2–11.7)
Metamyelocytes %: 1 %
Metamyelocytes Absolute: 0.2 10*3/uL — ABNORMAL HIGH
Monocytes %: 2.9 %
Monocytes Absolute: 0.6 10*3/uL (ref 0.0–0.8)
Myelocytes %: 0.9 %
Myelocytes Absolute: 0.2 10*3/uL — ABNORMAL HIGH
Nucleated RBCs: 1 /100 WBC
PLT Count: 574 10*3/uL — ABNORMAL HIGH (ref 150–400)
Platelet Morphology: NORMAL
RBC: 2.39 10*6/uL — ABNORMAL LOW (ref 3.70–5.00)
RDW-CV: 15.2 % — ABNORMAL HIGH (ref 11.6–14.4)
Seg Neutro % (M): 76.9 %
Seg Neutro Abs (M): 15.3 10*3/uL — ABNORMAL HIGH (ref 2.0–7.5)
White Bld Cell Count: 19.9 10*3/uL — ABNORMAL HIGH (ref 4.0–10.5)

## 2016-07-21 LAB — MAGNESIUM, BLOOD: Magnesium: 1.9 mg/dL (ref 1.9–2.7)

## 2016-07-21 LAB — BASIC METABOLIC PANEL, BLOOD
BUN: 6 mg/dL — ABNORMAL LOW (ref 7–25)
CO2: 30 mmol/L (ref 21–31)
Calcium: 8.3 mg/dL — ABNORMAL LOW (ref 8.6–10.3)
Chloride: 107 mmol/L (ref 98–107)
Creat: 0.7 mg/dL (ref 0.7–1.3)
Electrolyte Balance: 6 mmol/L (ref 2–12)
Glucose: 115 mg/dL (ref 70–115)
Potassium: 3.2 mmol/L — ABNORMAL LOW (ref 3.5–5.1)
Sodium: 143 mmol/L (ref 136–145)
eGFR - high estimate: >60 (ref >59)
eGFR - low estimate: >60 (ref >59)

## 2016-07-21 LAB — PTT CRT VAL CALL

## 2016-07-21 LAB — APTT, BLOOD
PTT: 53.7 s — ABNORMAL HIGH (ref 24.1–36.3)
PTT: 116.5 s (ref 24.1–36.3)

## 2016-07-21 LAB — PHOSPHORUS, BLOOD: Phosphorus: 3.1 MG/DL (ref 2.5–5.0)

## 2016-07-21 MED ORDER — POTASSIUM CHLORIDE 20 MEQ/100ML IV SOLN
20.00 meq | INTRAVENOUS | Status: AC
Start: 2016-07-21 — End: 2016-07-21
  Administered 2016-07-21 (×3): 20 meq via INTRAVENOUS
  Filled 2016-07-21 (×3): qty 100

## 2016-07-21 MED ORDER — HEPARIN 25,000 UNITS/250 ML 1/2 NS PREMIX INFUSION (~~LOC~~)
2350.0000 [IU]/h | INTRAMUSCULAR | Status: DC
Start: 2016-07-21 — End: 2016-07-22
  Administered 2016-07-22: 2350 [IU]/h via INTRAVENOUS
  Filled 2016-07-21: qty 250

## 2016-07-21 NOTE — Progress Notes (Signed)
Heparin per Pharmacy Protocol - Progress Note    40 year old year old male continues on heparin anticoagulation for lower extremity ischemia   --heparin since 12/1; today is day 16; aPTT Goal:   65-90 seconds   --admitted 07/06/2016  --190 cm  --101 kg      Heparin Rates and aPTT Results  2100 units/h  --aPTT 53.7 on 12/16 at 1125; drawn from PICC    H/H/plts: 7.4/22.8/574K      Assessment/Plan  -aPTT is subtherapeutic.   -No sis/x of bleeding per RN  -Increase heparin to 2100 units/hour per protocol  -check aPTT q6h, next draw at 18:30    Continue protocol to achieve goal aPTT 65-90.  aPTT blood draws should be done away from IV fluid site.

## 2016-07-21 NOTE — Interdisciplinary (Signed)
Speech Language Pathology Evaluation    Start of Care: 07/21/16  Onset date : 07/20/16  Reason for referral: Cognitive Impairment     Preferred Language:English    Evaluation Type: Language/Cognition    Patient History   Contact Time: 0905  Onset date (this episode): 07/06/16  Patient history: Pt is a 40 y/o male [facility] for c/o pain in left lower extremity that was injured 3 years ago; concern for claudication. 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  Pt referred for speech/language evaluation due to reported decline in cognitive function skills   Has assistance: Yes  Living situation: Lives with family  Employment Status: Unemployed  Type of evaluation: [phone] - Evaluation of speech sound production (eg, articulation, phonological process, apraxia, dysarthria) with evaluation of language comprehension and expression (eg, receptive and expressive language)  Equipment Present: Restraints  Pain Rating (0-10): 0  [facility]  Respiratory Status : Room air  Level of Alertness: Confused;Distractible;Impulsive;Confabulatory;Delayed response to stimuli  Vocal Quality : Clear  Diagnosis : Cognitive Communication Deficit  Temperature: 98.6 F (37 C) (07/21/16 0600)  No results found for: WBC      Oral Motor Evaluation    Oral Motor Evaluation  Facial Muscles: Intact bilaterally  Dentition: Within normal limits  Buccal cavity: Normal  Mucosal Quality : Adequate  Lips: Normal  Tongue movement: Normal protrusion;Normal lingual sensation  Jaw movement: Normal  Soft palate: Elevates symmetrically  Vocal Quality : Clear        Language/Cognition Evaluation     Evaluation - Patient received in bed with soft restraints and 1:1 sitter present for safety.  Pt's wife arrived during session.   Arousal/alertness: Delayed Responses to stimuli   Attention span: Difficulty attending to auditory directions  Memory: Decreased recall of recent events; Decreased short term memory  Orientation: Oriented to self and month only - unaware today is his birthday despite cues  Safety judgement: Decreased safety awareness  Problem solving: Impaired  Mental calculations: Pt did not able to participate/not tested  Hearing acuity: Within functional limits for evaluation  Auditory comprehension: Patient inconsistently followed one step commands.  Frequent off topic or confabulatory remarks noted during auditory comprehension tasks (simple WH or yes no questions).  Decreased attention to task also impacting comprehension  Auditory Compensatory Strategies: Elimination of environmental distractions  Paragraph comprehension: Pt not able to participate/not tested  Reading comprehension: Requires assessment  Graphomotor skills: Pt not able to participate/not tested  Motor speech function: Within normal limits  Speech intelligibility rating: Within normal limits  Verbal repetition: Other (comments)  Object naming: Patient 20% accurate for object naming task impacted by decreased attention, tangential responses  Language output: Tangential; intermittent off topic and confabulatory statements observed.  Able to verbalized basic wants and needs  Thought Organization: Moderate impairment  Reasoning: Pt not able to participate/not tested  Pragmatics: Poor eye contact;Topic maintenance problems  Sequencing : Impaired  Conversational Characteristics: Irrelevant;Tangential;Delayed;Confabulation    Assessment:  Patient presents iwth moderate cognitive communication deficits involving attention, orientation, memory, problem solving, processing speed and response time with impaired safety judgement/awarness.  Pt exhibited tangential and confabulatory remarks a times.  Cognitive deficits impacting receptive and expressive language  skills.  Patient would benefit from ongoing skilled ST intervention to improve cognitive communication skills to baseline level of function  Functional Limitation Reporting G-Codes  Functional Assessment Tool Used: Clinical judgement  Functional Limitations: Attention  Attention Current Status (U9811(G9165): CL 60%-79% impaired, limited or restricted  Attention Goal Status (B1478(G9166): CL 60%-79% impaired, limited or restricted  Attention Discharge Status (G9562(G9167): CL 60%-79% impaired, limited or restricted    Plan/Recommendations    Discussion and Agreement: Family  Agreement to Plan of Care: Patient unable to demonstrate understanding of treatment plan;Family/caregiver stated understanding and agreement  Family Present for Treatment: Spouse  Interdisciplinary Communication : Discussed with RN  At end of session, patient remained in bed with restraints in place and 1:1 sitter present   Individualized Plan For Next Session: orientation, attention, simple problem solving    Plan  The goals listed below are achievable and realistic within the designated time frame and the treatments listed below, and referred to in the treatment plan, are medically necessary to achieve these goals. The functional goals were created based on the patient's prior level of function.    Goals                                                                                        Long term goals Target Goal:  08/21/16  Language/cognition : Improve cognitive communication skills to suport ADLs in home and community settings  Status:  New                     Short term goals (Language/Cognition)  Target Date 07/27/16  Goal: Improve sustained attention to 15 minutes during various cognitive linguistic tasks  Visits : 5  Status : New  Goal: Imrpove orientation to x 4 with mild cues  Visits : 5  Status : New  Goal: Pt to complete simple problem sovling tasks with 80% accuracy  Visits : 5  Status : New      Patient stated goal: "It's there and there for goals"       Treatment Plan  Frequency: 5 times per week   Duration:  Until discharged from SLP service   Rehab Potential: Good  Discussion and Agreement: Family (spouse) regarding goals for therapy and agreed upon       Evaluation duration : 45 Minutes

## 2016-07-21 NOTE — Progress Notes (Signed)
Progress Note  Surgical Intensive Care Unit    Patient Name: Murlin Schrieber  MRN: [id number]     Admitting Service: [place] Attending Provider: Rae Halsted    Mechanism of Injury: <principal problem not specified>    Date: 07/21/16  Hospital Day:   15 days - Admitted on: 07/06/2016    Injuries/Problems: Problem List:  2017-12: Peripheral vascular disease (CMS-HCC)  2017-11: Peripheral artery disease (CMS-HCC)  Clostridium difficile colitis  Alcoholism /alcohol abuse (CMS-HCC)  Alcohol withdrawal delirium, acute, hyperactive (CMS-HCC)    OBJECTIVE:     Vitals signs:     Latest Entry  Range (last 24 hours)    Temperature: 98.6 F (37 C)  Temp  Avg: 99 F (37.2 C)  Min: 98.4 F (36.9 C)  Max: 99.5 F (37.5 C)    Blood pressure (BP): 128/83  BP  Min: 116/92  Max: 166/97    Heart Rate: 113  Pulse  Avg: 118.1  Min: 104  Max: 138    Respirations: 29  Resp  Avg: 26.8  Min: 17  Max: 52    SpO2: 98 %  SpO2  Avg: 97.6 %  Min: 82 %  Max: 100 %       No Data Recorded       No Data Recorded       No Data Recorded     Weight: 101 kg (222 lb 10.6 oz)  Percentage Weight Change (%): -0.39 %    I&O:  Intake/Output       07/20/16 0600 - 07/21/16 0559 07/21/16 0600 - 07/22/16 0559      3299-2426 8341-9622 Total 0600-1759 2979-8921 Total       Intake    P.O.  120  200 320  --  -- --    I.V.  639.9  1423 2062.9  --  -- --    Total Intake 759.9 1623 2382.9 -- -- --       Output    Urine  1065  1375 2440  --  -- --    Stool  200  600 800  --  -- --    Total Output 1265 1975 3240 -- -- --       Net I/O     -505.1 -352 -857.1 -- -- --            Labs:  CBC  Recent Labs      07/19/16   0424  07/20/16   0339  07/21/16   0408   WBCCOUNT  19.2*  23.2*  19.9*   HGB  7.6*  7.3*  7.4*   PLCTEL  447*  516*  574*   BANDP  3.9   --    --    LYMPP2  6.7  8.4  18.3   MONP2  6.7  10.3  2.9          Chemistry  Recent Labs      07/20/16   0339  07/21/16   0408   SODIUM  144  143   K  3.2*  3.2*   CL  106  107   CO2  32*  30   BUN  7  6*   CREAT  0.7   0.7   GLU  124*  115   [place]  8.1*  8.3*   MG  1.9  1.9   PHOS  1.8*  3.1     No results for input(s): ALK, AST, ALT, TBILI, DBILI, ALB in the  last 72 hours.    Coags  Recent Labs      07/20/16   1625  07/20/16   2306   PTT  85.0*  79.9*       Medications:  Scheduled Meds  . acetaminophen  650 mg Q6H   . aspirin  81 mg Daily   . chlordiazePOXIDE  25 mg BID   . chlorhexidine  30 mL BID   . folic acid  1 mg Daily   . influenza vaccine >=3 yrs  0.5 mL Prior to discharge   . LORazepam  2 mg Q6H   . metoprolol tartrate  25 mg Q12H   . metronidazole (FLAGYL) IVPB  500 mg Q8H NR   . pantoprazole  40 mg Daily   . povidone-iodine  1 Application BID   . QUEtiapine  150 mg Q12H   . thiamine  100 mg Daily   . vancomycin 500 mg in NS 100 mL rectal enema  500 mg Q6H NR   . vancomycin  500 mg Q6H NR     PRN Meds  . HYDROmorphone  0.3 mg Q2H PRN   . LORazepam  2 mg Q1H PRN    Or   . LORazepam  4 mg Q1H PRN   . metoprolol  5 mg Q4H PRN   . ondansetron  4 mg Q6H PRN   . oxyCODONE  5 mg Q6H PRN   . sodium chloride (PF)  10 mL PRN   . sodium chloride (PF)  20 mL PRN   . sodium chloride (PF)  50 mL Once PRN     IV Meds  . dextrose-sodium chloride 5%-0.45% 40 mL/hr at 07/21/16 0738   . heparin (porcine) 1,900 Units/hr (07/21/16 0500)   . sodium chloride 10 mL/hr at 07/21/16 0500       Allergies:  Allergies   Allergen Reactions   . No Known Allergies [Other] Other     Received name: No Known Allergies       ASSESSMENT / PLAN / RECOMMENDATIONS:  8M with a traumatic left lower extremity injury 3 years prior to presents with pain to left lower extremity concerning for claudication.  S/p left popliteal to distal PT bypass  S/p Bypass revision, left popliteal to peroneal and peroneal to PT jump graft  C.diff colitis, acute respiratory failure, sepsis, DT    07/06/16  LEFT POPLITEAL TO DISTAL POSTERIOR TIBIAL ARTERY BYPASS LEFT SAPHENOUS VEIN HARVEST / LEFT POPLITEAL ARTERY ANGIOPLASTY/LEFT DISTAL POSTERIOR TIBIAL ARTERY ANGIOPLASTY            07/07/16  Redo Left transinterosseous below-knee popliteal-peroneal and sequential peroneal-distal posterior tibal-medial tarsal reverse saphenous vein bypass; Left distal segmental fibula resection to expose distal peroneal artery,  Thrombectomy of left below-knee popliteal-posterior tibial reverse saphenous vein bypass    Neuro:   GCS 14  Delirium tremens. Previously on precedex gtt/now:  -librium /schd/DC 12/16  - Ativan /schd/ DC 12/16  -seroquel 177m q8hrs  -aspirin, gabapentin  -tylenol schd,   - ativan/prn  -Thiamine folate  -Head CT 12/7 Negative    CV:BP  Min: 116/92  Max: 166/97  Pulse  Avg: 118.1  Min: 104  Max: 138  110s-140s/50s-70s  HR 100s-120s  - metop 256mBID    #LLE claudication s/p bypass   - bypass likely thrombosed again  - arterial duplex 07/08/16: There is distal arterial flow noted in the posterior tibial artery. The bypass graft is not visualized.Mild stenosis at the left mid-distal SFA.  -  ABI with AT on LLE is 0.63 07/08/16  - q1 neurovscular checks  - on heparin gtt 1700  - pulse ox monitor on LLE (currently at 100%)   -DVT Bilt LE + UE [place] negative 07/12/16  -CTA runoff 07/12/16 shows Nonopacified left popliteal to distal posterior tibial artery bypass, suggesting graft failure.    Pulmonary:  Respiratory failure resolved and extubated on 12/14  RA sating 100%  CT chest 07/12/16 shows no findings              FEN/GI:  Dysphagia 3  KUB 12/7 shows worsening colonic illeus. CT abdomen 07/12/16 shows pan colitis.  Fluids TKO on 12/16 since tolerated diet  Protonix DCed since tolerated diet   Replete electrolytes  K 3.2 replete, phos 3.1  Swallow study  ABD XRAY 12/15: Nonspecific bowel gas pattern with persistent disproportionate gaseous distention of the transverse colon suggestive of focal ileus.    Diet:Diet Order Therapeutic; Dysphagia; Dysphagia 3 Advanced (chopped); Liquid Consistency: Thin     Renal:   previously on rhabdo protocol for rising CK now resolved  Intake/Output Summary  (Last 24 hours) at 07/20/16 1194  Last data filed at 07/20/16 0600   Gross per 24 hour   Intake          2215 ml   Output             3040 ml   Net          ml     Stool 600L   Foley IN PLACE/ Would rec remove Foley  - Cr 1.3 -> 1.0 -> 1.0 ->0.9 -> 0.7 ->  0.8 ->0.7    Hematology: received 1 u PRBC   hb 10.6 -> 8.9 ->8.0 -> 7.0 -> 9.3 ->8.3 ->7.8 ->7.7 ->7.3->6.7 -> 1u ->8.0 ->8.4 ->8.0 -> 8.4 -> 7.6 ->7.3->7.0   received 1 uPRBCs 12/5, 12/10        ID: Temp  Avg: 99 F (37.2 C)  Min: 98.4 F (36.9 C)  Max: 99.5 F (37.5 C)  afebrile WBC 17.6 -> 13.3 -> 18.8 -> 23.1 -> 26 ->22 ->23 ->21.9 -> 19.2 -> 23  ; with multiple foul loose stool; c. Diff colitis  Micro: BCx 12/1 NGTD, BC 12/5 NGTD  -UA 12/5 and 12/7 negative   -C.diff positive:   D/C'd vanco enema,   -ON oral vanco, iv flagyl((Plan finish course of PO vanco/IV flagyl x14d ))  - ID recs  - continue PO Vancomycin 554m q6 hours, and IV Flagyl 5032mq8hours   - can dc the Vancomycin enema as there is no evidence of ileus  - continue supportive measures (fluids, replacement of electrolytes)  - daily abdominal exam/KUB to monitor   - avoid addition of broad-spectrum antibiotics as this may impede improvement of CDI    -BCx 12/13: NGTD  -sputum Cx 12/13: 1+ gram neg rods. Now growing 3+ Haemophilus influenza  -UA 12/13: neg    Endocrine:  FSBS: < 180    MSK:  PT/OT: yes  Weight Bearing Status:  weight bear as tolerated. ABI 0.6. Xray shows right knee effusion  -f/u Kub       ICU Care:  - PICC line 12/7  Feedings: yes  Analgesia: Yes  Sedation: Yes  Thromboprophylaxis:SCDs, early mobility protocol and subcutaneous heparin  Head-of-bed elevation: Yes  Ulcer prophylaxis: no  Glycemic control: no  GI Bowel care: Yes  Indwelling catheter removal: no  ID: de-escalation of Abx: yes  Spontaneous breathing trial:  N/A    Family discussion: yes    DISPO: SICU          neurovasc checks s/p bypass lle  abx for c diff colitis  Ala   D/c benzos  Tolerating po  heparin

## 2016-07-21 NOTE — Progress Notes (Signed)
DAILY PROGRESS NOTE     07/21/16     Current Hospital Stay:   15 days - Admitted on: 07/06/2016    Subjective:  remains confused  Able to communicate      Objective:      Current Medications:  . acetaminophen  650 mg Q6H   . aspirin  81 mg Daily   . chlorhexidine  30 mL BID   . folic acid  1 mg Daily   . influenza vaccine >=3 yrs  0.5 mL Prior to discharge   . metoprolol tartrate  25 mg Q12H   . metronidazole (FLAGYL) IVPB  500 mg Q8H NR   . potassium chloride  20 mEq Q2H   . povidone-iodine  1 Application BID   . QUEtiapine  150 mg Q12H   . thiamine  100 mg Daily   . vancomycin 500 mg in NS 100 mL rectal enema  500 mg Q6H NR   . vancomycin  500 mg Q6H NR     . dextrose-sodium chloride 5%-0.45% 10 mL/hr at 07/21/16 1208   . heparin (porcine) 2,100 Units/hr (07/21/16 1252)   . sodium chloride 10 mL/hr at 07/21/16 1200     . HYDROmorphone  0.3 mg Q2H PRN   . LORazepam  2 mg Q1H PRN    Or   . LORazepam  4 mg Q1H PRN   . metoprolol  5 mg Q4H PRN   . ondansetron  4 mg Q6H PRN   . oxyCODONE  5 mg Q6H PRN   . sodium chloride (PF)  10 mL PRN   . sodium chloride (PF)  20 mL PRN   . sodium chloride (PF)  50 mL Once PRN       Review of Systems:   Nutrition: Diet Order Therapeutic; Dysphagia; Dysphagia 3 Advanced (chopped); Liquid Consistency: Thin   Gastrointestinal: Diarrhea  Mobility:  Progressive  Pain: Data Unavailable    Vital Signs:  Temperature:  [98.2 F (36.8 C)-99.5 F (37.5 C)] 98.6 F (37 C) (12/16 1300)  Blood pressure (BP): (116-166)/(66-97) 131/81 (12/16 1200)  Heart Rate:  [102-138] 104 (12/16 1300)  Respirations:  [17-37] 30 (12/16 1300)  Pain Score: 0 (12/16 1200)  O2 Device: None (Room air) (12/16 1200)  SpO2:  [97 %-100 %] 99 % (12/16 1300)  RASS Score: Restless    Wt Readings from Last 1 Encounters:   07/15/16 101 kg (222 lb 10.6 oz)       Intake/Output (Current Shift):  12/15 0600 - 12/16 0559  In: 2382.9 [P.O.:320; I.V.:2062.9]  Out: 3240 [Urine:2440]    Physical Exam:  General Appearance: sedated,  confused, comfortable  Heart: Sinus tachycardia .  Lungs: bilateral breath sounds present.   Abdomen: soft, distended. Non-peritoneal, Rectal tube in place with brown liquid stool  Extremities: RLE: dopplerable PT and DP signals present  LLE: wound without erythema. Intact. Dressing in place. Dopplerable AT signal.     Diagnostic Data:  Laboratory data:   Lab Results   Component Value Date    K 3.2 (L) 07/21/2016    CL 107 07/21/2016    BUN 6 (L) 07/21/2016    CREAT 0.7 07/21/2016    GLU 115 07/21/2016    [employer] 8.3 (L) 07/21/2016     Lab Results   Component Value Date    HGB 7.4 (L) 07/21/2016    HCT 22.3 (L) 07/21/2016     Lab Results   Component Value Date    HGB 7.4 (L)  07/21/2016    HCT 22.3 (L) 07/21/2016    NRBC 1 (HH) 07/21/2016     No results found for: AST, ALT, ALK, TBILI, DBILI, TP, ALB  Lab Results   Component Value Date    PTT 53.7 (H) 07/21/2016     No results found for: ARTPH, ARTPO2, ARTPCO2  No results found for: PH, PO2, PCO2  No results found for: TSH, FREET4, T3  No results found for: CPK, [facility], TROPONIN  No results found for: CPK, [place], TROPONIN  No results found for: PHUA, SGUA, GLUCOSEUA, KETONEUA, [place], [place], LEUKESTUA, NITRITEUA, [place], RBCUA, EPITHCELLSUA, CRYSTALSUA, COMMENTSUA      Assessment and Plan:    ASSESSMENT / PLAN:  66 year oldmale admitted with 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 Patient underwent attempted Popliteal-posterior tibial artery bypass on 12/1 with resulting graft failure and reoperation on 12/2 and underwent Popliteal to peroneal bypass with jump graft from peroneal artery to posterior tibial artery - this graft has subsequently gone down as well.     Postoperatively he developed:    #acute alcohol withdrawal due to his undisclosed alcohol abuse. Due to his clinical decline and the need for further imaging studies he was intubated and sedated which has been controlling his agitation  fairly well.     #C. Diff colitis      Neuro: PO and IV pain medications cont. CIWA, wean as able. Cog eval  CV: Tachycardic due to infection and alcoholic withdrawal, improving  Pulm: H. Flu 3+ in sputum, but w/o focal consolidation. deferring tx for now  GI: Dysphagia diet, PO and PR VAnco, Flagyl for C. Diff, consider dec of rectal tune soon  Renal: Normal creatinine continue Foley catheter for critical illness and close urine monitoring.   Heme:acute blood loss anemia - some element of dilutional component given no active signs of bleeding,Continue heparin gtt to decrease risk of LLE thrombosis  ID: C diff colitis -continue vancomycin and Flagyl, afeb, wbc down-trending. May need to consider colonic lavage if acutely decompensates will need emergent colectomy  Endocrine: ISS  Disposition critical care        I

## 2016-07-21 NOTE — Plan of Care (Signed)
Problem: Promotion of health and safety  Goal: Promotion of Health and Safety  The patient remains safe, receives appropriate treatment and achieves optimal outcomes (physically, psychosocially, and spiritually) within the limitations of the disease process by discharge.   Outcome: Unable to meet goal at this time.   07/21/16 0558   Adult/Peds Plan of Care   Standard of Care/Policy Critical Care SOC;[facility];Restraints SOC;Falls reduction   Outcome Evaluation (rationale for progressing/not progessing pt tolerated room air saturating well   Patient Specific Goal, Promotion of Health & Safety still need a sitter, pt is agitated and restless, CIWA protocol in place   Additional Individualized Interventions/Recommendations  retraints still on pt still trying to get up in bed and fidgeting his foley and lines   Additional Individualized Interventions/Recommendations doppler checks still q 1 hr, pt might get more rest by decreasing stimulation     Nursing Shift Summary    No data found.    No data found.      Rounding for the past 12 hrs:   Physician Rounding   07/21/16 0000 My nursing colleague or I joined the physician during  his/her interaction/rounds with the patient at least once during my shift       Shift Comments for the past 12 hrs:   Comments   07/20/16 2000 pt is very agitated earlier was given prn meds by previous nurse, now pt seems to calm down, sitter at bedside, able to follow commands now and helping with the turns and linen change   07/21/16 0000 Dr. Tomi BambergerMayers clarified order for suppository tylenol, pt has flexeseal   07/21/16 0500 still restless and agitated the whole night despite giving the prn and scheduled meds

## 2016-07-21 NOTE — Plan of Care (Signed)
Problem: Promotion of health and safety  Goal: Promotion of Health and Safety  The patient remains safe, receives appropriate treatment and achieves optimal outcomes (physically, psychosocially, and spiritually) within the limitations of the disease process by discharge.   Outcome: Progressing toward goal, anticipate improvement over: Next 24-48 hours

## 2016-07-22 LAB — CBC WITH DIFF, BLOOD
Basophils %: 0 %
Basophils Absolute: 0 10*3/uL (ref 0.0–0.2)
Eosinophils %: 2.8 %
Eosinophils Absolute: 0.6 10*3/uL — ABNORMAL HIGH (ref 0.0–0.5)
Hematocrit: 23.6 % — ABNORMAL LOW (ref 39.5–50.0)
Hgb: 7.7 G/DL — ABNORMAL LOW (ref 13.5–16.9)
Lymphocytes %.: 9.5 %
Lymphocytes Absolute: 2.1 10*3/uL (ref 0.9–3.3)
MCH: 30.8 PG (ref 27.0–33.5)
MCHC: 32.8 G/DL (ref 32.0–35.5)
MCV: 93.8 FL (ref 81.5–97.0)
MPV: 9.5 FL (ref 7.2–11.7)
Monocytes %: 2.9 %
Monocytes Absolute: 0.6 10*3/uL (ref 0.0–0.8)
PLT Count: 660 10*3/uL — ABNORMAL HIGH (ref 150–400)
RBC: 2.52 10*6/uL — ABNORMAL LOW (ref 3.70–5.00)
RDW-CV: 15.3 % — ABNORMAL HIGH (ref 11.6–14.4)
Seg Neutro % (M): 84.8 %
Seg Neutro Abs (M): 18.4 10*3/uL — ABNORMAL HIGH (ref 2.0–7.5)
White Bld Cell Count: 21.7 10*3/uL — ABNORMAL HIGH (ref 4.0–10.5)

## 2016-07-22 LAB — SPUTUM CULTURE W GRAM STAIN: Culture Result: NEGATIVE

## 2016-07-22 LAB — BASIC METABOLIC PANEL, BLOOD
BUN: 7 mg/dL (ref 7–25)
CO2: 28 mmol/L (ref 21–31)
Calcium: 8.6 mg/dL (ref 8.6–10.3)
Chloride: 107 mmol/L (ref 98–107)
Creat: 0.7 mg/dL (ref 0.7–1.3)
Electrolyte Balance: 8 mmol/L (ref 2–12)
Glucose: 110 mg/dL (ref 70–115)
Potassium: 3.3 mmol/L — ABNORMAL LOW (ref 3.5–5.1)
Sodium: 143 mmol/L (ref 136–145)
eGFR - high estimate: >60 (ref >59)
eGFR - low estimate: >60 (ref >59)

## 2016-07-22 LAB — APTT, BLOOD
PTT: 44.8 s — ABNORMAL HIGH (ref 24.1–36.3)
PTT: 129.4 s (ref 24.1–36.3)
PTT: 80.4 s — ABNORMAL HIGH (ref 24.1–36.3)

## 2016-07-22 LAB — PTT CRT VAL CALL

## 2016-07-22 LAB — PHOSPHORUS, BLOOD: Phosphorus: 3.4 MG/DL (ref 2.5–5.0)

## 2016-07-22 LAB — MAGNESIUM, BLOOD: Magnesium: 1.9 mg/dL (ref 1.9–2.7)

## 2016-07-22 MED ORDER — HEPARIN 25,000 UNITS/250 ML 1/2 NS PREMIX INFUSION (~~LOC~~)
2050.0000 [IU]/h | INTRAMUSCULAR | Status: DC
Start: 2016-07-22 — End: 2016-07-25
  Administered 2016-07-22 – 2016-07-24 (×6): 2050 [IU]/h via INTRAVENOUS
  Filled 2016-07-22 (×6): qty 250

## 2016-07-22 MED ORDER — HEPARIN SODIUM (PORCINE) 1000 UNIT/ML IJ SOLN
7000.00 [IU] | Freq: Once | INTRAMUSCULAR | Status: AC
Start: 2016-07-22 — End: 2016-07-22
  Administered 2016-07-22: 7000 [IU] via INTRAVENOUS
  Filled 2016-07-22: qty 7

## 2016-07-22 MED ORDER — POTASSIUM CHLORIDE 20 MEQ/100ML IV SOLN
20.00 meq | INTRAVENOUS | Status: AC
Start: 2016-07-22 — End: 2016-07-22
  Administered 2016-07-22 (×2): 20 meq via INTRAVENOUS
  Filled 2016-07-22 (×2): qty 100

## 2016-07-22 NOTE — Progress Notes (Incomplete)
Heparin per Pharmacy Protocol - Progress Note    40 year old year old male continues on heparin anticoagulation for lower extremity ischemia   --heparin since 12/1; today is day 17; aPTT Goal:   65-90 seconds   --admitted 07/06/2016  --190 cm  --Dosing wt=99.7 kg      Heparin Rates and aPTT Results  2350 units/h  --aPTT *** on 12/17 at ***; drawn from PICC    H/H/plts: 7.7/23.6/660K      Assessment/Plan  -aPTT is ***   -No sis/x of bleeding per RN  -Patient continues on aspirin 81mg  daily  -Increase heparin to 2100 units/hour per protocol  -check aPTT q***h, next draw at ***    Continue protocol to achieve goal aPTT 65-90.  aPTT blood draws should be done away from IV fluid site.      For questions, please call pharmacy at [phone]

## 2016-07-22 NOTE — Progress Notes (Signed)
Progress Note  Surgical Intensive Care Unit    Patient Name: Jerome Hunt  MRN: [id number]     Admitting Service: [place] Attending Provider: Rae Halsted    Mechanism of Injury: <principal problem not specified>    Date: 07/22/16  Hospital Day:   16 days - Admitted on: 07/06/2016    Injuries/Problems: Problem List:  2017-12: Peripheral vascular disease (CMS-HCC)  2017-11: Peripheral artery disease (CMS-HCC)  Clostridium difficile colitis  Alcoholism /alcohol abuse (CMS-HCC)  Alcohol withdrawal delirium, acute, hyperactive (CMS-HCC)    OBJECTIVE:     Vitals signs:     Latest Entry  Range (last 24 hours)    Temperature: 93 F (33.9 C)  Temp  Avg: 98.1 F (36.7 C)  Min: 93 F (33.9 C)  Max: 99 F (37.2 C)    Blood pressure (BP): 137/78  BP  Min: 126/70  Max: 146/75    Heart Rate: 103  Pulse  Avg: 109.7  Min: 102  Max: 130    Respirations: 27  Resp  Avg: 26.9  Min: 16  Max: 39    SpO2: 97 %  SpO2  Avg: 98.7 %  Min: 91 %  Max: 100 %       No Data Recorded       No Data Recorded       No Data Recorded     Weight: 99.7 kg (219 lb 12.8 oz)  Percentage Weight Change (%): -1.29 %    I&O:  Intake/Output       07/21/16 0600 - 07/22/16 0559 07/22/16 0600 - 07/23/16 0559      4656-8127 5170-0174 Total 0600-1759 9449-6759 Total       Intake    P.O.  118  0 118  --  -- --    I.V.  499.5  447.8 947.3  --  -- --    Total Intake 617.5 447.8 1065.3 -- -- --       Output    Urine  1750  1525 3275  325  -- 325    Stool  --  550 550  --  -- --    Total Output 1750 2075 3825 325 -- 325       Net I/O     -1132.5 -1627.2 -2759.7 -325 -- -325            Labs:  CBC  Recent Labs      07/20/16   0339  07/21/16   0408  07/22/16   0520   WBCCOUNT  23.2*  19.9*  21.7*   HGB  7.3*  7.4*  7.7*   PLCTEL  516*  574*  660*   LYMPP2  8.4  18.3   --    MONP2  10.3  2.9   --           Chemistry  Recent Labs      07/21/16   0408  07/22/16   0520   SODIUM  143  143   K  3.2*  3.3*   CL  107  107   CO2  30  28   BUN  6*  7   CREAT  0.7  0.7   GLU  115   110   [place]  8.3*  8.6   MG  1.9  1.9   PHOS  3.1  3.4     No results for input(s): ALK, AST, ALT, TBILI, DBILI, ALB in the last 72 hours.    Coags  Recent  Labs      07/21/16   1847  07/22/16   0520   PTT  116.5*  44.8*       Medications:  Scheduled Meds  . acetaminophen  650 mg Q6H   . aspirin  81 mg Daily   . folic acid  1 mg Daily   . heparin  7,000 Units Once   . influenza vaccine >=3 yrs  0.5 mL Prior to discharge   . metoprolol tartrate  25 mg Q12H   . metronidazole (FLAGYL) IVPB  500 mg Q8H NR   . povidone-iodine  1 Application BID   . QUEtiapine  150 mg Q12H   . thiamine  100 mg Daily   . vancomycin 500 mg in NS 100 mL rectal enema  500 mg Q6H NR   . vancomycin  500 mg Q6H NR     PRN Meds  . HYDROmorphone  0.3 mg Q2H PRN   . LORazepam  2 mg Q1H PRN    Or   . LORazepam  4 mg Q1H PRN   . metoprolol  5 mg Q4H PRN   . ondansetron  4 mg Q6H PRN   . oxyCODONE  5 mg Q6H PRN   . sodium chloride (PF)  10 mL PRN   . sodium chloride (PF)  20 mL PRN     IV Meds  . dextrose-sodium chloride 5%-0.45% 10 mL/hr at 07/21/16 1208   . heparin (porcine) 2,000 Units/hr (07/21/16 2130)       Allergies:  Allergies   Allergen Reactions   . No Known Allergies [Other] Other     Received name: No Known Allergies       ASSESSMENT / PLAN / RECOMMENDATIONS:  40M with a traumatic left lower extremity injury 3 years prior to presents with pain to left lower extremity concerning for claudication.  S/p left popliteal to distal PT bypass  S/p Bypass revision, left popliteal to peroneal and peroneal to PT jump graft  C.diff colitis, acute respiratory failure, sepsis, DT    07/06/16  LEFT POPLITEAL TO DISTAL POSTERIOR TIBIAL ARTERY BYPASS LEFT SAPHENOUS VEIN HARVEST / LEFT POPLITEAL ARTERY ANGIOPLASTY/LEFT DISTAL POSTERIOR TIBIAL ARTERY ANGIOPLASTY       07/07/16  Redo Left transinterosseous below-knee popliteal-peroneal and sequential peroneal-distal posterior tibal-medial tarsal reverse saphenous vein bypass; Left distal segmental fibula  resection to expose distal peroneal artery,  Thrombectomy of left below-knee popliteal-posterior tibial reverse saphenous vein bypass    Neuro:   GCS 14  Delirium tremens. Previously on precedex gtt/now:  -librium /schd/DC 12/16  - Ativan /schd/ DC 12/16  -seroquel 149m q8hrs  -aspirin, gabapentin  -tylenol schd,   - ativan/prn  -Thiamine folate  -Head CT 12/7 Negative    CV:BP  Min: 126/70  Max: 146/75  Pulse  Avg: 109.7  Min: 102  Max: 130  110s-140s/50s-70s  HR 100s-120s  - metop 238mBID    #LLE claudication s/p bypass   - bypass likely thrombosed again  - arterial duplex 07/08/16: There is distal arterial flow noted in the posterior tibial artery. The bypass graft is not visualized.Mild stenosis at the left mid-distal SFA.  - ABI with AT on LLE is 0.63 07/08/16  - q1 neurovscular checks  - on heparin gtt 1700 PTT: 44.8  - pulse ox monitor on LLE (currently at 100%)   -DVT Bilt LE + UE [place] 07/12/16  -CTA runoff 07/12/16 shows Nonopacified left popliteal to distal posterior tibial artery bypass, suggesting graft  failure.    Pulmonary:  Respiratory failure resolved and extubated on 12/14  RA sating 100%  CT chest 07/12/16 shows no findings              FEN/GI:  Dysphagia 3  KUB 12/7 shows worsening colonic illeus. CT abdomen 07/12/16 shows pan colitis.  Fluids TKO on 12/16 since tolerated diet  Protonix DCed since tolerated diet   Replete electrolytes  K 3.3 replete, phos 3.1  Swallow study  ABD XRAY 12/15: Nonspecific bowel gas pattern with persistent disproportionate gaseous distention of the transverse colon suggestive of focal ileus.    Diet:Diet Order Therapeutic; Dysphagia; Dysphagia 3 Advanced (chopped); Liquid Consistency: Thin     Renal:   previously on rhabdo protocol for rising CK now resolved  Intake/Output Summary (Last 24 hours) at 07/20/16 4627  Last data filed at 07/20/16 0600   Gross per 24 hour   Intake          1065 ml   Output             4025 ml   Net          -2920m     Stool 500L    Foley IN PLACE/ Would rec remove Foley  - Cr 1.3 -> 1.0 -> 1.0 ->0.9 -> 0.7 ->  0.8 ->0.7->0.7    Hematology: received 1 u PRBC   hb 10.6 -> 8.9 ->8.0 -> 7.0 -> 9.3 ->8.3 ->7.8 ->7.7 ->7.3->6.7 -> 1u ->8.0 ->8.4 ->8.0 -> 8.4 -> 7.6 ->7.3->7.0->7.7   received 1 uPRBCs 12/5, 12/10        ID: Temp  Avg: 98.1 F (36.7 C)  Min: 93 F (33.9 C)  Max: 99 F (37.2 C)  afebrile WBC 17.6 -> 13.3 -> 18.8 -> 23.1 -> 26 ->22 ->23 ->21.9 -> 19.2 -> 23->21  ; with multiple foul loose stool; c. Diff colitis  Micro: BCx 12/1 NGTD, BC 12/5 NGTD  -UA 12/5 and 12/7 negative   -C.diff positive:   D/C'd vanco enema,   -ON oral vanco, iv flagyl((Plan finish course of PO vanco/IV flagyl x14d ))  - ID recs  - continue PO Vancomycin 5080mq6 hours, and IV Flagyl 50012m8hours   - can dc the Vancomycin enema as there is no evidence of ileus  - continue supportive measures (fluids, replacement of electrolytes)  - daily abdominal exam/KUB to monitor   - avoid addition of broad-spectrum antibiotics as this may impede improvement of CDI    -BCx 12/13: NGTD  -sputum Cx 12/13: 1+ gram neg rods. Now growing 3+ Haemophilus influenza  -UA 12/13: neg    Endocrine:  FSBS: < 180    MSK:  PT/OT: yes  Weight Bearing Status:  weight bear as tolerated. ABI 0.6. Xray shows right knee effusion        ICU Care:  - PICC line 12/7  Feedings: yes  Analgesia: Yes  Sedation: Yes  Thromboprophylaxis:SCDs, early mobility protocol and subcutaneous heparin  Head-of-bed elevation: Yes  Ulcer prophylaxis: no  Glycemic control: no  GI Bowel care: Yes  Indwelling catheter removal: no  ID: de-escalation of Abx: yes  Spontaneous breathing trial: N/A    Family discussion: yes    DISPO: SICU/ possible downgrade per vascular team          neurovasc checks s/p bypass lle  abx for c diff colitis  Ala   D/c benzos  Tolerating po  Heparin  Neuro mon for ams with agitation, prn ativan

## 2016-07-22 NOTE — Plan of Care (Signed)
Problem: Promotion of health and safety  Goal: Promotion of Health and Safety  The patient remains safe, receives appropriate treatment and achieves optimal outcomes (physically, psychosocially, and spiritually) within the limitations of the disease process by discharge.   Outcome: Unable to meet goal at this time.  Nursing Shift Summary    No data found.    Critical Value Notification for the past 12 hrs:   Lab Test Name Test Result Provider Name Comments   07/21/16 2000 Other (see comment) 116.5 Dr. Sofie HartiganAl-Khouja acknowledged         Rounding for the past 12 hrs:   Physician Rounding   07/22/16 0600 My nursing colleague or I joined the physician during  his/her interaction/rounds with the patient at least once during my shift       Shift Comments for the past 12 hrs:   Comments   07/22/16 0600 Patient never sleep the whole night

## 2016-07-22 NOTE — Progress Notes (Signed)
DAILY PROGRESS NOTE     07/21/16     Current Hospital Stay:   16 days - Admitted on: 07/06/2016    Subjective:  remains confused  Sleeping on exam,  incomprehensible sounds  Is able to indicate pain is currently controlled    Objective:      Current Medications:  . acetaminophen  650 mg Q6H   . aspirin  81 mg Daily   . folic acid  1 mg Daily   . influenza vaccine >=3 yrs  0.5 mL Prior to discharge   . metoprolol tartrate  25 mg Q12H   . metronidazole (FLAGYL) IVPB  500 mg Q8H NR   . povidone-iodine  1 Application BID   . QUEtiapine  150 mg Q12H   . thiamine  100 mg Daily   . vancomycin 500 mg in NS 100 mL rectal enema  500 mg Q6H NR   . vancomycin  500 mg Q6H NR     . dextrose-sodium chloride 5%-0.45% 10 mL/hr at 07/22/16 1200   . heparin (porcine)       . HYDROmorphone  0.3 mg Q2H PRN   . LORazepam  2 mg Q1H PRN    Or   . LORazepam  4 mg Q1H PRN   . metoprolol  5 mg Q4H PRN   . ondansetron  4 mg Q6H PRN   . oxyCODONE  5 mg Q6H PRN   . sodium chloride (PF)  10 mL PRN   . sodium chloride (PF)  20 mL PRN       Review of Systems:   Nutrition: Diet Order Therapeutic; Dysphagia; Dysphagia 3 Advanced (chopped); Liquid Consistency: Thin   Gastrointestinal: Diarrhea  Mobility:  Progressive  Pain: Data Unavailable    Vital Signs:  Temperature:  [93 F (33.9 C)-99.1 F (37.3 C)] 98.1 F (36.7 C) (12/17 1200)  Blood pressure (BP): (104-146)/(55-90) 130/72 (12/17 1500)  Heart Rate:  [97-130] 117 (12/17 1614)  Respirations:  [16-33] 32 (12/17 1614)  Pain Score: 7 (12/17 1434)  O2 Device: None (Room air) (12/17 1614)  SpO2:  [93 %-100 %] 98 % (12/17 1614)  RASS Score: Agitated    Wt Readings from Last 1 Encounters:   07/22/16 99.7 kg (219 lb 12.8 oz)       Intake/Output (Current Shift):  12/16 0600 - 12/17 0559  In: 1009.3 [P.O.:118; I.V.:891.3]  Out: 3825 [Urine:3275]    Physical Exam:  General Appearance: sedated, confused, comfortable  Heart: Sinus tachycardia .  Lungs: bilateral breath sounds present.   Abdomen: soft,  decreased distention. Non-peritoneal, Rectal tube in place with brown liquid stool  Extremities: RLE: dopplerable PT and DP signals present  LLE: wound without erythema. Intact. Dressing in place. Dopplerable AT signal.     Diagnostic Data:  Laboratory data:   Lab Results   Component Value Date    K 3.3 (L) 07/22/2016    CL 107 07/22/2016    BUN 7 07/22/2016    CREAT 0.7 07/22/2016    GLU 110 07/22/2016    [place] 8.6 07/22/2016     Lab Results   Component Value Date    HGB 7.7 (L) 07/22/2016    HCT 23.6 (L) 07/22/2016     Lab Results   Component Value Date    HGB 7.7 (L) 07/22/2016    HCT 23.6 (L) 07/22/2016    NRBC 1 (HH) 07/21/2016     No results found for: AST, ALT, ALK, TBILI, DBILI, TP, ALB  Lab Results   Component Value Date    PTT 129.4 (HH) 07/22/2016     No results found for: ARTPH, ARTPO2, ARTPCO2  No results found for: PH, PO2, PCO2  No results found for: TSH, FREET4, T3  No results found for: CPK, CKMBH, TROPONIN  No results found for: CPK, CKMBH, TROPONIN  No results found for: PHUA, SGUA, GLUCOSEUA, KETONEUA, BLOODUA, [place], LEUKESTUA, NITRITEUA, WBCUA, RBCUA, EPITHCELLSUA, CRYSTALSUA, COMMENTSUA      Assessment and Plan:    ASSESSMENT / PLAN:  8 year oldmale admitted with 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 Patient underwent attempted Popliteal-posterior tibial artery bypass on 12/1 with resulting graft failure and reoperation on 12/2 and underwent Popliteal to peroneal bypass with jump graft from peroneal artery to posterior tibial artery - this graft has subsequently gone down as well.     Postoperatively he developed:    #acute alcohol withdrawal - due to his undisclosed alcohol abuse. Due to his clinical decline and the need for further imaging studies he was intubated and sedated which has been controlling his agitation fairly well.     #C. Diff colitis      Neuro: PO and IV pain medications cont. CIWA, wean as able.  Cog eval - CL 60%-79% impaired, limited or restricted  CV: Tachycardic due to infection and alcoholic withdrawal, improving, low 100s when calm, up to 130s with bouts of agitation  Pulm: H. Flu 3+ in sputum, but w/o focal consolidation. deferring tx for now. Consider if WBC continues to remain high  GI: Dysphagia diet, PO and PR VAnco, Flagyl for C. Diff  Renal: Normal creatinine continue Foley catheter for critical illness and close urine monitoring.   Heme:acute blood loss anemia - some element of dilutional component given no active signs of bleeding,Continue heparin gtt to decrease risk of LLE thrombosis  ID: C diff colitis -continue vancomycin and Flagyl, afeb, wbc down-trending. May need to consider colonic lavage if acutely decompensates will need emergent colectomy  Endocrine: ISS    Disposition critical care        I

## 2016-07-22 NOTE — Interdisciplinary (Signed)
Physical Therapy Daily Follow up Note        Post Acute Discharge Considerations  Anticipated required level of cognitive assistance: All of the time  Anticipated required level of safety assistance: Constant supervision  Anticipated required level of physical assistance: One person assist;All of the time  Anticipated physical barriers impacting discharge: Stairs/steps  Anticipated  level of therapy tolerance and ongoing therapy needs: Would benefit from intermittent Physical Therapy intervention post acute discharge to maximize functional independence    Admit Date: 07/06/2016         Preferred Language:English      Patient stated goal: to walk      Objective Findings: Supine to sit EOB w/ modA. Sat EOB x3815min w/ supervision for seated exercises. pt able to scoot hips to square at EOB w/ supervision. pt unable to tolerate standing or transfer to chair. Sit to supine w/ minA and [facility] w/ modA and pt assisting w/ BUEs and RLE.           PT Progress Report       07/22/16 1500    Assessment     Assessment  pt making slow daily gains to improve current deficits. pt able to sit EOB w/ supervision and making improvements w/ assist required for mobility. Pt currently unsafe to attempt OOB to chair. Will conitnue to progress as cognititve status may also improve. PT will continue to follow.     Rehab Potential Good      07/22/16 1500    Inpatient Treatment Plan    Frequency of treatment 5 times per week    Status of treatment Patient evaluated and will benefit from ongoing skilled therapy      07/22/16 1500    Treatment Plan Discussion    Treatment Plan Discussion & Agreement Patient    Patient/Family Questions No questions      07/22/16 1500    Patient/Family Education    Learner(s) Patient    Patient/family training in appropriate therapeutic interventions Does not demonstrate readiness to learn;Needs reinforcement    Education Topic(s) Benefits of out of bed to chair;Exercise  Program;Mobility      07/22/16 1500    Subjective    Status since last treatment  pt remains confused, but agreeable to dangling at bedside. Sitter present during treatment. [facility] pt appeared to be talking to himself as if having a phone conversation with his mother, but was not actualy on the phone.       07/22/16 1500    Numeric Pain Rating Scale     Pain Intensity - rating at present 0    Pain Intensity - rating at worst  8    Pain Intensity- rating after treatment 4    Frequency  Intermittent    Description  Throbbing    Location  LLE      07/22/16 1500    Inpatient Pain Assessment    Patient status Patient agreeable to treatment;Nursing in agreement for treatment    Patient premedicated prior to treatment with Oral medication      07/22/16 1500    Activity Restrictions    Activity Restrictions None      07/22/16 1500    Inpatient Precautions/Contraindications    Precautions Fall;Behavioral;Isolation    Behavioral Restraints   wrist and posey vest    Fall Socks/charm    Isolation Contact    Equipment/Lines Currently Present at Bedside Lines/drains/airways    Lines/Drains/Airways Catheter;IV;Telemetry   flexi-seal  07/22/16 1500    Inpatient Patient Safety Considerations    Patient call light within reach Yes    Patient in bed at the end of treatment in Supine position    Feet positioned in neutral and heels floating using appropriate device. Yes, Nursing notified of proper position    The patient may be at risk for falls Yes- Notified Nursing   Chelle, RN    Family/caregiver present for treatment None Present      07/22/16 1500    Therapy Plan Communication    Therapy Plan Communication Discussed therapy plan with Nursing      07/22/16 1500    Treatment provided today    Payor  Medicare/Private      07/22/16 1500    Therapeutic Procedures    Therapeutic Activities (97530)  Functional activities       Total TIMED Treatment (min)  30     Therapeutic exercise  (97110)  Flexibility exercises;Strengthening exercises       Total TIMED Treatment (min)  15      Therapeutic exercise   EOB AROM x10 reps: LAQ and ankle pumps.       07/22/16 1500    Focus for Next Treatment    Focus for Next Treatment Balance activities;Therapeutic activities;Therapeutic exercise      07/22/16 1500    Treatment Time     Treatment start time 1045    Total TIMED Treatment  (min) 45    Total Treatment Time (min) 45                    07/22/16 1500    Gait    Gait Distance Ambulated Today 0

## 2016-07-22 NOTE — Progress Notes (Signed)
Heparin per Pharmacy Protocol - Progress Note    40 year old year old male continues on heparin anticoagulation for lower extremity ischemia   --heparin since 12/1; today is day 17; aPTT Goal:   65-90 seconds   --admitted 07/06/2016  --190 cm  --Dosing wt=99.7 kg      Heparin Rates and aPTT Results  2350 units/h  --aPTT 129.4 seconds on 12/17 at 14:40; drawn from peripheral stick    H/H/plts: 7.7/23.6/660K      Assessment/Plan  -aPTT is supratherapeutic    -No sis/x of bleeding per RN  -Patient continues on aspirin 81mg  daily  -HOLD HEPARIN for 1 hour then decrease heparin to 2050 units/hour per protocol  -check aPTT q6h, next draw at 22:00    Continue protocol to achieve goal aPTT 65-90.  aPTT blood draws should be done away from IV fluid site.      For questions, please call pharmacy at [phone]

## 2016-07-23 ENCOUNTER — Inpatient Hospital Stay: Payer: No Typology Code available for payment source

## 2016-07-23 DIAGNOSIS — D72829 Elevated white blood cell count, unspecified: Secondary | ICD-10-CM

## 2016-07-23 LAB — BLOOD CULTURE
Culture Result: NO GROWTH
Culture Result: NO GROWTH

## 2016-07-23 LAB — MAGNESIUM, BLOOD: Magnesium: 1.9 mg/dL (ref 1.9–2.7)

## 2016-07-23 LAB — CBC WITH DIFF, BLOOD
Basophils %: 0 %
Basophils Absolute: 0 10*3/uL (ref 0.0–0.2)
Eosinophils %: 3.8 %
Eosinophils Absolute: 0.8 10*3/uL — ABNORMAL HIGH (ref 0.0–0.5)
Hematocrit: 23.3 % — ABNORMAL LOW (ref 39.5–50.0)
Hgb: 7.8 G/DL — ABNORMAL LOW (ref 13.5–16.9)
Lymphocytes %.: 14 %
Lymphocytes Absolute: 3.1 10*3/uL (ref 0.9–3.3)
MCH: 31.9 PG (ref 27.0–33.5)
MCHC: 33.6 G/DL (ref 32.0–35.5)
MCV: 95 FL (ref 81.5–97.0)
MPV: 8.8 FL (ref 7.2–11.7)
Monocytes %: 3.7 %
Monocytes Absolute: 0.8 10*3/uL (ref 0.0–0.8)
PLT Count: 716 10*3/uL — ABNORMAL HIGH (ref 150–400)
Platelet Morphology: NORMAL
RBC: 2.45 10*6/uL — ABNORMAL LOW (ref 3.70–5.00)
RDW-CV: 15.2 % — ABNORMAL HIGH (ref 11.6–14.4)
Seg Neutro % (M): 78.5 %
Seg Neutro Abs (M): 17.5 10*3/uL — ABNORMAL HIGH (ref 2.0–7.5)
White Bld Cell Count: 22.3 10*3/uL — ABNORMAL HIGH (ref 4.0–10.5)

## 2016-07-23 LAB — BASIC METABOLIC PANEL, BLOOD
BUN: 7 mg/dL (ref 7–25)
CO2: 27 mmol/L (ref 21–31)
Calcium: 8.3 mg/dL — ABNORMAL LOW (ref 8.6–10.3)
Chloride: 107 mmol/L (ref 98–107)
Creat: 0.7 mg/dL (ref 0.7–1.3)
Electrolyte Balance: 7 mmol/L (ref 2–12)
Glucose: 94 mg/dL (ref 70–115)
Potassium: 3.5 mmol/L (ref 3.5–5.1)
Sodium: 141 mmol/L (ref 136–145)
eGFR - high estimate: >60 (ref >59)
eGFR - low estimate: >60 (ref >59)

## 2016-07-23 LAB — APTT, BLOOD
PTT: 77.2 s — ABNORMAL HIGH (ref 24.1–36.3)
PTT: 87.9 s — ABNORMAL HIGH (ref 24.1–36.3)

## 2016-07-23 LAB — PHOSPHORUS, BLOOD: Phosphorus: 3.7 MG/DL (ref 2.5–5.0)

## 2016-07-23 NOTE — Interdisciplinary (Signed)
Occupational Therapy Daily Follow up Note      Post Acute Discharge Considerations  Anticipated Required Level of Cognitive Assistance: All of the time  Anticipated Required Level of Safety Assistance: Constant supervision  Anticipated Required Level of Self Care Assistance: All of the time  Anticipated Physical Barriers Impacting Discharge: Stairs/steps  Anticipated Level of Therapy Tolerance and Ongoing Therapy Needs: Other (comment) (TBD if Pt able to tolerate intensive rehab vs slow based)  Equipment Recommendations: To be determined as patient progresses with therapy    Admit Date: 07/06/2016              Preferred Language:English    Plan  Objective Findings: Pt found supine in bed with B soft mittens, B soft wrist restraints and posey vest. Sitter present throughout session. pt perseverative on his missing bag in the room. At end of session, RN agreed to trial of no B soft wrist restraints with mittens donned. Sitter present to monitor.           Occupational Therapy Plan and Progress Report       07/23/16 1500    Inpatient Treatment Plan    Frequency of treatment  5 times per week    Status of Treatment Patient evaluated and will benefit from ongoing skilled therapy      07/23/16 1500    Treatment Plan Discussion    Include in My Healthcare Myself    Treatment Plan Discussion & Agreement Patient    Patient/Family Questions Patient unable to ask questions due to medical status      07/23/16 1400    Patient stated Goal    Patient stated goal to walk      07/23/16 1500    Subjective    Status since last treatment Pt demonstrated increased confusion today and increased perseveration. Sitter present at bedside.       07/23/16 1500    Inpatient Pain Assessment     Patient status Patient confused but agreeable    Patient premedicated prior to treatment with Oral pain medication    IP Pain  Numeric Pain Rating Scale      07/23/16 1500    Numeric Pain Rating Scale      Pain Intensity - rating at present 5    Pain Intensity - rating at worst  7    Pain Intensity- rating after treatment 7    Location  LLE    Pain Description unable to rate 2/2 decreased cog. states "I hurts a lot"      07/23/16 1500    Assessment    Assessment Pt continues to demonstrate increased confusion today unable to recall the name of his wife. Initially stated it correctly, but then later stated another name with a "R", and when confronted stated, "same difference." Pt increased emotionally lability crying at the side of the bed but stating "I have a long road ahead of me." Pt perseverative throughout session of a missing bag of his belongings and decreased safety during session. Pt needs 24 hour assist and max verbal cues due to decreased safety. Pt will benefit from ongoing OT services to increase cognition and safety with ADL tasks.     Potential to improve with therapy Good      07/23/16 1500    Treatment Provided Today    Payor Medicare/Private      07/23/16 1500    Therapeutic Procedures    Therapeutic Activities (97530) Dynamic activities to improve performance of functional tasks/activities;Patient education  Total TIMED Treatment (min) 45      07/23/16 1500    Treatment Time    Treatment Start Time 1200    Total TIMED Treatment (min) 45    Total Treatment Time (min) 45      07/23/16 1500    Focus for Next Treatment    Focus for Next Treatment ADL retraining;Cognitive activities;Balance activities      07/23/16 1500    Inpatient Patient Safety Considerations    Patient Call Light Within Reach Yes    Patient in Bed at the End of Treatment In Left sidelying position    Feet Positioned in Neutral and Heels Floating Using Appropriate Device Yes, nursing notified of proper position    The Patient May Be at Risk for Falls Yes, notified nursing   RN Charmaine DownsMarcus Pence aware    Family/Caregiver Present for Treatment None present      07/23/16 1500    Therapy Plan Communication     Therapy Plan Communication Discussed therapy plan with Nursing            OT Functional       07/23/16 1500    Activities of Daily Living (ADLs)    Patient Participation in ADL Tasks Patient agreeable to participate in ADL tasks    Lower Extremity Dressing Dependent/total assistance ( >75% assistance)   poor intitiation to don socks supine; maxA lift BLE    Toilet Transfer Unable    Unable Due To cognition and poor balance      07/23/16 1500    Activity Tolerance    General Activity Tolerance Poor - tolerates 25-49% of treatment session      07/23/16 1500    Cognition Assessment    Cognition Restrictions Cognitive deficits present    Cognition Comments At end of session, having a fake conversation on his hand (he thought it was a phone); oriented to self only, not to place, confused about name of his wife, emotionally labile, inconsistent command following with max cues for sequencing      07/23/16 1500    Communication Assessment    Communication Restrictions No communication limitations or impairments noted. Current status of hearing, speech and vision allow functional communication.      07/23/16 1500    Mobility for Daily Activities    Patient Participation in Mobilization Activities Patient agreeable to participate in mobility/or out of bed activities    Functional Mobility Detailed Comments Pt modA rolling for initiation and reaching across, modA supine to sit assist at trunk and BLE; tolerated sitting side of bed for 5 min fair balance; modA x2 sit to stand and maxA x2 to attempt to side step [facility] with totalA for FWW management and progression of RLE toward HOB. Pt maxA sit to supine and reposition.

## 2016-07-23 NOTE — Plan of Care (Signed)
Problem: Promotion of health and safety  Goal: Promotion of Health and Safety  The patient remains safe, receives appropriate treatment and achieves optimal outcomes (physically, psychosocially, and spiritually) within the limitations of the disease process by discharge.   Outcome: Progressing toward goal, anticipate improvement over: >48 hours   07/23/16 0604 07/23/16 1828   Adult/Peds Plan of Care   Standard of Care/Policy --  Critical Care SOC;[facility];Restraints SOC   Outcome Evaluation (rationale for progressing/not progessing No changes in neuro status. Pt remains confused, hallucinating most of the time, very restless, and agitated. Ativan 4mg  given x3 per CIWA. Good doppler pulses on BLEs.  --    Patient Specific Goal, Promotion of Health & Safety Continue with neuro check and do frequent re-orientation --    Additional Individualized Interventions/Recommendations  Q4h doppler check on BLE --    Nursing Shift Summary    No data found.    No data found.      Rounding for the past 12 hrs:   Physician Rounding   07/23/16 1010 My nursing colleague or I joined the physician during  his/her interaction/rounds with the patient at least once during my shift       Shift Comments for the past 12 hrs:   Comments   07/23/16 1010 Dr. Coralie CommonBrian Sheehan at bedside rounding.  I asked him for restraint orders- Posey Vest, upper and lower restraints and mittens.  He agreed and said he would put in an order and it is ok for restraints placed now.          Problem: Skin Integrity Impaired: Secondary to pressure ulcer(admission or hospital acquired), cellulitis, incontinence, surgical wound/incision, chronic wound or  skin disease  Goal: Absence of infection signs and symptoms  Change when soiled, integrity impaired, physican order, or, no longer than 7 days.   Outcome: Progressing toward goal, anticipate improvement over: >48 hours

## 2016-07-23 NOTE — Interdisciplinary (Signed)
NUTRITION NOTE      Patient Name:  Jerome Hunt  MRN: [id number]  Date of Birth: [birth year]  Age: 40 year old  Date of Admission:  07/06/2016  Service Date: July 23, 2016  Evaluation Type: Progress  Source of Referral: Routine F/U   Recommendations to Physicians: Continue current nutrition regimen (per SLP rec)  Additional Recommendations to Physician:     Jerome Hunt is a 40 year old male, admitted on 120117 with a diagnosis of has Peripheral artery disease (CMS-HCC); Peripheral vascular disease (CMS-HCC); Clostridium difficile colitis; Alcoholism /alcohol abuse (CMS-HCC); and Alcohol withdrawal delirium, acute, hyperactive (CMS-HCC) on his problem list..  Per vascular note 3-77:  49 year oldmale admitted with 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 Patient underwent attempted Popliteal-posterior tibial artery bypass on 12/1 with resulting graft failure and reoperation on 12/2 and underwent Popliteal to peroneal bypass with jump graft from peroneal artery to posterior tibial artery - this graft has subsequently gone down as well.   Postoperatively he developed:  #acute alcohol withdrawal - due to his undisclosed alcohol abuse. Due to his clinical decline and the need for further imaging studies he was intubated and sedated which has been controlling his agitation fairly well.   #C. Diff colitis  Subjective:  remains confused  Sleeping on exam,  incomprehensible sounds  Is able to indicate pain is currently controlled  Subjective: Pt awake, appears comfortable. Per RN pt is confused, not eating much.  Assessment: Pt extubated 12-14. Eval by SLP with current diet recs.  Nutrition Related Findings  Nutrition Subjective : Spoke with nursing  Physical Findings:   Current Diet order: Dysphagia 3 thin liquid  Nutrition Intake: 0-25%  Bowel Function: LBM today  Lab Review:   Medication Review:  I/O:    Intake/Output Summary (Last 24 hours) at 07/23/16 0804  Last data filed at 07/23/16 0700   Gross per 24 hour   Intake            728.9 ml   Output             3610 ml   Net          -2881.1 ml     Lab Review:  (Reviewed)  Medication Review:  (Reviewed)  dextrose-sodium chloride 5%-0.45%    heparin (porcine)  Allergies: No known allergies [other]    Pertinent Labs  BMP  Lab Results   Component Value Date    SODIUM 141 07/23/2016    K 3.5 07/23/2016    CL 107 07/23/2016    BUN 7 07/23/2016    CREAT 0.7 07/23/2016    GLU 94 07/23/2016    [place] 8.3 (L) 07/23/2016     Lab Results   Component Value Date    MG 1.9 07/23/2016    PHOS 3.7 07/23/2016     Nutrition Labs  Lab Results   Component Value Date    ALB 2.6 07/14/2016     No results found for: PREALB  No results found for: CRP  Lipid Panel  Lab Results   Component Value Date    CHOL 237 05/05/2016    LDLCALC 152 05/05/2016    TRIG 139 05/05/2016     A1C  No results found for: A1C    Anthropometrics  )  Ht Readings from Last 1 Encounters:   07/06/16 6' 3"  (1.905 m)        Wt Readings from Last 1  Encounters:   07/23/16 97.5 kg (214 lb 15.2 oz)      Body mass index is 26.87 kg/(m^2).  Nutrition Diagnosis   Nutrition Diagnosis 1: Increased Nutrient Needs  Related To: Increased nutrient needs due to catabolic illness  As Evidenced By: Conditions associated with medical Dx/Tx;Diarrhea;Binge eating  Status: Ongoing    Estimated Needs  Min Total Caloric Estimated Needs (kcals/day): 2437.5  Max Total Caloric Estimated Needs (kcals/day): 2925  Min Total Protein Estimated Needs (g/day): 117  Max Total Protein Estimated Needs (g/day): 146.25  Monitoring and Evaluation  Nutrition Goals  Meet >75% of estimated needs: Progressing  Nutritional needs met via alternative route: No Longer Applicable  Name: Harley Hallmark, RD     Date: 07/23/2016    Time: 8:04 AM

## 2016-07-23 NOTE — Interdisciplinary (Signed)
Speech Therapy Daily Treatment Note    Start of Care: 07/23/16  Onset date : 07/20/16  Reason for referral: Cognitive Impairment;Swallow Impairment     Preferred Language:English    Subjective   Patient seen while resting in bed. Restraints applied and 1:1 at bedside. Cleared for treatment by RN>   Contact Time: 1005  Treatment type : Dysphagia;Cognitive  Subjective : Agreeable to treatment; remains confused.      Objective     Patient seen for speech and swallowing treatment. Patient alert and oriented to self only and place as hospital. [place] and able to recall some personal information with 40% accuracy. Patient perseverating on need to sort through files and difficult to redirect. Some neologisms and word retrieval deficits noted. Patient attended to task for less than 5 minutes.    Patient is currently on a dys III diet with thin liquids. Trialed on regular solids with no evidence of aspiration or penetration. Patient consumed dry cracker and 5 oz of thin liquids via straw. Good laryngeal elevation, timely swallow reflex and clear vocal quality post swallow. Recommend diet advancement to regular with thin liquids.          Assessment              Improvement noted with swallow function. Patient is now safe for a regular diet with thin fluids. Min improvement noted with cognitive linguistic function. Ongoing treatment is warranted to ensure tolerance of diet upgrade and maximize cognitive linguistic skills.     Plan/Recommendations  Treatment 5x weekly  Diet: regular with thin liquids  Discussion and Agreement: Patient  Agreement to Plan of Care: Patient unable to demonstrate understanding of treatment plan  Aspiration Precautions : Sit upright while eating and drinking;Patient must be fed;Monitor for signs of aspiration;Slow rate of presentation;Small sips/bites;Alternate between foods and liquids;Maintain upright posture after eating for 30 minutes   Additional Recommendations : Dietary consult;Other (Comment) (to determine supplementary nutrition as indicated)  Interdisciplinary Communication : Discussed with RN - Marcus;Paged to Physician  Patient remained in bed with 1:1 at bedside at completion of treatment session.   Treatment time: 25 minutes  Andrey Cotaana Kronick Nylene Inlow, SLP

## 2016-07-23 NOTE — Interdisciplinary (Signed)
Social Work Assessment, Adult        Patient Name:  Jerome QuarryBrandon Gargis   MRN: [id number]   Date of Birth: [birth year]    Age: 40 year old   Date of Admission:  07/06/2016         Service Date: July 23, 2016     Assessment  Assessment Type: Progress/Follow-up   Referral Information  Referral Type: Other (Comment) (Pt's wife requests help with DI paperwork )     CSW for pt follow up support and resource needs.     CSW responding to SW consult re: pt's wife requesting help with DI paperwork.     CSW attempted to meet with wife at bedside, pt currently resting with sitter at bedside. CSW conferred with RN Berna SpareMarcus and reports wife left bedside about one hour ago.     CSW placed call to wife, Fleet ContrasRachel, introduced self/role and reason for phone call. Wife receptive to CSW and inquiring of disability application. Per wife, she submitted application last Sunday and pt was scheduled for phone interview, however, pt is unable to participate at this time. CSW encouraged wife to follow up with EDD case worker and/or office re: pts current medical status and to provide appropriate documentation  validating his admission. Wife receptive to recommendations.     Wife inquiring of home care options for pt. CSW explored In Home Supportive Resources with wife and briefly explained process. Wife receptive to this and requested for brochure to be left at bedside as she will be visiting pt later this evening or tomorrow AM. CSW acknowledged this and left [facility] brochure at bedside. No other indicated needs at this time.     CSW to remain available should needs arise. Duwaine MaxinSusana Hanif Radin, MSW p [phone]                                                                                                 Discharge Information:                Duwaine MaxinSusana Michaell Grider, MSW     Date: 07/23/2016    Time: 3:58 PM

## 2016-07-23 NOTE — Progress Notes (Signed)
Progress Note  Surgical Intensive Care Unit    Patient Name: Jerome Hunt  MRN: [id number]     Admitting Service: [place] Attending Provider: Rae Halsted    Mechanism of Injury: <principal problem not specified>    Date: 07/23/16  Hospital Day:   17 days - Admitted on: 07/06/2016    Injuries/Problems: Problem List:  2017-12: Peripheral vascular disease (CMS-HCC)  2017-11: Peripheral artery disease (CMS-HCC)  Clostridium difficile colitis  Alcoholism /alcohol abuse (CMS-HCC)  Alcohol withdrawal delirium, acute, hyperactive (CMS-HCC)    Overnight: Intermittent agitation, confusion, hallucinations overnight. Ativan 57m x 3 given. Other wise no acute events, pain well controlled; dopplerable PT/DP pulses bilaterally this AM. Diarrhea somewhat improving per nursing staff.    OBJECTIVE:     Vitals signs:     Latest Entry  Range (last 24 hours)    Temperature: 99 F (37.2 C)  Temp  Avg: 99.3 F (37.4 C)  Min: 98.1 F (36.7 C)  Max: 100.6 F (38.1 C)    Blood pressure (BP): (!) 116/93  BP  Min: 104/56  Max: 146/70    Heart Rate: 105  Pulse  Avg: 109.3  Min: 97  Max: 122    Respirations: 28  Resp  Avg: 26.1  Min: 19  Max: 33    SpO2: 98 %  SpO2  Avg: 98 %  Min: 90 %  Max: 100 %       No Data Recorded       No Data Recorded       No Data Recorded     Weight: 97.5 kg (214 lb 15.2 oz)  Percentage Weight Change (%): -2.21 %    I&O:  Intake/Output       07/22/16 0600 - 07/23/16 0559 07/23/16 0600 - 07/24/16 0559      [id number] Total 0600-1759 10347-4259Total       Intake    P.O.  50  -- 50  --  -- --    I.V.  407.1  463.4 870.5  30.5  -- 30.5    Total Intake 457.1 463.4 920.5 30.5 -- 30.5       Output    Urine  1900  1735 3635  60  -- 60    Stool  --  150 150  100  -- 100    Total Output 1900 1885 3785 160 -- 160       Net I/O     -1442.9 -1421.6 -2864.5 -129.5 -- -129.5            Labs:  CBC  Recent Labs      07/22/16   0520  07/23/16   0401   WBCCOUNT  21.7*  22.3*   HGB  7.7*  7.8*   PLCTEL  660*  716*   LYMPP2   9.5  14.0   MONP2  2.9  3.7          Chemistry  Recent Labs      07/22/16   0520  07/23/16   0401   SODIUM  143  141   K  3.3*  3.5   CL  107  107   CO2  28  27   BUN  7  7   CREAT  0.7  0.7   GLU  110  94   CA  8.6  8.3*   MG  1.9  1.9   PHOS  3.4  3.7     No results for input(s): ALK,  AST, ALT, TBILI, DBILI, ALB in the last 72 hours.    Coags  Recent Labs      07/22/16   2152  07/23/16   0401   PTT  80.4*  77.2*       Medications:  Scheduled Meds  . acetaminophen  650 mg Q6H   . aspirin  81 mg Daily   . folic acid  1 mg Daily   . influenza vaccine >=3 yrs  0.5 mL Prior to discharge   . metoprolol tartrate  25 mg Q12H   . metronidazole (FLAGYL) IVPB  500 mg Q8H NR   . povidone-iodine  1 Application BID   . QUEtiapine  150 mg Q12H   . thiamine  100 mg Daily   . vancomycin 500 mg in NS 100 mL rectal enema  500 mg Q6H NR   . vancomycin  500 mg Q6H NR     PRN Meds  . HYDROmorphone  0.3 mg Q2H PRN   . LORazepam  2 mg Q1H PRN    Or   . LORazepam  4 mg Q1H PRN   . metoprolol  5 mg Q4H PRN   . ondansetron  4 mg Q6H PRN   . oxyCODONE  5 mg Q6H PRN   . sodium chloride (PF)  10 mL PRN   . sodium chloride (PF)  20 mL PRN     IV Meds  . dextrose-sodium chloride 5%-0.45% 10 mL/hr at 07/22/16 1800   . heparin (porcine) 2,050 Units/hr (07/23/16 0551)       Allergies:  Allergies   Allergen Reactions   . No Known Allergies [Other] Other     Received name: No Known Allergies       ASSESSMENT / PLAN / RECOMMENDATIONS:  21M with a traumatic left lower extremity injury 3 years prior to presents with pain to left lower extremity concerning for claudication.  S/p left popliteal to distal PT bypass  S/p Bypass revision, left popliteal to peroneal and peroneal to PT jump graft  C.diff colitis, acute respiratory failure, sepsis, DT    07/06/16  LEFT POPLITEAL TO DISTAL POSTERIOR TIBIAL ARTERY BYPASS LEFT SAPHENOUS VEIN HARVEST / LEFT POPLITEAL ARTERY ANGIOPLASTY/LEFT DISTAL POSTERIOR TIBIAL ARTERY ANGIOPLASTY       07/07/16  Redo Left  transinterosseous below-knee popliteal-peroneal and sequential peroneal-distal posterior tibal-medial tarsal reverse saphenous vein bypass; Left distal segmental fibula resection to expose distal peroneal artery,  Thrombectomy of left below-knee popliteal-posterior tibial reverse saphenous vein bypass    Neuro:   GCS 14  -Head CT 12/7 Negative  Delirium tremens. Previously on precedex gtt/now:    -librium /schd/DC 12/16    - Ativan /schd/ DC 12/16    -seroquel 129m q8hrs    - ativan/prn: required 3x overnight      -Thiamine & folate daily   Pain: oxycodone PRN      CV:BP  Min: 104/56  Max: 146/70  Pulse  Avg: 109.3  Min: 97  Max: 122  100s-140s/50s-70s  HR 90s-120s  - metop 265mBID    #LLE claudication s/p bypass   - arterial duplex 07/08/16: There is distal arterial flow noted in the posterior tibial artery. The bypass graft is not visualized.Mild stenosis at the left mid-distal SFA.  - ABI with AT on LLE is 0.63 07/08/16   -DVT Bilt LE + UE [place] 07/12/16  -CTA runoff 07/12/16 shows Nonopacified left popliteal to distal posterior tibial artery bypass, suggesting graft failure..  - q4  neurovscular checks  - on heparin gtt, 12/18 0400 PTT:77.2, goal 65-90    Pulmonary:  Respiratory failure resolved and extubated on 12/14  RA sating upper 90s to 100%  CT chest 07/12/16 shows no findings  CXR today without focal infiltrate               FEN/GI:  KUB 12/7 shows worsening colonic illeus. CT abdomen 07/12/16 shows pan colitis.  Fluids TKO on 12/16 since tolerated diet  Protonix DCed since tolerated diet   Replete electrolytes  ABD XRAY 12/15: Nonspecific bowel gas pattern with persistent disproportionate gaseous distention of the transverse colon suggestive of focal ileus.  Per speech, patient can be advanced from dysphagia to regular diet with thin liquids.     Diet:Diet Order Regular; Liquid Consistency: Thin     Renal:   -Previously on rhabdo protocol for rising CK now resolved  Intake/Output Summary (Last 24  hours) at 07/20/16 5188  Last data filed at 07/20/16 0600   Gross per 24 hour   Intake          1065 ml   Output             4025 ml   Net          -296m     - Foley catheter exchanged for condom cath  - Creatinine stable and within normal limits   - Stool output decreasing       Hematology: received 1 u PRBC   hb 10.6 -> 8.9 ->8.0 -> 7.0 -> 9.3 ->8.3 ->7.8 ->7.7 ->7.3->6.7 -> 1u ->8.0 ->8.4 ->8.0 -> 8.4 -> 7.6 ->7.3->7.0->7.7-> 7.8   received 1 uPRBCs 12/5, 12/10      ID: Temp  Avg: 99.3 F (37.4 C)  Min: 98.1 F (36.7 C)  Max: 100.6 F (38.1 C)  afebrile WBC 17.6 -> 13.3 -> 18.8 -> 23.1 -> 26 ->22 ->23 ->21.9 -> 19.2 -> 23->21 ->22.3 ; with multiple foul loose stool; c. Diff colitis  Micro: BCx 12/1 NGTD, BC 12/5 NGTD  -UA 12/5 and 12/7 negative   -C.diff positive:   D/C'd vanco enema,   Continue oral vanco, iv flagyl since 12/7 ((Plan finish course of PO vanco/IV flagyl x14d ))  - ID recs  - continue PO Vancomycin 5012mq6 hours, and IV Flagyl 50048m8hours   - can dc the Vancomycin enema as there is no evidence of ileus  - continue supportive measures (fluids, replacement of electrolytes)  - daily abdominal exam/KUB to monitor   - avoid addition of broad-spectrum antibiotics as this may impede improvement of CDI    -BCx 12/13: NGTD  -sputum Cx 12/13: 3+ Haemophilus influenza  -UA 12/13: neg   -CXR today without focal consolidation     Endocrine:  FSBS: < 180    MSK:  PT/OT: yes  Weight Bearing Status:  weight bear as tolerated. ABI 0.6. Xray shows right knee effusion      ICU Care:  - PICC line 12/7  Feedings: yes  Analgesia: Yes  Sedation: no  Thromboprophylaxis:SCDs, early mobility protocol and subcutaneous heparin  Head-of-bed elevation: Yes  Ulcer prophylaxis: no  Glycemic control: no  GI Bowel care: Yes  Indwelling catheter removal: condom cath, remove after restraints removed  ID: de-escalation of Abx: yes  Spontaneous breathing trial: N/A    Family discussion: yes    DISPO: SICU/ possible downgrade  per vascular team      Surgeon; rounded on pt c Sicu team  Claudication  p LLE traumatic inj and bypass s/p series of op to restore distal flow.  etoh w/d  No events. GCS 14. Thiamine and folate. Change to po and d/c banana bag. Off librium. Now ativan prn. A&o x 3 c intermittent confusion  seroqu 150 mg bid. Pain controlled.   bp wnl. Heparin gtt per primary  Room air sio2 98%.   Dysphagia 3 diet.   Oral vanco and iv flagyl cont for c diff. 10 day therapy afeb wbc still elevated trend.    Lytes wnl. aki improved 0.7.   Hg 1 unit 7.8 now.   Glu < 180.   msk st bear as tol. Pt/ot order.cont condom cath.

## 2016-07-23 NOTE — Interdisciplinary (Signed)
Physical Therapy Daily Follow up Note          Post Acute Discharge Considerations  Anticipated required level of cognitive assistance: All of the time  Anticipated required level of safety assistance: Constant supervision  Anticipated required level of physical assistance: All of the time;One person assist  Anticipated physical barriers impacting discharge: Stairs/steps  Anticipated  level of therapy tolerance and ongoing therapy needs: Would benefit from intermittent Physical Therapy intervention post acute discharge to maximize functional independence  Equipment recommendations : To be determined as patient progresses with therapy    Admit Date: 07/06/2016         Preferred Language:English      Patient stated goal: to walk        PT Progress Report       07/23/16 1400    Assessment     Assessment  pt continues to make daily gains to improve OOB tolerance. Able to stand w/ FWW and modA, but having difficulty advancing RLE 2/2 pain in LLE and weakness in BUEs. Pt remains unsafe to tolerate OOB to chair at this time. Pt will continue to benefit from ongoing skilled PT to further improve current deficits and return to PLOF. PT will continue to follow.     Rehab Potential Good      07/23/16 1400    Inpatient Treatment Plan    Frequency of treatment 5 times per week    Status of treatment Patient evaluated and will benefit from ongoing skilled therapy      07/23/16 1400    Treatment Plan Discussion    Treatment Plan Discussion & Agreement Patient    Patient/Family Questions No questions      07/23/16 1400    Patient/Family Education    Learner(s) Patient    Patient/family training in appropriate therapeutic interventions No evidence of learning;Does not demonstrate readiness to learn    Education Topic(s) Mobility;Exercise Program      07/23/16 1400    Subjective    Status since last treatment  pt received supine in bed and agreeable to  treatment. Pt confused about date, time and place. Also forgetting wife's name during treatment. Again following treatment today, pt put his hand up to his ear as if calling on the telephone and having a conversation with his mother. Sitter present throughout treatment.       07/23/16 1400    Numeric Pain Rating Scale     Pain Intensity - rating at present 4    Pain Intensity - rating at worst  7    Pain Intensity- rating after treatment 7    Frequency  Intermittent    Description  Throbbing;Aching    Location  --   LLE      07/23/16 1400    Inpatient Pain Assessment    Patient status Patient confused but agreeable;Nursing in agreement for treatment    Patient premedicated prior to treatment with Oral medication      07/23/16 1400    Activity Restrictions    Activity Restrictions Other (comments)   WBAT LLE      07/23/16 1400    Inpatient Precautions/Contraindications    Precautions Fall;Isolation    Behavioral Restraints;Sitter required    Fall Socks/charm    Isolation Contact    Equipment/Lines Currently Present at Bedside Lines/drains/airways    Lines/Drains/Airways IV;Telemetry   flexi-seal, condom cath      07/23/16 1400    Inpatient Patient Safety Considerations    Patient call light  within reach Yes    Patient in bed at the end of treatment in Supine position    Feet positioned in neutral and heels floating using appropriate device. Yes, Nursing notified of proper position    The patient may be at risk for falls Yes- Notified Nursing   Berna Spare, RN    Family/caregiver present for treatment None Present      07/23/16 1400    Therapy Plan Communication    Therapy Plan Communication Discussed therapy plan with Nursing      07/23/16 1400    Treatment provided today    Payor  Medicare/Private      07/23/16 1400    Therapeutic Procedures    Therapeutic Activities (97530)  Functional activities       Total TIMED Treatment (min)  45      07/23/16 1400    Focus for Next Treatment     Focus for Next Treatment Gait training;Therapeutic activities;Therapeutic exercise      07/23/16 1400    Treatment Time     Treatment start time 1045    Total TIMED Treatment  (min) 45    Total Treatment Time (min) 45            PT Functional       07/23/16 1400    Functional Mobility - General    Bed Mobility Minimum assistance (25% assistance)    Transfers to/from Stand Moderate assistance (25-50% assistance)    General Functional Mobility Comments Pt unable to side step along EOB, but only able to wiggle foot to R w/ maxA to move FWW. Unable to take steps forward or back and unable to bear weight on LLE.       07/23/16 1400    Activity Tolerance    General Activity Tolerance Fair - tolerates 50%-74% of treatment session      07/23/16 1400    Functional Mobility - Detailed    Rolling Assistance level    Assistance Level Minimum assistance (25% assistance)    Supine to Sit Assistance level    Assistance Level Minimum assistance (25% assistance)    Sit to Supine Assistance level    Assistance Level Moderate assistance (25-50% assistance)   to lift BLE back into bed.     Sit to Stand Assistance level    Assistance Level Moderate assistance (25-50% assistance)    Stand to Sit Assistance level    Assistance Level Minimum assistance (25% assistance)      07/23/16 1400    Static Sitting Balance    Static Sitting Balance - Control Good - able to maintain balance without handhold support, limited postural sway      07/23/16 1400    Dynamic Sitting Balance    Dynamic Sitting Balance - Control Fair - accepts minimal challenge, able to maintain balance while turning head/trunk      07/23/16 1400    Static Standing Balance    Static Standing Balance - Control Poor - requires handhold support and moderate to maximal assistance to maintain position      07/23/16 1400    Dynamic Standing Balance    Dynamic Standing Balance - Control Poor - unable to accept challenge or move without loss of balance                 07/23/16 1400     Gait    Assistive Device Used Front wheeled walker  PT Discharge with Treatment Report       07/23/16 1400    Progress report date    Progress report date 07/23/16

## 2016-07-23 NOTE — Discharge Summary (Signed)
DISCHARGE SUMMARY    Patient Name:  Jerome Hunt    Date of Admission:  07/06/2016  Date of Discharge:  07/27/2016      Principal Diagnosis:    1. Left lower extremity peripheral vascular disease  2. Left lower extremity tibioperoneal occlusive disease      Hospital Problem List:Active Hospital Problems    Diagnosis    Clostridium difficile colitis [A04.72]    Alcoholism /alcohol abuse (CMS-HCC) [F10.20]    Alcohol withdrawal delirium, acute, hyperactive (CMS-HCC) [F10.231]    Peripheral vascular disease (CMS-HCC) [I73.9]      Resolved Hospital Problems    Diagnosis   No resolved problems to display.       Additional Hospital Diagnoses ("rule out" or "suspected" diagnoses, etc.):  1. Alcohol withdrawal  2. C.diff colitis    Principal Procedure During This Hospitalization:  07/06/16:  1. Left popliteal to distal posterior tibial artery bypass  2. Left saphenous vein harvest  3. Aortogram  4. Left lower extremity angiography  5. Left popliteal artery angioplasty (4 x 4, 5 x 4)  6. Left distal posterior tibial artery angioplasty 3 x 4, 2 x 4, wound class 1    07/07/16:  1. Aortoiliac and selective left lower extremity angiography via right common femoral artery access  2. Ultrasound-guided right common femoral artery needle entry and third order catheter placement x2  3. Thrombectomy of left below-knee popliteal-posterior tibial reverse saphenous vein bypass  4. Redo left trans-interosseous below-knee popliteal-peroneal and sequential peroneal-distal posterior tibial-medial tarsal reverse saphenous vein bypass  5. Distal segmental fibulectomy (approximately 4 cm) to expose distal peroneal artery  6. Distal posterior tibial/branch plantar artery endarterectomy    Other Procedures Performed During This Hospitalization:  1. PICC placement    Consultations Obtained During This Hospitalization:  1. Infectious Disease - Management of C.diff colitis  2. Acute Pain Service - Management of post-operative pain     Reason for Admission to the Hospital / History of Present Illness:  Jerome QuarryBrandon Munley is a 40 year old male with a traumatic left lower extremity injury 3 years ago who presented to clinic with pain to left lower extremity concerning for claudication. Patient is a prior smoker for 10 years, quit 3 months prior. Brief clinical history included pain for the past 2 years, worsened this past May, with trouble walking/exertion. Patient states that the pain is primarily in the sole of his foot, and worsens with walking less than a block, improves with rest. Foot also becomes quite numb. Patient also states that he gets cramps at night, however, they are not localized, and they occur on both feet, and sometimes improves with rest. He was admitted to [facility] for rest pain of LLE and had angiogram 03/22/2016, which showed tri-vessel disease with physical therapy and peroneal artery run off distally. A balloon angioplasty of his posterior tibial artery was attempted, but failed. [facility], but [facility] Patient was previously on cilostazol, switched to pentoxifylline a couple months prior due to lightheadedness on cilostazol. Patient states that he hasn't walked much. ABI on 05/24/16 demonstrated 0.7 on LLE and 1.1 on RLE.No DVT BL. Vein mapping performed on 06/05/16 showed patent veins in the LLE. Given persistent symptoms of claudication and failed balloon angioplasty, it was recommended for patient to undergo bypass surgery. After a detailed discussion regarding the risks, benefits and alternatives to surgery, patient has elected to proceed with surgical intervention and informed consent  was obtained in pre-operative clinic. Complications can include but are not limited to bleeding, infection, damage to the surrounding structures, obstruction, DVT/PE, need for reoperation, recurrence of condition, respiratory failure, renal,  neurologic and cardiac issues, as well as death and among others. He presented as a planned admission on 07/06/16 and underwent the above stated procedures. He was transferred to the SICU intubated due to acidosis, as well as for close neurovascular monitoring. On POD1, however, his bypass was not palpable, there was no doppler signal and his foot was cold. His wife was contacted and he was emergently taken back to the OR for revision of his bypass on 07/07/16. He was again taken back to the SICU for close monitoring. Incidentally during this time, it was discovered that the patient has been secretly drinking heavily at home, which would likely explain his agitation and difficulty with anesthesia during his first operation. He was subsequently placed on CIWA protocol. He was kept intubated and sedated for increased agitation secondary to acute alcohol withdrawal, but was extubated on 07/09/16.    During PM rounds on 07/11/16, patient was noted to have cool foot without palpable pulses. Pt had restraint on right ankle. This was removed and we attempted to assess pulses, which were not palpable nor could we initially appreciate a doppler signal on the PT or DP on the right. He had palpable femoral pulse on the right that was confirmed with doppler, no popliteal signal was present. The foot of the bed was removed as it appeared the patient was pressing his foot against the foot plate of the bed and locking his knee in extension. We were initially planning to intubate the patient and take him down for a Stat CTA as the patients agitation from alcohol withdrawal would be prohibitive.  During the time we were mobilizing resources to intubate and transport the patient to the CT scanner, the anterior tibial signal became present on doppler, and the decision was made to hold the CT scan and perform a lower extremity doppler study as we believed that he was likely in vasospasm likely from  the hyperextension of the knee or possibly compression from the restraint. Flow to the ankle was noted however, there was no pulsatile arterial flow in the dorsalis pedis or posterior tibial vessels in the foot. During PM rounds on 07/12/16, the patient was noted to be in more distress. He was hypotensive with blood pressure in the 90s as well as worsening tachycardia. On exam, his abdomen was taught and more distended. A review of patient labs showed elevated WBC to 18 with worsening lactic acidosis and the patient had several loose bowel movements. Additionally, the CK was elevated. He had bialteral AT signals in both extremities. Given findings, we were concerned the patient may have developed antibiotic associated colitis and had become septic from the infection.  Given his altered mental status from his alcohol withdrawal, we felt the safest course of action would be to intubate and sedate the patient. We would then proceed with CT scan of the head, abdomen and lower extremity runoff as he has been unable to safely travel to the CT scanner for the past several days due to agitation. The patient was intubated by the ICU team and started on empiric treatment for Cdiff colitis. KUB showed colonic colitis. CT abdomen showed pan colitis. C.diff toxin subsequently came back positive. ID was consulted and assisted with antibiotic management.     Patient continued to progress and was successfully extubated on  12/14. His stool output continued to decrease and rectal tube removed on 12/18. His mentation improved and agitation resolved so CIWA protocol was discontinued on 12/20, and he was transferred out of the SICU on the same day. He began to work with PT/OT on a regular basis to regain his strength and function. His PO intake increased and he was medically stable for discharge. On the day of discharge (HOD21), patient had stable vitals and labs, tolerating diet without nausea or vomiting, pain controlled on oral  medications, voiding spontaneously without difficulty, out of bed, ambulating, and ready to be discharged. Patient will follow-up in our outpatient clinic.    Discharge Instructions:   ?   Medications:   ?   - Please take all of your medications as prescribed to you.  - If you're taking narcotics for pain, please do not drive or operate machinery while on these medications.   - If you're taking Tylenol, please do not take Norco or Percocet at the same time as it may lead to liver damage. Only take one or the other, NOT both.   ?   Wound care:   - You may shower daily. Do not tub bathe. No swimming.   - Allow soapy water to run over the incisions. Do not scrub   - Pat dry with a towel, do not rub  - We will evaluate your wound at your next clinic visit appointment and will let you know when it would be appropriate to remove your sutures.   ?   Diet:   - Please follow your previously instructed post operative diet plan   - Drink plenty of fluids   - If you experience constipation, please take a stool softener or laxative to help aid in your bowel movements   ?   Activity:   - Avoid lifting more than 10 to 15 pounds until seen in your follow up appointment   - No driving while taking narcotic pain medication   - Please walk as often as you are able to - at least approximately 30 minutes a day.   - Do the coughing and breathing exercises you were taught in the hospital. If you were given an incentive spirometer to help with breathing, use it as directed. This is important and will help prevent lung infections.   - Ask your healthcare provider when you can return to work at your follow up appointment   ?   Follow up:   - Please attend your post operative follow up appointment if it has not been already scheduled.   - If it has not been scheduled yet, please call the clinic and schedule a post operative follow up appointment in 1 to 2 weeks.   ?   Please call the hospital or clinic and ask for the Vascular surgeon on  call OR go to the emergency room if you experience:   ?   - Fevers greater than 100.30F or chills   - Increased pain, redness, swelling, bleeding, or foul-smelling drainage at the incision site   - Incision separates or comes apart   - Increasing nausea and/or vomiting   - Worsening abdominal pain or isnt relieved by pain medicine   - Inability to have a bowel movement or pass gas in 3 days   - Dark-colored or bloody urine   - Bright red or dark black stools   - New onset of chest pain or shortness of breath  Tests Outstanding at Discharge Requiring Follow Up:  None    Discharge Condition:  Stable    Key Physical Exam Findings at Discharge:  No significant physical examination findings at the time of discharge    Discharge Diet:  Diet Order Therapeutic; Dysphagia; Dysphagia 3 Advanced (chopped); Liquid Consistency: Thin    Immunization History:  There is no immunization history for the selected administration types on file for this patient.    Discharge Medications:     What To Do With Your Medications      START taking these medications       Add'l Info    apixaban 5 MG Tabs   Commonly known as:  ELIQUIS   Take 1 tablet (5 mg) by mouth every 12 hours.    Quantity:  50 tablet   Refills:  0       metoprolol tartrate 25 MG tablet   Commonly known as:  LOPRESSOR   Take 1 tablet (25 mg) by mouth every 12 hours.    Quantity:  60 tablet   Refills:  0       oxyCODONE 15 MG immediate release tablet   Commonly known as:  ROXICODONE   Take 1 tablet (15 mg) by mouth every 6 hours as needed for Pain.    Quantity:  30 tablet   Refills:  0       traMADol 50 MG tablet   Commonly known as:  ULTRAM   Take 1 tablet (50 mg) by mouth every 4 hours as needed ([facility]).    Quantity:  30 tablet   Refills:  0       vancomycin 50 MG/ML Soln   Commonly known as:  VANCOCIN   Take 2.5 mLs (125 mg) by mouth every 6 hours for 12 doses Indications: Infectious Diarrhea.    Quantity:  40 mL   Refills:  0          CHANGE how you take these medications       Add'l Info    ASPIRIN 81 PO   Take 81 mg by mouth daily.    Refills:  0   What changed:  Another medication with the same name was removed. Continue taking this medication, and follow the directions you see here.       gabapentin 300 MG capsule   Commonly known as:  NEURONTIN   Take 1 capsule (300 mg) by mouth every 8 hours.    Quantity:  50 capsule   Refills:  0   What changed:  when to take this         CONTINUE taking these medications       Add'l Info    amitriptyline 25 MG tablet   Commonly known as:  ELAVIL   Take 25 mg by mouth daily.    Refills:  4       pentoxifylline 400 MG Controlled-Release tablet   Commonly known as:  TRENTAL   Take 400 mg by mouth 3 times daily.    Refills:  0         STOP taking these medications          HYDROcodone-acetaminophen 5-325 MG tablet   Commonly known as:  NORCO       HYDROcodone-acetaminophen 7.5-325 MG tablet   Commonly known as:  NORCO       oxyCODONE-acetaminophen 5-325 MG tablet   Commonly known as:  PERCOCET  Where to Get Your Medications      These medications [facility] to [facility] [address] - [place], [place] - [phone] [facility]  [address], [place] [phone]    Hours:  Mon-Fri 9am-9pm, Sat Otho Bellows9am-7pm, Sun 10am-6pm Phone:  [phone]     gabapentin 300 MG capsule    metoprolol tartrate 25 MG tablet         Please check with staff for printed prescription or if prescription was faxed to your pharmacy.     Bring a paper prescription for each of these medications     apixaban 5 MG Tabs    oxyCODONE 15 MG immediate release tablet    traMADol 50 MG tablet    vancomycin 50 MG/ML Soln             Allergies:  Allergies   Allergen Reactions    No Known Allergies [Other] Other     Received name: No Known Allergies       MDRO Status: Negative    Discharge Disposition:  Home    Discharge Code Status:  Full code / full care  This code status is not changed from the time of admission.     Follow Up Appointments:  Scheduled appointments:  Future Appointments  Date Time Provider Department Center   08/15/2016 11:15 AM Dalene CarrowKabutey, Nii-Kabu, MD [place] P3VASSUR [place]-PAV3   08/27/2016 1:00 PM [place] [facility] [place] P1PM [place]-PAV1   09/10/2016 3:00 PM [place] [facility] [place] P1PM [place]-PAV1       For appointments requested for after discharge that have not yet been scheduled, refer to the Post Discharge Referrals section of the After Visit Summary.    Discharging Physician's Contact Information:  Discharging Physician's Contact Information: [facility] at [phone]      Maryln GottronNgoc Canton Yearby, MD

## 2016-07-23 NOTE — Plan of Care (Signed)
Problem: Promotion of health and safety  Goal: Promotion of Health and Safety  The patient remains safe, receives appropriate treatment and achieves optimal outcomes (physically, psychosocially, and spiritually) within the limitations of the disease process by discharge.   Outcome: Progressing toward goal, anticipate improvement over: >48 hours   07/23/16 0604   Adult/Peds Plan of Care   Standard of Care/Policy Critical Care SOC;[facility];Falls reduction   Outcome Evaluation (rationale for progressing/not progessing No changes in neuro status. Pt remains confused, hallucinating most of the time, very restless, and agitated. Ativan 4mg  given x3 per CIWA. Good doppler pulses on BLEs.    Patient Specific Goal, Promotion of Health & Safety Continue with neuro check and do frequent re-orientation   Additional Individualized Interventions/Recommendations  Q4h doppler check on BLE     Nursing Shift Summary      Rounding for the past 12 hrs:   Physician Rounding   07/23/16 0600 My nursing colleague or I joined the physician during  his/her interaction/rounds with the patient at least once during my shift

## 2016-07-23 NOTE — Progress Notes (Signed)
[facility] NOTE       Current Hospital Stay:   17 days - Admitted on: 07/06/2016    47M with a traumatic LLE injury 3 y.a. presented with pain to LLE concerning for claudication.  S/p left popliteal to distal PT bypass  S/p bypass revision, left popliteal to peroneal and peroneal to PT jump graft  C.diff colitis, acute respiratory failure, sepsis, DT    07/06/16  L pop to distal PTA bypass  L saphenous vein harvest  L pop a. angioplasty  L distal PTA angioplasty     07/07/16  Redo L transinterosseous below-knee popliteal-peroneal and sequential peroneal-distal posterior tibal-medial tarsal reverse saphenous vein bypass   L distal segmental fibula resection to expose distal peroneal artery,  Thrombectomy of L below-knee popliteal-posterior tibial reverse saphenous vein bypass    Subjective:  Remains confused and can get agitated at times req. soft restraint. Responsive during exam. Rambles. Appears to be talking to himself.  Per RN report, has not been sleeping much. Still hallucinates. CIWA score ranges 16-24. Ativan x8 in last 24 hrs, x3 during PM shift.  Pain controlled. Tolerating diet although minimal.      Objective:      Current Medications:   acetaminophen  650 mg Q6H    aspirin  81 mg Daily    folic acid  1 mg Daily    influenza vaccine >=3 yrs  0.5 mL Prior to discharge    metoprolol tartrate  25 mg Q12H    metronidazole (FLAGYL) IVPB  500 mg Q8H NR    povidone-iodine  1 Application BID    QUEtiapine  150 mg Q12H    thiamine  100 mg Daily    vancomycin 500 mg in NS 100 mL rectal enema  500 mg Q6H NR    vancomycin  500 mg Q6H NR      dextrose-sodium chloride 5%-0.45% 10 mL/hr at 07/22/16 1800    heparin (porcine) 2,050 Units/hr (07/23/16 0551)      HYDROmorphone  0.3 mg Q2H PRN    LORazepam  2 mg Q1H PRN    Or    LORazepam  4 mg Q1H PRN    metoprolol  5 mg Q4H PRN    ondansetron  4 mg Q6H PRN    oxyCODONE  5 mg Q6H PRN    sodium chloride (PF)  10 mL PRN     sodium chloride (PF)  20 mL PRN       Review of Systems:   Nutrition: Diet Order Therapeutic; Dysphagia; Dysphagia 3 Advanced (chopped); Liquid Consistency: Thin   Gastrointestinal: Diarrhea  Mobility:  Progressive  Pain: Data Unavailable    Vital Signs:  Temperature:  [98.1 F (36.7 C)-100.6 F (38.1 C)] 99 F (37.2 C) (12/18 0400)  Blood pressure (BP): (104-146)/(56-102) 125/78 (12/18 0500)  Heart Rate:  [97-122] 109 (12/18 0500)  Respirations:  [19-33] 21 (12/18 0500)  Pain Score: NA (pre med, non-pain or scheduled) (12/18 0551)  O2 Device: None (Room air) (12/18 0500)  SpO2:  [90 %-100 %] 99 % (12/18 0500)  RASS Score: Restless    Wt Readings from Last 1 Encounters:   07/22/16 99.7 kg (219 lb 12.8 oz)       Intake/Output (Current Shift):  12/17 0600 - 12/18 0559  In: 920.5 [P.O.:50; I.V.:870.5]  Out: 3785 [Urine:3635]      Intake/Output Summary (Last 24 hours) at 07/23/16 0605  Last data filed at 07/23/16 0600   Gross per 24 hour  Intake           931.03 ml   Output             3645 ml   Net         -2713.97 ml     Stool output: 250cc    Physical Exam:  General Appearance: Well developed, well nourished, confused, rambling, comfortable  Heart: Sinus tachycardia.  Lungs: Breathing comfortably on RA.  Abdomen: Soft, decreased distention. Non-peritoneal. Rectal tube in place with brown liquid stool.  Extremities:   - RLE: dopplerable PT and DP signals present  - LLE: wound without erythema. Intact. Dressing in place. Dopplerable AT signal.     Diagnostic Data:  Laboratory data:   Lab Results   Component Value Date    K 3.5 07/23/2016    CL 107 07/23/2016    BUN 7 07/23/2016    CREAT 0.7 07/23/2016    GLU 94 07/23/2016    [place] 8.3 (L) 07/23/2016     Lab Results   Component Value Date    HGB 7.8 (L) 07/23/2016    HCT 23.3 (L) 07/23/2016     Lab Results   Component Value Date    HGB 7.8 (L) 07/23/2016    HCT 23.3 (L) 07/23/2016     No results found for: AST, ALT, ALK, TBILI, DBILI, TP, ALB  Lab Results    Component Value Date    PTT 77.2 (H) 07/23/2016     No results found for: ARTPH, ARTPO2, ARTPCO2  No results found for: PH, PO2, PCO2  No results found for: TSH, FREET4, T3  No results found for: CPK, CKMBH, TROPONIN  No results found for: CPK, CKMBH, TROPONIN  No results found for: PHUA, SGUA, GLUCOSEUA, KETONEUA, BLOODUA, [place], LEUKESTUA, NITRITEUA, [place], RBCUA, EPITHCELLSUA, CRYSTALSUA, [place] / PLAN:  4 year oldmale admitted with 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 Patient underwent attempted Popliteal-posterior tibial artery bypass on 12/1 with resulting graft failure and reoperation on 12/2 and underwent Popliteal to peroneal bypass with jump graft from peroneal artery to posterior tibial artery - this graft has subsequently gone down as well.     Postoperatively, he developed:    #Acute alcohol withdrawal - due to his undisclosed alcohol abuse. Due to his clinical decline and the need for further imaging studies he was intubated and sedated which has been controlling his agitation fairly well.     #C. Diff colitis      Neuro: PO and IV pain medications cont. CIWA, wean as able. Cog eval - CL 60%-79% impaired, limited or restricted  CV: Tachycardic due to infection and alcoholic withdrawal, improving, low 100s when calm, up to 130s with bouts of agitation  Pulm: H. Flu 3+ in sputum, but w/o focal consolidation. deferring tx for now. Consider if WBC continues to remain high.  GI: Dysphagia diet. Encourage PO intake. PO and PR VAnco, Flagyl for C. Diff  Renal: Normal creatinine. Replete lytes PRN. Will d/c Foley today and switch to condom cath.   Heme:Acute blood loss anemia - some element of dilutional component given no active signs of bleeding.Continue heparin gtt to decrease risk of LLE thrombosis  ID: C diff colitis - continue vancomycin and Flagyl, afeb. May need to  consider colonic lavage if acutely decompensates will need emergent colectomy. Stool output has been decreasing. Will remove rectal tube today.  Endocrine: ISS  Dispo:  SICU

## 2016-07-23 NOTE — Progress Notes (Signed)
Inpatient Heparin Drug Monitoring Note    Jerome Hunt is a 40 year old male on anticoagulation therapy for lower extremity ischemia.      Past Medical History:  Past Medical History:   Diagnosis Date    Alcohol withdrawal delirium, acute, hyperactive (CMS-HCC)     Alcoholism /alcohol abuse (CMS-HCC)     Clostridium difficile colitis        Allergies:  Allergies   Allergen Reactions    No Known Allergies [Other] Other     Received name: No Known Allergies       Medications:    Current Inpatient Medications:   acetaminophen  650 mg Q6H    aspirin  81 mg Daily    folic acid  1 mg Daily    influenza vaccine >=3 yrs  0.5 mL Prior to discharge    metoprolol tartrate  25 mg Q12H    metronidazole (FLAGYL) IVPB  500 mg Q8H NR    povidone-iodine  1 Application BID    QUEtiapine  150 mg Q12H    thiamine  100 mg Daily    vancomycin 500 mg in NS 100 mL rectal enema  500 mg Q6H NR    vancomycin  500 mg Q6H NR      dextrose-sodium chloride 5%-0.45% 10 mL/hr at 07/22/16 1800    heparin (porcine) 2,050 Units/hr (07/23/16 0551)       aPTT Goal:   65 - 90 seconds    Dosing weight = 91 kg     Current Heparin Rate:     Dose (Units/hr) Heparin: 2050 Units/hr    Labs:    Coags:  Recent Labs      07/20/16   2306  07/21/16   1125  07/21/16   1847  07/22/16   0520  07/22/16   1440  07/22/16   2152  07/23/16   0401   PTT  79.9*  53.7*  116.5*  44.8*  129.4*  80.4*  77.2*       Labs:  (Last 3 days):    Recent Labs      07/21/16   0408  07/22/16   0520  07/23/16   0401   HGB  7.4*  7.7*  7.8*   HCT  22.3*  23.6*  23.3*   CREAT  0.7  0.7  0.7       Drug/Disease Interactions and Assessments:    - Bleeding Assessment:  H/H is stable. Patient does not exhibit any signs or symptoms of bleeding (spoke to RN).     - Current Nutrition Assessment:   Recent changes in nutrition/diet? no  Diet Order Therapeutic; Dysphagia; Dysphagia 3 Advanced (chopped); Liquid Consistency: Thin    Assessment/Plan:   - aPTT= 77.2, within therapeutic goal range   - Will continue heparin maintenance infusion at 2050 units/hour   - Check aPTT every 12 hours, next PTT at 1600 this evening   - Continue to monitor for signs and symptoms of bleeding  - Patient does not have IM medications ordered. If given use small needle and apply pressure to injection site for 2 minutes.    Pharmacy will continue to monitor closely. For questions please call [phone]    Early CharsLaura Claire Katelynn Heidler, [facility]ARMD

## 2016-07-24 LAB — CBC WITH DIFF, BLOOD
Basophils %: 0.9 %
Basophils Absolute: 0.2 10*3/uL (ref 0.0–0.2)
Eosinophils %: 3.8 %
Eosinophils Absolute: 0.8 10*3/uL — ABNORMAL HIGH (ref 0.0–0.5)
Hematocrit: 26 % — ABNORMAL LOW (ref 39.5–50.0)
Hgb: 8.3 G/DL — ABNORMAL LOW (ref 13.5–16.9)
Lymphocytes %.: 12.3 %
Lymphocytes Absolute: 2.6 10*3/uL (ref 0.9–3.3)
MCH: 30.3 PG (ref 27.0–33.5)
MCHC: 31.8 G/DL — ABNORMAL LOW (ref 32.0–35.5)
MCV: 95.1 FL (ref 81.5–97.0)
MPV: 9.6 FL (ref 7.2–11.7)
Monocytes %: 2.8 %
Monocytes Absolute: 0.6 10*3/uL (ref 0.0–0.8)
Nucleated RBCs: 1 /100 WBC
PLT Count: 765 10*3/uL — ABNORMAL HIGH (ref 150–400)
Platelet Morphology: NORMAL
RBC: 2.74 10*6/uL — ABNORMAL LOW (ref 3.70–5.00)
RDW-CV: 15.5 % — ABNORMAL HIGH (ref 11.6–14.4)
Seg Neutro % (M): 80.2 %
Seg Neutro Abs (M): 16.7 10*3/uL — ABNORMAL HIGH (ref 2.0–7.5)
White Bld Cell Count: 20.8 10*3/uL — ABNORMAL HIGH (ref 4.0–10.5)

## 2016-07-24 LAB — BASIC METABOLIC PANEL, BLOOD
BUN: 7 mg/dL (ref 7–25)
CO2: 26 mmol/L (ref 21–31)
Calcium: 8.6 mg/dL (ref 8.6–10.3)
Chloride: 107 mmol/L (ref 98–107)
Creat: 0.8 mg/dL (ref 0.7–1.3)
Electrolyte Balance: 7 mmol/L (ref 2–12)
Glucose: 90 mg/dL (ref 70–115)
Potassium: 3.3 mmol/L — ABNORMAL LOW (ref 3.5–5.1)
Sodium: 140 mmol/L (ref 136–145)
eGFR - high estimate: >60 (ref >59)
eGFR - low estimate: >60 (ref >59)

## 2016-07-24 LAB — PHOSPHORUS, BLOOD: Phosphorus: 3 MG/DL (ref 2.5–5.0)

## 2016-07-24 LAB — MAGNESIUM, BLOOD: Magnesium: 2 mg/dL (ref 1.9–2.7)

## 2016-07-24 LAB — APTT, BLOOD: PTT: 76.7 s — ABNORMAL HIGH (ref 24.1–36.3)

## 2016-07-24 MED ORDER — POTASSIUM CHLORIDE 20 MEQ/15ML (10%) OR SOLN
20.00 meq | Freq: Once | ORAL | Status: AC
Start: 2016-07-24 — End: 2016-07-24
  Administered 2016-07-24 (×2): 20 meq via ORAL
  Filled 2016-07-24: qty 15

## 2016-07-24 MED ORDER — HYDROMORPHONE HCL 2 MG/ML IJ SOLN
0.4000 mg | INTRAMUSCULAR | Status: DC | PRN
Start: 2016-07-24 — End: 2016-07-26
  Administered 2016-07-24 – 2016-07-25 (×2): 0.4 mg via INTRAVENOUS
  Administered 2016-07-25: 2 mg via INTRAVENOUS
  Administered 2016-07-25 – 2016-07-26 (×7): 0.4 mg via INTRAVENOUS
  Administered 2016-07-26 (×2): 2 mg via INTRAVENOUS
  Filled 2016-07-24 (×11): qty 1

## 2016-07-24 MED ORDER — OXYCODONE HCL 5 MG OR TABS
10.0000 mg | ORAL_TABLET | Freq: Four times a day (QID) | ORAL | Status: DC | PRN
Start: 2016-07-24 — End: 2016-07-26
  Administered 2016-07-25 – 2016-07-26 (×5): 10 mg via ORAL
  Filled 2016-07-24 (×4): qty 2

## 2016-07-24 NOTE — Progress Notes (Signed)
[place] NOTE       Current Hospital Stay:   18 days - Admitted on: 07/06/2016    65M with a traumatic LLE injury 3 y.a. presented with pain to LLE concerning for claudication.  S/p left popliteal to distal PT bypass  S/p bypass revision, left popliteal to peroneal and peroneal to PT jump graft  C.diff colitis, acute respiratory failure, sepsis, DT    07/06/16:  L pop to distal PTA bypass  L saphenous vein harvest  L pop a. angioplasty  L distal PTA angioplasty     07/07/16:  Redo L transinterosseous below-knee popliteal-peroneal and sequential peroneal-distal posterior tibal-medial tarsal reverse saphenous vein bypass   L distal segmental fibula resection to expose distal peroneal artery,  Thrombectomy of L below-knee popliteal-posterior tibial reverse saphenous vein bypass    Subjective:  NAOE. More calm and behaves appropriately to situations. Follows commands. Disoriented to situation and month of the year. All restraints are off. Slept well throughout the night. CIWA 8-10. Ativan x1 at 8AM yesterday morning. Foley and rectal tube d/c. Condom cath placed. Pain controlled. Improving PO intake. AF. Tachy improved.      Objective:      Current Medications:   acetaminophen  650 mg Q6H    aspirin  81 mg Daily    folic acid  1 mg Daily    influenza vaccine >=3 yrs  0.5 mL Prior to discharge    metoprolol tartrate  25 mg Q12H    metronidazole (FLAGYL) IVPB  500 mg Q8H NR    potassium chloride  20 mEq Once    povidone-iodine  1 Application BID    QUEtiapine  150 mg Q12H    thiamine  100 mg Daily    vancomycin  500 mg Q6H NR      dextrose-sodium chloride 5%-0.45% 10 mL/hr at 07/23/16 0700    heparin (porcine) 2,050 Units/hr (07/24/16 0622)      HYDROmorphone  0.3 mg Q2H PRN    LORazepam  2 mg Q1H PRN    Or    LORazepam  4 mg Q1H PRN    metoprolol  5 mg Q4H PRN    ondansetron  4 mg Q6H PRN    oxyCODONE  5 mg Q6H PRN    sodium chloride (PF)  10 mL PRN    sodium chloride (PF)  20 mL PRN        Review of Systems:   Nutrition: Diet Order Regular; Liquid Consistency: Thin   Gastrointestinal: Diarrhea  Mobility:  Progressive    Vital Signs:  Temperature:  [98.1 F (36.7 C)-100.2 F (37.9 C)] 98.8 F (37.1 C) (12/19 0400)  Blood pressure (BP): (120-148)/(61-89) 120/85 (12/19 0600)  Heart Rate:  [91-120] 92 (12/19 0600)  Respirations:  [16-41] 21 (12/19 0600)  Pain Score: 9 (12/19 0600)  O2 Device: None (Room air) (12/19 0600)  SpO2:  [97 %-100 %] 97 % (12/19 0600)  RASS Score: Alert and calm    Wt Readings from Last 1 Encounters:   07/23/16 97.5 kg (214 lb 15.2 oz)       Intake/Output (Current Shift):  12/18 0600 - 12/19 0559  In: 3710 [P.O.:500; I.V.:832]  Out: 1550 [Urine:1450]      Intake/Output Summary (Last 24 hours) at 07/24/16 0649  Last data filed at 07/24/16 0600   Gross per 24 hour   Intake             1332 ml   Output  1390 ml   Net              -58 ml       Stool output: 0cc    Physical Exam:  General: Well developed, well nourished [place] American male. Resting in bed. NAD.   Heart: Mild sinus tach  Lungs: Breathing comfortably on RA.  Abdomen: Soft, decreased distention. Non-peritoneal.   Extremities:   - RLE: dopplerable PT and DP signals present   - LLE: edematous, wound without erythema. Intact. Dressing in place. Dopplerable AT signal.       Diagnostic Data:  Laboratory data:   Lab Results   Component Value Date    K 3.3 (L) 07/24/2016    CL 107 07/24/2016    BUN 7 07/24/2016    CREAT 0.8 07/24/2016    GLU 90 07/24/2016    [place] 8.6 07/24/2016     Lab Results   Component Value Date    HGB 8.3 (L) 07/24/2016    HCT 26.0 (L) 07/24/2016     Lab Results   Component Value Date    HGB 8.3 (L) 07/24/2016    HCT 26.0 (L) 07/24/2016    NRBC 1 (HH) 07/24/2016     No results found for: AST, ALT, ALK, TBILI, DBILI, TP, ALB  Lab Results   Component Value Date    PTT 76.7 (H) 07/24/2016     No results found for: ARTPH, ARTPO2, ARTPCO2  No results found for: PH, PO2, PCO2   No results found for: TSH, FREET4, T3  No results found for: CPK, CKMBH, TROPONIN  No results found for: CPK, CKMBH, TROPONIN  No results found for: PHUA, SGUA, GLUCOSEUA, KETONEUA, BLOODUA, [place], LEUKESTUA, NITRITEUA, [place], RBCUA, EPITHCELLSUA, CRYSTALSUA, COMMENTSUA      ASSESSMENT / PLAN:  23 year oldmale admitted with 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 Patient underwent attempted popliteal-posterior tibial artery bypass on 12/1 with resulting graft failure w/ reoperation on 12/2 and underwent popliteal to peroneal bypass with jump graft from peroneal artery to posterior tibial artery. His post-op course was complicated by acute ETOH withdrawal d/t undisclosed ETOH abuse as well as c.diff colitis.      Neuro: Continue PO and IV pain medications. Mentation and agitation improved, requiring less ativan. CIWA, wean as able.   CV: Tachycardic d/t infection and alcoholic withdrawal. Now improving, 90-100s.  Pulm: H. Flu 3+ in sputum, but w/o focal consolidation. deferring tx for now.   GI: Regular diet. Encourage PO intake. PO vanco and flagyl for c.diff.  Renal: Normal creatinine. Replete lytes PRN. Good UOP.   Heme:H/H stable.Continue heparin gtt to decrease risk of LLE thrombosis.  ID: C diff colitis. PR vanco d/c 12/18. Afebrile. No stool output since rectal tube removed.   Endocrine: [facility]  Dispo: Transfer to [facility] today

## 2016-07-24 NOTE — Plan of Care (Signed)
Problem: Promotion of health and safety  Goal: Promotion of Health and Safety  The patient remains safe, receives appropriate treatment and achieves optimal outcomes (physically, psychosocially, and spiritually) within the limitations of the disease process by discharge.   Outcome: Progressing toward goal, anticipate improvement over: Next 24-48 hours   07/24/16 0528 07/24/16 1818   Adult/Peds Plan of Care   Standard of Care/Policy --  Critical Care SOC;[facility];Falls reduction   Outcome Evaluation (rationale for progressing/not progessing Pt is more calm and behaves appropriately to situations. Follows commands. Disoriented to situation and month of the year. All restraints are off. Pt slept well throughout the night. Good doppler pulses on BLEs.  --    Patient Specific Goal, Promotion of Health & Safety Frequent re-orientation and neuro check q4h.  --    Additional Individualized Interventions/Recommendations  encourage to participate in physical therapy --    Additional Individualized Interventions/Recommendations q4h dopper check on BLE --    Additional Individualized Interventions/Recommendations --  video monitor at bed for safety    Nursing Shift Summary    No data found.    No data found.      No data found.    No data found.

## 2016-07-24 NOTE — Progress Notes (Signed)
Event note    S: paged regarding increased pain at LLE. Has increased somewhat during the day since ambulating increased. Does not report any numbness, deficits.     O:  Temperature:  [98.2 F (36.8 C)-98.8 F (37.1 C)] 98.4 F (36.9 C) (12/19 1938)  Blood pressure (BP): (120-156)/(61-93) 139/83 (12/19 1938)  Heart Rate:  [88-112] 88 (12/19 1938)  Respirations:  [16-41] 18 (12/19 1938)  Pain Score: 7 (12/19 2210)  O2 Device: None (Room air) (12/19 1938)  SpO2:  [96 %-100 %] 97 % (12/19 1938)    Nad, aox3  ncat  LLE AT and PT dopplerable and brisk. No loss of sensation. Bruise at great toe. Edematous. Warm and perfused. Wriggles toes without difficulty.    AP:   -oxy10mg  for severe pain  -dilaudid prn increased 0.4mg  q2h    Rush Farmeraymond Katria Botts, MD  PGY-1

## 2016-07-24 NOTE — Progress Notes (Signed)
Inpatient Heparin Drug Monitoring Note    Jerome QuarryBrandon Hunt is a 40 year old male on anticoagulation therapy for lower extremity ischemia.      Past Medical History:  Past Medical History:   Diagnosis Date    Alcohol withdrawal delirium, acute, hyperactive (CMS-HCC)     Alcoholism /alcohol abuse (CMS-HCC)     Clostridium difficile colitis        Allergies:  Allergies   Allergen Reactions    No Known Allergies [Other] Other     Received name: No Known Allergies       Medications:    Current Inpatient Medications:   acetaminophen  650 mg Q6H    aspirin  81 mg Daily    folic acid  1 mg Daily    influenza vaccine >=3 yrs  0.5 mL Prior to discharge    metoprolol tartrate  25 mg Q12H    metronidazole (FLAGYL) IVPB  500 mg Q8H NR    potassium chloride  20 mEq Once    povidone-iodine  1 Application BID    QUEtiapine  150 mg Q12H    thiamine  100 mg Daily    vancomycin  500 mg Q6H NR      dextrose-sodium chloride 5%-0.45% 10 mL/hr at 07/23/16 0700    heparin (porcine) 2,050 Units/hr (07/24/16 0622)       aPTT Goal:   65 - 90 seconds    Dosing weight = 91 kg     Current Heparin Rate:     Dose (Units/hr) Heparin: 2050 Units/hr    Labs:    Coags:  Recent Labs      07/21/16   1847  07/22/16   0520  07/22/16   1440  07/22/16   2152  07/23/16   0401  07/23/16   1612  07/24/16   0402   PTT  116.5*  44.8*  129.4*  80.4*  77.2*  87.9*  76.7*       Labs:  (Last 3 days):    Recent Labs      07/22/16   0520  07/23/16   0401  07/24/16   0402   HGB  7.7*  7.8*  8.3*   HCT  23.6*  23.3*  26.0*   CREAT  0.7  0.7  0.8       Drug/Disease Interactions and Assessments:    - Bleeding Assessment:  H/H is stable. Patient does not exhibit any signs or symptoms of bleeding (spoke to RN).     - Current Nutrition Assessment:   Recent changes in nutrition/diet? no  Diet Order Regular; Liquid Consistency: Thin    Assessment/Plan:  - aPTT= 76.7, within therapeutic goal range    - Will continue heparin maintenance infusion at 2050 units/hour   - Check aPTT every 24 hours, next PTT tomorrow with AM labs   - Continue to monitor for signs and symptoms of bleeding  - Patient does not have IM medications ordered. If given use small needle and apply pressure to injection site for 2 minutes.    Pharmacy will continue to monitor closely. For questions please call [phone]    Early CharsLaura Claire Delonda Coley, [facility] LPHARMD

## 2016-07-24 NOTE — Interdisciplinary (Signed)
Occupational Therapy Daily Follow up Note    Current Functional Level  [place] AM-PAC: Daily Activity  Assistance Needed to Put on and Take off Regular Lower Body Clothing: Total (Dependent)  Assistance Needed to Bathe, Including Washing, Rinsing, and Drying: A lot (Mod/Max Assist)  Assistance Needed to Toilet [employer], Bedpan, or Urinal): Total (Dependent)  Assistance Needed to Put on and Take off Regular Upper Body Clothing: A lot (Mod/Max Assist)  Assistance Needed to Take Care of Personal Grooming Such as Brushing Teeth: A lot (Mod/Max Assist)  Assistance Needed to Eat Meals: A lot (Mod/Max Assist)  AM-PAC Daily Activity Total Score: 10  Assessment: AM-PAC Daily Activity - Current Functional Impairment Level:  CL: 60-79% impaired  Post Acute Discharge Considerations  Anticipated Required Level of Cognitive Assistance: All of the time  Anticipated Required Level of Safety Assistance: Constant supervision  Anticipated Required Level of Self Care Assistance: All of the time  Anticipated Physical Barriers Impacting Discharge: Stairs/steps  Anticipated Level of Therapy Tolerance and Ongoing Therapy Needs: Other (comment) (TBD if Pt able to tolerate intensive rehab vs slow based)  Equipment Recommendations: To be determined as patient progresses with therapy    Admit Date: 07/06/2016              Preferred Language:English    Plan  Patient to continue therapy for     ICD-10-CM ICD-9-CM    1. Peripheral vascular disease (CMS-HCC) I73.9 443.9 Case Request: Aortogram, selective left lower extremity angiogram, possible percutaneous intervention, possible open bypass revision, possible greater saphenous vein harvest best available site      Case Request: Aortogram, selective left lower extremity angiogram, possible percutaneous intervention, possible open bypass revision, possible greater saphenous vein harvest best available site      Pathology - Tissue Exam      CANCELED: Pathology - Tissue Exam    2. Dysphagia, unspecified type R13.10 787.20    3. Impaired mobility and ADLs Z74.09 799.89    4. Cognitive communication deficit R41.841 799.52    5. Mobility impaired Z74.09 799.89    6. Muscle weakness of extremity M62.81 728.87           Therapy Goals                                                                                        Current Level Goals for Episode of Care    Functional Deficit     Long term Functional Goal      Impairment #1 ADL function Short Term Goal #1 Impairment: ADL function  ADL Function Goal 1: General  General: Patient demonstrates the ability to perform and complete lower body dressing in seated position with  Level of Assistance: moderate assistance  Number of Visits: 3-5  Goal Status: New   Impairment #2 ADL function Short Term Goal #2 Impairment: ADL function  ADL Function Goal 2: General  General: Patient demonstrates the ability to perform and complete a toilet transfer in the bathroom with  Level of Assistance: moderate assistance  Number of Visits: 3-5  Goal Status: New   Impairment #3   Short Term Goal #3  Impairment #4   Short Term Goal #4     Functional Limitation Reporting    Rationale: Acceptable functional assessment;Clinical judgement  Functional Assessment Tool: AM-PAC- 6 Clicks Daily Activity Inpatient  Visit Type: Initial ongoing  Functional Assessment Tool  Rationale: Acceptable functional assessment;Clinical judgement  Functional Assessment Tool: AM-PAC- 6 Clicks Daily Activity Inpatient  Assessment: AM-PAC Daily Activity - Current Functional Impairment Level:  CL: 60-79% impaired     Objective Findings: Patient performed supine to sit at min A. sit to stand at min A. ambulated out of room and returned to room at min A. transferred to chair at min A          Occupational Therapy Plan and Progress Report       07/24/16 1400    Patient stated Goal    Patient stated goal to walk      07/24/16 1400    Subjective     Status since last treatment Patient agreeable with therapy and eager to engage in therapy.       07/24/16 1400    Inpatient Pain Assessment     Patient status Patient confused but agreeable    Patient premedicated prior to treatment with Oral pain medication    IP Pain  Numeric Pain Rating Scale      07/24/16 1400    Numeric Pain Rating Scale     Pain Intensity - rating at present 0    Pain Intensity - rating at worst  5    Pain Intensity- rating after treatment 1    Location  LLE      07/24/16 1400    Activity Restrictions     Activity Restrictions None      07/24/16 1400    Assessment    Assessment Patient with improved following and strength. was able to safeky follow directions and perform ADLs. Weakness is improving however continues to be a limiting factor.      07/24/16 1400    Treatment Provided Today    Payor Medicare/Private      07/24/16 1400    Therapeutic Procedures    Self-Care/ADL Training ([id number]) Activities of daily living training        Total TIMED Treatment (min) 45        Self-Care/ADL Training dressing and transfers      07/24/16 1400    Treatment Time    Treatment Start Time 0930    Total TIMED Treatment (min) 45    Total Treatment Time (min) 45      07/24/16 1400    Inpatient Patient Safety Considerations    Patient Call Light Within Reach Yes    Patient Left Sitting In a chair on a pillow/cushion            OT Functional       07/24/16 1400    Activities of Daily Living (ADLs)    Lower Extremity Dressing Maximum assistance (50-75% assistance)    Lower Extremity Dressing FIM Score 2    Toileting Maximum assistance (50-75% assistance)    Toileting FIM Score 2      07/24/16 1400    Mobility for Daily Activities    Patient Participation in Mobilization Activities Patient agreeable to participate in mobility/or out of bed activities    Rolling Assistance level    Assistance Level Minimum assistance (25% assistance)    Supine to Sit Assistance level     Assistance Level Minimum assistance (25% assistance)    Sit to Stand  Assistance level    Assistance Level Minimum assistance (25% assistance)

## 2016-07-24 NOTE — Plan of Care (Signed)
Problem: Promotion of health and safety  Goal: Promotion of Health and Safety  The patient remains safe, receives appropriate treatment and achieves optimal outcomes (physically, psychosocially, and spiritually) within the limitations of the disease process by discharge.   Outcome: Progressing toward goal, anticipate improvement over: next 12-24 hours   07/24/16 0528   Adult/Peds Plan of Care   Standard of Care/Policy Critical Care SOC;[facility];Falls reduction   Outcome Evaluation (rationale for progressing/not progessing Pt is more calm and behaves appropriately to situations. Follows commands. Disoriented to situation and month of the year. All restraints are off. Pt slept well throughout the night. Good doppler pulses on BLEs.    Patient Specific Goal, Promotion of Health & Safety Frequent re-orientation and neuro check q4h.    Additional Individualized Interventions/Recommendations  encourage to participate in physical therapy   Additional Individualized Interventions/Recommendations q4h dopper check on BLE     Nursing Shift Summary    Rounding for the past 12 hrs:   Physician Rounding   07/24/16 0500 My nursing colleague or I joined the physician during  his/her interaction/rounds with the patient at least once during my shift

## 2016-07-24 NOTE — Interdisciplinary (Signed)
Physical Therapy Daily Follow up Note        Post Acute Discharge Considerations  Anticipated required level of cognitive assistance: Some of the time  Anticipated required level of safety assistance: Occasional close supervision  Anticipated required level of physical assistance: One person assist;Most of the time  Anticipated physical barriers impacting discharge: Stairs/steps  Anticipated  level of therapy tolerance and ongoing therapy needs: Would benefit from intensive Physical Therapy intervention post acute discharge to maximize functional independence  Equipment recommendations : Front Wheeled Soil scientist Justification Statement: Patient safety and mobility is improved or enhanced by the use of a walker    Admit Date: 07/06/2016         Preferred Language:English    Patient stated goal: to walk          PT Progress Report       07/24/16 1400    Assessment     Assessment  pt making daily gains to improve OOB tolerance and able to participate in gait training in hallway today. pt's cognitive status improving significantly since previous treatment as well. Pt remains a fall risk at this time and will continue to benefit from ongoing skilled PT to further improve ongoing deficits.     Rehab Potential Good      07/24/16 1400    Inpatient Treatment Plan    Frequency of treatment 5 times per week    Status of treatment Patient evaluated and will benefit from ongoing skilled therapy      07/24/16 1400    Treatment Plan Discussion    Treatment Plan Discussion & Agreement Patient      07/24/16 1400    Patient/Family Education    Learner(s) Patient    Patient/family training in appropriate therapeutic interventions Ongoing;Verbalized understanding    Education Topic(s) Benefits of out of bed to chair;Exercise Program;Mobility;Typical activity progression during hospitalization      07/24/16 1400    Subjective    Status since last treatment  pt recieved supine in bed and agreeabl to  treatment. Pt reports that his mood is much improved today and that he is ready to work w/ PT.       07/24/16 1400    Numeric Pain Rating Scale     Pain Intensity - rating at present 0    Pain Intensity - rating at worst  2    Pain Intensity- rating after treatment 2    Frequency  Intermittent    Description  Throbbing;Numbness    Location  --   L foot      07/24/16 1400    Inpatient Pain Assessment    Patient status Patient agreeable to treatment;Nursing in agreement for treatment    Patient premedicated prior to treatment with Oral medication      07/24/16 1400    Activity Restrictions    Activity Restrictions None      07/24/16 1400    Inpatient Precautions/Contraindications    Precautions Fall    Fall Socks/charm    Isolation Contact    Equipment/Lines Currently Present at Bedside Lines/drains/airways    Lines/Drains/Airways IV;Telemetry      07/24/16 1400    Inpatient Patient Safety Considerations    Patient call light within reach Yes    Patient left sitting  In a chair on a pillow/cushion    Patient instructed/Nursing informed pt to be sitting no longer than as tolerated    Patient/family instructed in and able to return demonstrate pressure relief techniques  Yes    Patient requires for safe ambulation Front wheeled walker    The patient may be at risk for falls Yes- Notified Nursing   Berna SpareMarcus    Family/caregiver present for treatment None Present      07/24/16 1400    Therapy Plan Communication    Therapy Plan Communication Discussed therapy plan with Nursing      07/24/16 1400    Physical Therapy Patient Discharge Instructions    Your Physical Therapist suggests the following Continue to complete your home exercise program daily as instructed;Continue to follow your prescribed mobility precautions when moving in and out of bed and walking  as instructed;Continue to use your assistive device as instructed when walking to improve your stability and prevent falls;Supervision with  walking is suggested for increased safety      07/24/16 1400    Treatment provided today    Payor  Medicare/Private      07/24/16 1400    Therapeutic Procedures    Gait Training ([id number]) Assistive device training;Patient education       Total TIMED Treatment (min)  15    Therapeutic Activities (97530)  Functional activities       Total TIMED Treatment (min)  30      07/24/16 1400    Focus for Next Treatment    Focus for Next Treatment Balance activities;Gait training;Therapeutic activities;Therapeutic exercise      07/24/16 1400    Treatment Time     Treatment start time 1030    Total TIMED Treatment  (min) 45    Total Treatment Time (min) 45            PT Functional       07/24/16 1400    Functional Mobility - Detailed    Rolling Assistance level    Assistance Level Supervised    Supine to Sit Assistance level    Assistance Level Supervised    Sit to Stand Assistance level    Assistance Level Minimum assistance (25% assistance)    Stand to Sit Assistance level    Assistance Level Minimum assistance (25% assistance)    Transfers Bed to/from Chair Assistance level;Device used    Assistance Level Minimum assistance (25% assistance)    Device Used Front wheeled walker      07/24/16 1400    Gait    Gait Assessment Gait limitations and deviations present    Assistance Level Minimum assistance (25% assistance)    Assistive Device Used Front wheeled walker    Distance Ambulated Today 75 ft    Terrain Management Level surfaces    Level Surfaces Needs assist to navigate safely    Stair/Step Management --                 07/24/16 1400    Gait    Gait Assessment Gait limitations and deviations present    Assistance Level Minimum assistance (25% assistance)    Assistive Device Used Front wheeled walker    Distance Ambulated Today 75 ft    Terrain Management Level surfaces    Level Surfaces Needs assist to navigate safely    Stair/Step Management --              PT Discharge with Treatment Report       07/24/16 1400     Physical Therapy Patient Discharge Instructions    Your Physical Therapist suggests the following Continue to complete your home exercise program daily as instructed;Continue to follow your prescribed mobility  precautions when moving in and out of bed and walking  as instructed;Continue to use your assistive device as instructed when walking to improve your stability and prevent falls;Supervision with walking is suggested for increased safety

## 2016-07-25 LAB — BASIC METABOLIC PANEL, BLOOD
BUN: 9 mg/dL (ref 7–25)
CO2: 26 mmol/L (ref 21–31)
Calcium: 8.5 mg/dL — ABNORMAL LOW (ref 8.6–10.3)
Chloride: 106 mmol/L (ref 98–107)
Creat: 0.8 mg/dL (ref 0.7–1.3)
Electrolyte Balance: 8 mmol/L (ref 2–12)
Glucose: 107 mg/dL (ref 70–115)
Potassium: 3.3 mmol/L — ABNORMAL LOW (ref 3.5–5.1)
Sodium: 140 mmol/L (ref 136–145)
eGFR - high estimate: >60 (ref >59)
eGFR - low estimate: >60 (ref >59)

## 2016-07-25 LAB — CBC WITH DIFF, BLOOD
Basophils %: 0.9 %
Basophils Absolute: 0.2 10*3/uL (ref 0.0–0.2)
Eosinophils %: 3.8 %
Eosinophils Absolute: 0.7 10*3/uL — ABNORMAL HIGH (ref 0.0–0.5)
Hematocrit: 25.3 % — ABNORMAL LOW (ref 39.5–50.0)
Hgb: 8.3 G/DL — ABNORMAL LOW (ref 13.5–16.9)
Lymphocytes %.: 24.8 %
Lymphocytes Absolute: 4.8 10*3/uL — ABNORMAL HIGH (ref 0.9–3.3)
MCH: 31.1 PG (ref 27.0–33.5)
MCHC: 32.7 G/DL (ref 32.0–35.5)
MCV: 95.1 FL (ref 81.5–97.0)
MPV: 9.2 FL (ref 7.2–11.7)
Metamyelocytes %: 1 %
Metamyelocytes Absolute: 0.2 10*3/uL — ABNORMAL HIGH
Monocytes %: 10.5 %
Monocytes Absolute: 2 10*3/uL — ABNORMAL HIGH (ref 0.0–0.8)
PLT Count: 727 10*3/uL — ABNORMAL HIGH (ref 150–400)
Platelet Morphology: NORMAL
RBC: 2.66 10*6/uL — ABNORMAL LOW (ref 3.70–5.00)
RDW-CV: 16.2 % — ABNORMAL HIGH (ref 11.6–14.4)
Seg Neutro % (M): 59 %
Seg Neutro Abs (M): 11.4 10*3/uL — ABNORMAL HIGH (ref 2.0–7.5)
White Bld Cell Count: 19.3 10*3/uL — ABNORMAL HIGH (ref 4.0–10.5)

## 2016-07-25 LAB — APTT, BLOOD: PTT: 89.6 s — ABNORMAL HIGH (ref 24.1–36.3)

## 2016-07-25 LAB — PHOSPHORUS, BLOOD: Phosphorus: 3.5 MG/DL (ref 2.5–5.0)

## 2016-07-25 LAB — MAGNESIUM, BLOOD: Magnesium: 2 mg/dL (ref 1.9–2.7)

## 2016-07-25 MED ORDER — GABAPENTIN 100 MG OR CAPS
200.0000 mg | ORAL_CAPSULE | Freq: Three times a day (TID) | ORAL | Status: DC
Start: 2016-07-25 — End: 2016-07-26
  Administered 2016-07-25 (×4): 200 mg via ORAL
  Filled 2016-07-25 (×3): qty 2

## 2016-07-25 MED ORDER — SODIUM CHLORIDE 0.9 % IV SOLN
40.00 meq | Freq: Once | INTRAVENOUS | Status: AC
Start: 2016-07-25 — End: 2016-07-25
  Administered 2016-07-25 (×2): 40 meq via INTRAVENOUS
  Filled 2016-07-25: qty 20

## 2016-07-25 MED ORDER — QUETIAPINE FUMARATE 50 MG OR TABS
150.0000 mg | ORAL_TABLET | Freq: Two times a day (BID) | ORAL | Status: DC | PRN
Start: 2016-07-25 — End: 2016-07-26
  Administered 2016-07-25 (×2): 150 mg via ORAL
  Filled 2016-07-25: qty 3

## 2016-07-25 MED ORDER — APIXABAN 5 MG PO TABS
5.0000 mg | ORAL_TABLET | Freq: Two times a day (BID) | ORAL | Status: DC
Start: 2016-07-25 — End: 2016-07-27
  Administered 2016-07-25 – 2016-07-27 (×6): 5 mg via ORAL
  Filled 2016-07-25 (×7): qty 1

## 2016-07-25 MED ORDER — VANCOMYCIN 50 MG/ML ORAL SOLN (COMPOUNDED) (~~LOC~~)
125.0000 mg | Freq: Four times a day (QID) | ORAL | Status: DC
Start: 2016-07-25 — End: 2016-07-27
  Administered 2016-07-25 – 2016-07-27 (×10): 125 mg via ORAL
  Filled 2016-07-25 (×17): qty 2.5

## 2016-07-25 NOTE — Interdisciplinary (Signed)
CM met with pt. And his wife at bedside, introduced self and role of CM with the Vascular Surgery team.  Discussed dispo preferences.  Pt's wife will be at home 24/7 and can care for him upon discharge.  They live on the second floor of an apt.  PT to identify DME needs for pt.  Pt. Does not wish to go to a SNF at this time.  CM will continue to follow for discharge needs.  -Christel Mormon, BSN, RN.

## 2016-07-25 NOTE — Interdisciplinary (Signed)
Occupational Therapy Discharge Summary Note    Current Functional Level  [place] AM-PAC: Daily Activity  Assistance Needed to Put on and Take off Regular Lower Body Clothing: A little (Supervised/Min Assist)  Assistance Needed to Bathe, Including Washing, Rinsing, and Drying: A little (Supervised/Min Assist)  Assistance Needed to Toilet [employer], Bedpan, or Urinal): A little (Supervised/Min Assist)  Assistance Needed to Put on and Take off Regular Upper Body Clothing: A little (Supervised/Min Assist)  Assistance Needed to Take Care of Personal Grooming Such as Brushing Teeth: A little (Supervised/Min Assist)  Assistance Needed to Eat Meals: None (Independent)  AM-PAC Daily Activity Total Score: 19  Assessment: AM-PAC Daily Activity - Current Functional Impairment Level:  CK: 40-59% impaired  Post Acute Discharge Considerations  Anticipated Required Level of Cognitive Assistance: None  Anticipated Required Level of Safety Assistance: Distant supervision  Anticipated Required Level of Self Care Assistance: One person assist;Some of the time  Anticipated Physical Barriers Impacting Discharge: Stairs/steps  Anticipated Level of Therapy Tolerance and Ongoing Therapy Needs: Other (comment) (TBD if Pt able to tolerate intensive rehab vs slow based)  Equipment Recommendations: Shower chair    Admit Date: 07/06/2016              Preferred [place]    Plan  Patient to continue therapy for     ICD-10-CM ICD-9-CM    1. Peripheral vascular disease (CMS-HCC) I73.9 443.9 Case Request: Aortogram, selective left lower extremity angiogram, possible percutaneous intervention, possible open bypass revision, possible greater saphenous vein harvest best available site      Case Request: Aortogram, selective left lower extremity angiogram, possible percutaneous intervention, possible open bypass revision, possible greater saphenous vein harvest best available site      Pathology - Tissue Exam       CANCELED: Pathology - Tissue Exam   2. Dysphagia, unspecified type R13.10 787.20    3. Impaired mobility and ADLs Z74.09 799.89    4. Cognitive communication deficit R41.841 799.52    5. Mobility impaired Z74.09 799.89    6. Muscle weakness of extremity M62.81 728.87        Patient Stated Goal: To get home before Christmas  Therapy Goals                                                                                        Current Level Goals for Episode of Care    Functional Deficit     Long term Functional Goal      Impairment #1 ADL function Short Term Goal #1 Impairment: ADL function  ADL Function Goal 1: General  General: Patient demonstrates the ability to perform and complete lower body dressing in seated position with  Level of Assistance: moderate assistance  Number of Visits: 3-5  Goal Status: Met   Impairment #2 ADL function Short Term Goal #2 Impairment: ADL function  ADL Function Goal 2: General  General: Patient demonstrates the ability to perform and complete a toilet transfer in the bathroom with  Level of Assistance: moderate assistance  Number of Visits: 3-5  Goal Status: Met  Functional Limitation Reporting    Rationale: Acceptable functional assessment;Clinical judgement  Functional Assessment Tool: AM-PAC- 6 Clicks Daily Activity Inpatient  Visit Type: Initial ongoing  Functional Assessment Tool  Rationale: Acceptable functional assessment;Clinical judgement  Functional Assessment Tool: AM-PAC- 6 Clicks Daily Activity Inpatient  Assessment: AM-PAC Daily Activity - Current Functional Impairment Level:  CK: 40-59% impaired     Objective Findings: Pt able to participate well with therapy, overall supervised wtih ADLs.           Occupational Therapy Plan and Progress Report       07/25/16 1400    Subjective    Status since last treatment Pt motivated to get home before Christmas, agreeable to therapy.       07/25/16 1400    Inpatient Pain Assessment      Patient status Patient agreeable to treatment    Patient premedicated prior to treatment with Oral pain medication   at end of session     IP Pain  Numeric Pain Rating Scale      07/25/16 1400    Numeric Pain Rating Scale     Pain Intensity - rating at present 5    Pain Intensity - rating at worst  6    Pain Intensity- rating after treatment 6    Location  LLE      07/25/16 1400    Activity Restrictions     Activity Restrictions None      07/25/16 1400    Assessment    Assessment Pt participated well with therapy. Pt transferred from su[place] to sitting edge of bed, with supervision. Pt able to complete LB dressing with supervision for set-up and safety, donned socks and shorts. Pt ambulated in th hall 250 ft using FWW with supervision, to improve activity tolerance. Pt took rest breaks as needed, did well with self pacing. Pt transferred to chair at end of session with S, assist to elevated LLE. Pt educated in activity modification at home, able to verbalize a good understanding. Recomend to pt that he purchase a shower chair to improve safety with showering, pt verbalize a good understanding. Pt educated to continue walking with staff to increase activity tolerance.      Progress Towards Goals Patient has made significant progress towards achievement of goals      07/25/16 1400    Treatment Provided Today    Payor Medicare/Private      07/25/16 1400    Therapeutic Procedures    Therapeutic Activities (97353) Assistance/facilitation of bed mobility;Functional activities;Weight shift activities to improve safety in unsupported sitting or standing;Transfer training with weight shift and direction change        Total TIMED Treatment (min) 45        Therapeutic Activities bed mobility, ADL participation, functional transfers, functional mobility       07/25/16 1400    Treatment Time    Treatment Start Time 1300    Total TIMED Treatment (min) 45    Total Treatment Time (min) 45      07/25/16 1400     Inpatient Patient Safety Considerations    Patient Call Light Within Reach Yes    Patient Left Sitting In a chair on a pillow/cushion   RN Shelly with pt       07/25/16 1400    Therapy Plan Communication    Therapy Plan Communication Discussed therapy plan with Nursing      07/25/16 1400    Inpatient Progress report  Patient participation Excellent, patient participated in all treatment sessions    Patient compliance with therapy program Excellent    Treatment Progression Patient making excellent progress towards set functional goals    Response to therapy Excellent            OT Functional       07/25/16 1400    Activities of Daily Living (ADLs)    Patient Participation in ADL Tasks Patient agreeable to participate in ADL tasks    Self Feeding Supervised;Independent    Self Grooming Supervised    Upper Extremity Dressing Supervised    Lower Extremity Dressing Supervised   able to thread sock and shirts sitting edge of bed     Bathing Supervised    Toileting Supervised    Toilet Transfer Supervised      07/25/16 1400    Activity Tolerance    General Activity Tolerance Good - tolerates 75-100% of treatment session      07/25/16 1400    Communication Assessment    Communication Restrictions No communication limitations or impairments noted. Current status of hearing, speech and vision allow functional communication.      07/25/16 1400    Mobility for Daily Activities    Patient Participation in Mobilization Activities Patient agreeable to participate in mobility/or out of bed activities    [place] level    Assistance Level Supervised    Supine to [place] level    Assistance Level Supervised    Sit to [place] level    Assistance Level Supervised                                    Occupational Therapy Discharge Summary with Treatment       07/25/16 1400    Discharge Date    Discharge Date 07/25/16      07/25/16 1400    Inpatient Therapy Overview     Reason for Discharge Goals met no further skilled treatment indicated    Discharge Destination Home    Response to therapy Good    Patient participation Excellent, patient participated in all treatment sessions    Patient compliance with therapy program Excellent

## 2016-07-25 NOTE — Progress Notes (Signed)
[facility] NOTE       Current Hospital Stay:   19 days - Admitted on: 07/06/2016    64M with a traumatic LLE injury 3 y.a. presented with pain to LLE concerning for claudication.  S/p left popliteal to distal PT bypass  S/p bypass revision, left popliteal to peroneal and peroneal to PT jump graft  C.diff colitis, acute respiratory failure, sepsis, DT    07/06/16:  L pop to distal PTA bypass  L saphenous vein harvest  L pop a. angioplasty  L distal PTA angioplasty     07/07/16:  Redo L transinterosseous below-knee popliteal-peroneal and sequential peroneal-distal posterior tibal-medial tarsal reverse saphenous vein bypass   L distal segmental fibula resection to expose distal peroneal artery,  Thrombectomy of L below-knee popliteal-posterior tibial reverse saphenous vein bypass    Subjective:  NAOE. Transferred to Tele yesterday. Endorses doing well. Had increased LLE pain overnight w/ increased ambulation, improved w/ additional pain medication. Tolerating diet. BM x1 loose and large. Denies F/C/N/V.      Objective:      Current Medications:  . acetaminophen  650 mg Q6H   . aspirin  81 mg Daily   . folic acid  1 mg Daily   . influenza vaccine >=3 yrs  0.5 mL Prior to discharge   . metoprolol tartrate  25 mg Q12H   . metronidazole (FLAGYL) IVPB  500 mg Q8H NR   . povidone-iodine  1 Application BID   . QUEtiapine  150 mg Q12H   . thiamine  100 mg Daily   . vancomycin  500 mg Q6H NR     . dextrose-sodium chloride 5%-0.45% 10 mL/hr at 07/23/16 0700   . heparin (porcine) 2,050 Units/hr (07/24/16 1810)     . HYDROmorphone  0.4 mg Q2H PRN   . LORazepam  2 mg Q1H PRN    Or   . LORazepam  4 mg Q1H PRN   . metoprolol  5 mg Q4H PRN   . ondansetron  4 mg Q6H PRN   . oxyCODONE  10 mg Q6H PRN   . oxyCODONE  5 mg Q6H PRN   . sodium chloride (PF)  10 mL PRN   . sodium chloride (PF)  20 mL PRN       Review of Systems:   Nutrition: Diet Order Regular; Liquid Consistency: Thin   Gastrointestinal: Diarrhea  Mobility:   Progressive    Vital Signs:  Temperature:  [97.9 F (36.6 C)-98.6 F (37 C)] 97.9 F (36.6 C) (12/20 0500)  Blood pressure (BP): (119-156)/(63-93) 119/77 (12/20 0500)  Heart Rate:  [83-112] 83 (12/20 0500)  Respirations:  [18-36] 18 (12/20 0500)  Pain Score: 9 (12/20 0425)  O2 Device: None (Room air) (12/20 0500)  SpO2:  [93 %-99 %] 98 % (12/20 0500)  RASS Score: Restless    Wt Readings from Last 1 Encounters:   07/23/16 97.5 kg (214 lb 15.2 oz)       Intake/Output (Current Shift):  12/19 0600 - 12/20 0559  In: 1836.8 [P.O.:1550; I.V.:286.8]  Out: 1475 [Urine:1475]      Intake/Output Summary (Last 24 hours) at 07/25/16 0727  Last data filed at 07/25/16 0425   Gross per 24 hour   Intake          1806.25 ml   Output             1475 ml   Net           331.25  ml       Physical Exam:  General: Well developed, well nourished [place] American male. Resting in bed. NAD.   Heart: Mild sinus tach  Lungs: Breathing comfortably on RA.  Abdomen: Soft, decreased distention. Non-peritoneal.   Extremities:   - RLE: dopplerable PT and DP signals  - LLE: edematous, wound without erythema. Intact. Sutures in place. Dopplerable AT signal.       Diagnostic Data:  Laboratory data:   Lab Results   Component Value Date    K 3.3 (L) 07/25/2016    CL 106 07/25/2016    BUN 9 07/25/2016    CREAT 0.8 07/25/2016    GLU 107 07/25/2016    [place] 8.5 (L) 07/25/2016     Lab Results   Component Value Date    HGB 8.3 (L) 07/25/2016    HCT 25.3 (L) 07/25/2016     Lab Results   Component Value Date    HGB 8.3 (L) 07/25/2016    HCT 25.3 (L) 07/25/2016    NRBC 1 (HH) 07/24/2016     No results found for: AST, ALT, ALK, TBILI, DBILI, TP, ALB  Lab Results   Component Value Date    PTT 89.6 (H) 07/25/2016     No results found for: ARTPH, ARTPO2, ARTPCO2  No results found for: PH, PO2, PCO2  No results found for: TSH, FREET4, T3  No results found for: CPK, CKMBH, TROPONIN  No results found for: CPK, [place], TROPONIN  No results found for: PHUA, SGUA,  GLUCOSEUA, KETONEUA, BLOODUA, [place], LEUKESTUA, NITRITEUA, [place], RBCUA, EPITHCELLSUA, CRYSTALSUA, [place] / PLAN:  35 year oldmale admitted with 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 Patient underwent attempted popliteal-posterior tibial artery bypass on 12/1 with resulting graft failure w/ reoperation on 12/2 and underwent popliteal to peroneal bypass with jump graft from peroneal artery to posterior tibial artery. His post-op course was complicated by acute ETOH withdrawal d/t undisclosed ETOH abuse as well as c.diff colitis. Currently doing very well.      Neuro: Continue PO and IV pain medications. Add gabapentin 236m TID. Mentation significantly improved. Agitation resolved. Will d/c CIWA protocol.  CV: Tachycardic in the setting of infection and alcoholic withdrawal. Now improving, 80-90s mostly.  Pulm: H. Flu 3+ in sputum, but w/o focal consolidation. Deferring tx for now.   GI: Regular diet. PO vanco and flagyl for c.diff.  Renal: Normal creatinine. Replete lytes PRN. Good UOP.   Heme:H/H stable.D/c heparin gtt. Start eliquis 577mBID.  ID: C diff colitis. PR vanco d/c 12/18. Afebrile. BM x1 since rectal tube removal.  Endocrine: ISS  Dispo: Tele. Anticipating d/c by the end of this week. Will likely need SNF.    I attest that I had personally interviewed and examined Jerome Nieduring this encounter on 07/25/2016 and have reviewed all of the available diagnostic studies along with the resident physician.  I, Gentry FitzMD, agree with the recorded history, physical examination and evaluation above and have supervised formulation of the stated impression, recommendations and care plan.    CaGentry FitzMD  08/27/2019 8:42 PM

## 2016-07-25 NOTE — Interdisciplinary (Addendum)
Speech Therapy Progress Note/Treatment/ DC (For swallowing)     Start of Care: 07/23/16  Onset date : 07/20/16  Reason for referral: Cognitive Impairment;Swallow Impairment     Preferred [facility]    Progress Report  Patient attendance: Good (attended >75% of visits) Patient received 5/5 speech therapy sessions this week period.   Patient compliance with therapy program: Good  Treatment focus : Treatment of swallowing dysfunction and/or oral function for feeding;Treatment of speech, language, voice, communication and/or auditory processing disorder, individual  Symptoms have: Improved  Patient has made : Significant progress toward achievement of goals  Plan: Continue therapy per MD Rx for cognitive therapy. Patient does not require further SLP services for dysphagia.   Treatment today: Yes  Focus/Plan for next treatment  : 92507  Treatment of speech, language, voice, communication and/or auditory processing disorder, individual    Treatment Today  Treatment Today  92526  Tx of swallow dysfunction and/or oral function for feeding: Regular solids;Thin liquid  92507  Tx of speech, language, voice, communication and/or auditory processing disorder, individual : Abstract reasoning/problem solving;Orientation tasks;Receptive language tasks;Recall of biographical information;Expressive language tasks     Subjective  Contact Time: 1415  Treatment Type: dysphagia, cognitive  Subjective: Pleasant and cooperative   Pain Location: Denies pain   Cleared by RN for speech therapy.     Objective  Patient is currently receiving regular solid/thin liquid diet. Observed Pt with trials of pineapples and thin water via bottle. Oral phase unremarkable. Swallow initiation timely. Laryngeal excursion complete. No immediate or delayed s/sx of aspiration observed.  Patient able to engage in conversation with clinician and MD's without difficulty with good sustained attention. Pt was oriented x4 independently  and recalled what he ate for breakfast and lunch without difficulty. Pt stated he does not remember much from when he was in ICU. He completed verbal functional problem solving tasks at 100% accuracy. Pt required moderate assistance to complete deductive reasoning/functional math tasks which Pt states it's worse compared to baseline.     Assessment  Functional oropharyngeal swallow. Low aspiration risk. Recommend continue current diet. Further 1:1 skilled SLP intervention not indicated for swallowing. Patient presented with minimal cognitive deficits impacting his ability to complete high level cognitive tasks. SLP service will continue to follow for cognitive therapy.     Functional Limitation Reporting   G-Codes  Functional Assessment Tool Used: FCMs  Functional Limitations: Other Speech Language Pathology  Swallow Current Status [phone]): CH 0% impaired, limited or restricted  Swallow Goal Status [phone]): [place] 0% impaired  Swallow Discharge Status [phone]): CH 0% impaired, limited or restricted  Other Speech-Language Pathology Functional Limitation Current Status (Y0998): CI 1%-19% impaired, limited or restricted   Other Speech-Language Pathology Functional Limitation Goal Status (P3825): CI 1%-19% impaired, limited or restricted     Plan/Recommendations  Plan/Recommendations  Discussion and Agreement: Patient  Agreement to Plan of Care: Patient stated understanding and agreement;No family/caregiver present  Diet: Regular solids/thin liquids, oral medications as tolerated   Aspiration Precautions : Sit upright while eating and drinking  Additional Recommendations : Dietary consult;Other (Comment) (to determine supplementary nutrition as indicated)  Interdisciplinary Communication : Discussed with RN  At end of session, patient remained in bed   Individualized Plan For Next Session: High level functional problem solving       Plan  The goals listed below are achievable and realistic within the designated  time frame and the treatments listed below, and referred to in the treatment plan, are medically necessary  to achieve these goals. The functional goals were created based on the patient's prior level of function.    Goals                                                                                                         Progress  Long term goals  Swallow: Swallow: Pt will tolerate least restrictive oral diet without e/o laryngeal aspiration  Language/cognition : Improve cognitive communication skills to suport ADLs in home adn community settings Goal met for swallowing      Goal progressed for cognition    Short term goals (Swallow)   Goal: Pt will tolerate regular solids with thin liquids without symptoms of laryngeal penetration/aspiration  Visits : 5  Status : Met  Goal met    Short term goals (Language/Cognition)   Goal: Improve sustained attention to 15 minutes during various cognitive linguistic tasks  Visits : 5  Status : Met  Goal: Imrpove orientation to x 4 with mild cues  Visits : 5  Status : Met  Goal: Pt to complete simple problem sovling tasks with 80% accuracy  Visits : 5  Status : Met    By 08/01/2016  Goal: Pt will complete functional problem solving tasks involving time and money with 80% accuracy.   Visits: 3  Status: New     Goal met        Goal met       Goal met                                                                        Treatment Plan  Frequency: 3 times per week       Discussion and Agreement: Patient      Total Treatment Time (min): [place], MS, CCC-SLP

## 2016-07-26 ENCOUNTER — Telehealth: Payer: Self-pay | Admitting: Pain Medicine

## 2016-07-26 LAB — CBC WITH DIFF, BLOOD
ANC automated: 8.3 10*3/uL — ABNORMAL HIGH (ref 2.0–8.1)
Basophils %: 1.3 %
Basophils Absolute: 0.2 10*3/uL (ref 0.0–0.2)
Eosinophils %: 5.1 %
Eosinophils Absolute: 0.7 10*3/uL — ABNORMAL HIGH (ref 0.0–0.5)
Hematocrit: 27.3 % — ABNORMAL LOW (ref 39.5–50.0)
Hgb: 8.8 G/DL — ABNORMAL LOW (ref 13.5–16.9)
Lymphocytes %: 23.9 %
Lymphocytes Absolute: 3.1 10*3/uL (ref 0.9–3.3)
MCH: 31.7 PG (ref 27.0–33.5)
MCHC: 32.3 G/DL (ref 32.0–35.5)
MCV: 97.9 FL — ABNORMAL HIGH (ref 81.5–97.0)
MPV: 8.6 FL (ref 7.2–11.7)
Monocytes %: 7.1 %
Monocytes Absolute: 0.9 10*3/uL — ABNORMAL HIGH (ref 0.0–0.8)
Neutrophils % (A): 62.6 %
PLT Count: 663 10*3/uL — ABNORMAL HIGH (ref 150–400)
RBC: 2.79 10*6/uL — ABNORMAL LOW (ref 3.70–5.00)
RDW-CV: 16.9 % — ABNORMAL HIGH (ref 11.6–14.4)
White Bld Cell Count: 13.2 10*3/uL — ABNORMAL HIGH (ref 4.0–10.5)

## 2016-07-26 LAB — BASIC METABOLIC PANEL, BLOOD
BUN: 8 mg/dL (ref 7–25)
CO2: 26 mmol/L (ref 21–31)
Calcium: 8.7 mg/dL (ref 8.6–10.3)
Chloride: 106 mmol/L (ref 98–107)
Creat: 0.8 mg/dL (ref 0.7–1.3)
Electrolyte Balance: 7 mmol/L (ref 2–12)
Glucose: 112 mg/dL (ref 70–115)
Potassium: 3.9 mmol/L (ref 3.5–5.1)
Sodium: 139 mmol/L (ref 136–145)
eGFR - high estimate: >60 (ref >59)
eGFR - low estimate: >60 (ref >59)

## 2016-07-26 LAB — MAGNESIUM, BLOOD: Magnesium: 1.9 mg/dL (ref 1.9–2.7)

## 2016-07-26 LAB — PHOSPHORUS, BLOOD: Phosphorus: 3.4 MG/DL (ref 2.5–5.0)

## 2016-07-26 MED ORDER — OXYCODONE HCL 15 MG OR TABS
15.0000 mg | ORAL_TABLET | Freq: Four times a day (QID) | ORAL | Status: DC | PRN
Start: 2016-07-26 — End: 2016-07-26

## 2016-07-26 MED ORDER — TRAMADOL HCL 50 MG OR TABS
50.0000 mg | ORAL_TABLET | ORAL | Status: DC | PRN
Start: 2016-07-26 — End: 2016-07-27
  Administered 2016-07-26 – 2016-07-27 (×6): 50 mg via ORAL
  Filled 2016-07-26 (×6): qty 1

## 2016-07-26 MED ORDER — TRAMADOL HCL 50 MG OR TABS
50.0000 mg | ORAL_TABLET | ORAL | Status: DC | PRN
Start: 2016-07-26 — End: 2016-07-26

## 2016-07-26 MED ORDER — HYDROMORPHONE HCL 2 MG/ML IJ SOLN
0.4000 mg | INTRAMUSCULAR | Status: DC | PRN
Start: 2016-07-26 — End: 2016-07-27
  Administered 2016-07-26: 0.4 mg via INTRAVENOUS
  Administered 2016-07-26: 2 mg via INTRAVENOUS
  Administered 2016-07-26: 0.4 mg via INTRAVENOUS
  Administered 2016-07-26: 2 mg via INTRAVENOUS
  Filled 2016-07-26 (×3): qty 1

## 2016-07-26 MED ORDER — OXYCODONE HCL 15 MG OR TABS
15.0000 mg | ORAL_TABLET | Freq: Four times a day (QID) | ORAL | Status: DC | PRN
Start: 2016-07-26 — End: 2016-07-27
  Administered 2016-07-26 – 2016-07-27 (×6): 15 mg via ORAL
  Filled 2016-07-26 (×6): qty 1

## 2016-07-26 MED ORDER — GABAPENTIN 300 MG OR CAPS
300.0000 mg | ORAL_CAPSULE | Freq: Three times a day (TID) | ORAL | Status: DC
Start: 2016-07-26 — End: 2016-07-27
  Administered 2016-07-26 – 2016-07-27 (×5): 300 mg via ORAL
  Filled 2016-07-26 (×4): qty 1

## 2016-07-26 NOTE — Progress Notes (Signed)
[facility] NOTE       Current Hospital Stay:   20 days - Admitted on: 07/06/2016    72M with a traumatic LLE injury 3 y.a. presented with pain to LLE concerning for claudication.  S/p left popliteal to distal PT bypass  S/p bypass revision, left popliteal to peroneal and peroneal to PT jump graft  C.diff colitis, acute respiratory failure, sepsis, DT    07/06/16:  L pop to distal PTA bypass  L saphenous vein harvest  L pop a. angioplasty  L distal PTA angioplasty     07/07/16:  Redo L transinterosseous below-knee popliteal-peroneal and sequential peroneal-distal posterior tibal-medial tarsal reverse saphenous vein bypass   L distal segmental fibula resection to expose distal peroneal artery,  Thrombectomy of L below-knee popliteal-posterior tibial reverse saphenous vein bypass    Subjective:  NAOE. Has been ambulating and working w/ PT. Continues to have left foot pain w/ increased ambulation, controlled with pain medications. Tolerating diet. Denies F/C/N/V. Declined SNF and would like to go home. States that his wife will be taking care of him.      Objective:      Current Medications:   acetaminophen  650 mg Q6H    apixaban  5 mg Q12H    aspirin  81 mg Daily    folic acid  1 mg Daily    gabapentin  300 mg Q8H    influenza vaccine >=3 yrs  0.5 mL Prior to discharge    metoprolol tartrate  25 mg Q12H    thiamine  100 mg Daily    vancomycin  125 mg Q6H NR      dextrose-sodium chloride 5%-0.45% 10 mL/hr at 07/23/16 0700      HYDROmorphone  0.4 mg Q2H PRN    LORazepam  2 mg Q1H PRN    Or    LORazepam  4 mg Q1H PRN    metoprolol  5 mg Q4H PRN    ondansetron  4 mg Q6H PRN    oxyCODONE  15 mg Q6H PRN    oxyCODONE  5 mg Q6H PRN    QUEtiapine  150 mg Q12H PRN    sodium chloride (PF)  10 mL PRN    sodium chloride (PF)  20 mL PRN    traMADol  50 mg Q4H PRN       Review of Systems:   Nutrition: Diet Order Regular; Liquid Consistency: Thin   Gastrointestinal: Diarrhea   Mobility:  Progressive    Vital Signs:  Temperature:  [97.9 F (36.6 C)-98.4 F (36.9 C)] 97.9 F (36.6 C) (12/21 0727)  Blood pressure (BP): (125-140)/(83-102) 137/87 (12/21 0727)  Heart Rate:  [89-100] 100 (12/21 0727)  Respirations:  [16-18] 18 (12/21 0727)  Pain Score: 9 (12/21 0801)  O2 Device: None (Room air) (12/21 0513)  SpO2:  [95 %-100 %] 99 % (12/21 0727)  RASS Score: Alert and calm    Wt Readings from Last 1 Encounters:   07/23/16 97.5 kg (214 lb 15.2 oz)       Intake/Output (Current Shift):  12/20 0600 - 12/21 0559  In: 1832.5 [P.O.:1570; I.V.:262.5]  Out: 1425 [Urine:1425]      Intake/Output Summary (Last 24 hours) at 07/26/16 1026  Last data filed at 07/26/16 0801   Gross per 24 hour   Intake             1690 ml   Output  1500 ml   Net              190 ml       General: Well developed, well nourished [place] American male. Resting in bed. NAD.   Heart: RRR  Lungs: Breathing comfortably on RA.  Abdomen: Soft, decreased distention. Non-peritoneal.   Extremities:   - RLE: Dopplerable PT and DP signals  - LLE: Edematous (improving), wound without erythema. Intact. Sutures in place. Left toe with bluish discoloration. Dopplerable AT/DP signal.       Diagnostic Data:  Laboratory data:   Lab Results   Component Value Date    K 3.9 07/26/2016    CL 106 07/26/2016    BUN 8 07/26/2016    CREAT 0.8 07/26/2016    GLU 112 07/26/2016    [place] 8.7 07/26/2016     Lab Results   Component Value Date    HGB 8.8 (L) 07/26/2016    HCT 27.3 (L) 07/26/2016     Lab Results   Component Value Date    HGB 8.8 (L) 07/26/2016    HCT 27.3 (L) 07/26/2016     No results found for: AST, ALT, ALK, TBILI, DBILI, TP, ALB  Lab Results   Component Value Date    PTT 89.6 (H) 07/25/2016         ASSESSMENT / PLAN:  52 year oldmale admitted with 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 Patient underwent attempted popliteal-posterior tibial artery bypass on 12/1 with  resulting graft failure w/ reoperation on 12/2 and underwent popliteal to peroneal bypass with jump graft from peroneal artery to posterior tibial artery. His post-op course was complicated by acute ETOH withdrawal d/t undisclosed ETOH abuse as well as c.diff colitis. Currently doing very well.      Neuro: Continue PO medications. IV for breakthrough only. Continue gabapentin 367m TID. Add tramadol 556mQ4H PRN.  CV: Tachycardic in the setting of infection and alcoholic withdrawal. Now improving/resolved, 80-90s mostly.  Pulm: H. Flu 3+ in sputum, but w/o focal consolidation. Deferring tx for now.   GI: Regular diet.   Renal: Normal creatinine. Replete lytes PRN. Good UOP.   Heme:H/H stable. Continue eliquis 44m24mID.  ID: C diff colitis. Continue PO vanco for a total of 14-day course (ending 12/25). Afebrile. WBC down-trending 19.3 -> 13.2.  Endocrine: ISS  Dispo: Tele. Anticipating d/c by the end of this week.     I attest that I had personally interviewed and examined Jerome Hunt this encounter and have reviewed all of the available diagnostic studies along with the resident physician.  I, IHilma FavorsD, agree with the recorded history, physical examination and evaluation above and have supervised formulation of the stated impression, recommendations and care plan.    IsaHilma FavorsD  07/26/2016 1500

## 2016-07-26 NOTE — Telephone Encounter (Signed)
Received call from patient requesting PT order. Patient was last seen by Dr. Hubert Azureayal in [place] on 06/11/16. Patient has HMO plan.

## 2016-07-26 NOTE — Plan of Care (Signed)
Problem: Promotion of health and safety  Goal: Promotion of Health and Safety  The patient remains safe, receives appropriate treatment and achieves optimal outcomes (physically, psychosocially, and spiritually) within the limitations of the disease process by discharge.   Outcome: Progressing toward goal, anticipate improvement over: Next 24-48 hours   07/26/16 1818   Adult/Peds Plan of Care   Standard of Care/Policy Telemetry SOC;Skin SOC;Falls reduction   Outcome Evaluation (rationale for progressing/not progessing Oxy,ultram&tylenol NOT giving adequate pain relief pt requiring dilaudid IV for breakthrough pain.    Patient Specific Goal, Promotion of Health & Safety Encouraged to alternate oxy&ultram &try to wean off on IV dilaudid since pt will be d/c'd home soon, if tolerating PO pain meds.   Additional Individualized Interventions/Recommendations  Encouraged to use IS 10x/hr w/a. To get OOB with all meals.   Additional Individualized Interventions/Recommendations Continue with spores precautions until 48hrs of solid/formed stool   Nursing Shift Summary    No data found.

## 2016-07-26 NOTE — Interdisciplinary (Signed)
Physical Therapy Daily Follow up Note        Current Functional Level  [place] AM-PAC: Basic Mobility  Assistance Needed to Turn from Back to Side While in a Flat Bed Without Using Bedrails: A lot (mod/max assist)  Difficulty with Supine to Sit Transfer: A lot (mod/max assist)  How Much Help Needed to Move to/from Bed to Chair: Total (dependent)  Difficulty with Sit to Stand Transfer from Chair with Arms: A lot (mod/max assist)  How Much Help Needed to Walk in Room: Total (dependent)  How Much Help Needed to Climb 3-5 Steps with a Rail: Total (dependent)  AMPAC Total Score: 9  Assessment: AM-PAC Basic Mobility - Current Functional Impairment Level: CL: 60-79% impaired   Post Acute Discharge Considerations  Anticipated required level of cognitive assistance: Some of the time  Anticipated required level of safety assistance: Occasional close supervision  Anticipated required level of physical assistance: One person assist;Most of the time  Anticipated physical barriers impacting discharge: Stairs/steps  Anticipated  level of therapy tolerance and ongoing therapy needs: Would benefit from intermittent Physical Therapy intervention post acute discharge to maximize functional independence  Equipment recommendations : Front Wheeled Soil scientistWalker  Walker Justification Statement: Patient safety and mobility is improved or enhanced by the use of a walker    Admit Date: 07/06/2016         Preferred Language:English             PT Progress Report       07/26/16 1100    Assessment     Assessment  Pt. improved safety and balance with therapy.  Pt. demo LLE stiffness, required mod cues for upright posture and proper L foot step length during gait.  No LOB and no c/o pain during gait.  Pt. left sitting in chair with wife present.  RN  Cathie aware.    Rehab Potential Good      07/26/16 1100    Inpatient Treatment Plan    Frequency of treatment 5 times per week      07/26/16 1100    Treatment Plan Discussion     Treatment Plan Discussion & Agreement Patient;Spouse/partner      07/26/16 1100    Patient/Family Education    Learner(s) Patient    Patient/family training in appropriate therapeutic interventions Ongoing    Education Topic(s) Benefits of out of bed to chair      07/26/16 1100    Subjective    Status since last treatment  Pt. agreeable to therapy.      07/26/16 1100    Numeric Pain Rating Scale     Pain Intensity - rating at present 0    Pain Intensity - rating at worst  0    Pain Intensity- rating after treatment 0      07/26/16 1100    Inpatient Pain Assessment    Patient status Patient agreeable to treatment    Other Pain Tools Numeric Pain Rating Scale      07/26/16 1100    Inpatient Precautions/Contraindications    Precautions Fall    Fall Socks/charm    Isolation Contact      07/26/16 1100    Inpatient Patient Safety Considerations    Patient call light within reach Yes    Patient left sitting  In a chair    Patient requires for safe ambulation Front wheeled walker    The patient may be at risk for falls Yes- Notified Nursing    Family/caregiver present  for treatment Spouse/Significant other      07/26/16 1100    Treatment provided today    Payor  Medicare/Private      07/26/16 1100    Therapeutic Procedures    Gait Training ([id number]) Assistive device training;Patient education       Total TIMED Treatment (min)  15    Therapeutic Activities (97530)  Functional activities;Transfer training with weight shift and direction change;Weight shift activities to improve safety in unsupported sitting or standing       Total TIMED Treatment (min)  15    Therapeutic exercise  (97110)  Flexibility exercises       Total TIMED Treatment (min)  15      Therapeutic exercise   ankle pumps, ankle dorsiflexion, hamstring stretching.      07/26/16 1100    Focus for Next Treatment    Focus for Next Treatment Gait training;Therapeutic exercise;Therapeutic activities      07/26/16 1100    Treatment Time      Treatment start time 0945    Total TIMED Treatment  (min) 45    Total Treatment Time (min) 45            PT Functional       07/26/16 1100    Functional Mobility - Detailed    Patient Participation in Mobilization Activities Patient agreeable to participate in mobility/or out of bed activities    Rolling Assistance level    Assistance Level Supervised    Supine to Sit Assistance level    Assistance Level Supervised    Sit to Supine Assistance level    Sit to Stand Assistance level    Assistance Level Minimum assistance (25% assistance)    Stand to Sit Assistance level    Assistance Level Minimum assistance (25% assistance)    Transfers Bed to/from Chair Assistance level;Device used    Assistance Level Minimum assistance (25% assistance)    Device Used Front wheeled walker      07/26/16 1100    Gait    Gait Assessment Gait limitations and deviations present    Assistance Level Minimum assistance (25% assistance)    Distance Ambulated Today 350 ft    Terrain Management Level surfaces              PT Cervical Thoricic C/T Spine       07/26/16 1100    Gait    Gait Assessment Gait limitations and deviations present    Assistance Level Minimum assistance (25% assistance)    Assistive Device Used Front wheeled walker    Distance Ambulated Today 350 ft    Terrain Management Level surfaces

## 2016-07-26 NOTE — Interdisciplinary (Addendum)
NUTRITION NOTE    Patient Name:  Jerome Hunt  MRN: [id number]  Date of Birth: 09/18/1975  Age: 40 year old  Date of Admission:  07/06/2016    Service Date: July 26, 2016  Evaluation Type: Progress  Source of Referral: Routine F/U     Recommendations to Physicians:  1. Continue current nutrition regimen  2. Add MVI, cont folic acid and thiamine      Jerome Hunt is a 40 year old male, admitted on 120117 with a diagnosis of has Peripheral artery disease (CMS-HCC); Peripheral vascular disease (CMS-HCC); Clostridium difficile colitis; Alcoholism /alcohol abuse (CMS-HCC); and Alcohol withdrawal delirium, acute, hyperactive (CMS-HCC) on his problem list..     Per MD Progress Note  39 year oldmale admitted with 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 Patient underwent attempted popliteal-posterior tibial artery bypass on 12/1 with resulting graft failure w/ reoperation on 12/2 and underwent popliteal to peroneal bypass with jump graft from peroneal artery to posterior tibial artery. His post-op course was complicated by acute ETOH withdrawal d/t undisclosed ETOH abuse as well as c.diff colitis. Currently doing very well.    Subjective: pt has appetite, c/o limited food, does not have menu to order but has been using room service.     Objective:  Nutrition Related Findings  Nutrition Subjective : Spoke with patient;Spoke with family  Current Diet order: Regular   Nutrition Intake: 50-75%  Bowel Function: Normal  Lab Review: reviewed  Medication Review: Vitamins/Minerals (folic acid, thiamine)  No results found for: A1C  No results found for: PREALB  No results found for: CRP  No results found for: VITD25HYDROX, VD2, VD3, VDT  Lab Results   Component Value Date    CHOL 237 05/05/2016    LDLCALC 152 05/05/2016    TRIG 139 05/05/2016       I/O:   Intake/Output Summary (Last 24 hours) at 07/26/16 1340  Last data filed at 07/26/16 1136   Gross per 24 hour    Intake             1810 ml   Output             1500 ml   Net              310 ml       Allergies: No known allergies [other]    Anthropometrics:  Ht Readings from Last 1 Encounters:   07/06/16 6\' 3"  (1.905 m)        Wt Readings from Last 1 Encounters:   07/23/16 97.5 kg (214 lb 15.2 oz)      Body mass index is 26.87 kg/(m^2).    Estimated Needs  Weight Used for Total Estimated Needs (kg): 97.5 kg  Min Total Caloric Estimated Needs (kcals/day): 2437.5  Max Total Caloric Estimated Needs (kcals/day): 2925  Min Total Protein Estimated Needs (g/day): 117  Max Total Protein Estimated Needs (g/day): 146.25     Max Total Fluid Estimated Needs (ml/day)  : 2925      Interventions by RD  Interventions : Assisted patient with meal ordering (provided menu and discussed room service)    Nutrition Diagnosis   Nutrition Diagnosis 1: Increased Nutrient Needs  Related To: Increased nutrient needs due to catabolic illness  As Evidenced By: Conditions associated with medical Dx/Tx  Status: Ongoing  Nutrition Diagnosis 2: Inadequate oral intake  Related To: Changes - taste/appetite/preference  As Evidenced By: Documented nutrition  intake  Status: Ongoing (improving)    Nutrition Monitoring and Evaluation  Nutrition Goals  Meet >75% of estimated needs: Progressing    Follow up date: 08/02/16  Nutrition Risk Level: Mod    Name: Harrel CarinaJie Khya Halls, RD     Date: 07/26/2016    Time: 1:40 PM

## 2016-07-26 NOTE — Plan of Care (Signed)
Problem: Promotion of health and safety  Goal: Promotion of Health and Safety  The patient remains safe, receives appropriate treatment and achieves optimal outcomes (physically, psychosocially, and spiritually) within the limitations of the disease process by discharge.   Outcome: Progressing toward goal, anticipate improvement over: next 12-24 hours   07/26/16 [id number]   Adult/Peds Plan of Care   Standard of Care/Policy Medical/Surgical/SOC;Skin SOC;Falls reduction   Outcome Evaluation (rationale for progressing/not progressing Pt pain was managed. When he did activity pain would increase.    Patient Specific Goal, Promotion of Health & Safety Encourage IS.   Additional Individualized Interventions/Recommendations  Vascular check 2qhr.   Additional Individualized Interventions/Recommendations Neuro check 2qhr.

## 2016-07-26 NOTE — Telephone Encounter (Signed)
Not indication that Dr Hubert Azureayal recommends PT at this time. Pt just had vascular surgery and I advised he discuss PT/ rehab during recovery with his surgeon. Pt verbalized understanding. No further action needed at this time.

## 2016-07-26 NOTE — Interdisciplinary (Signed)
07/26/16 1423   Discharge Facilitator   Discharge Facilitator Comments Order for home health and DME were faxed to [facility] coordinator, Cynthis [phone]. Requested her to call with the name of the agency that will see the patient. She is aware that patient is going home tomorrow.

## 2016-07-27 ENCOUNTER — Inpatient Hospital Stay: Payer: No Typology Code available for payment source

## 2016-07-27 DIAGNOSIS — I739 Peripheral vascular disease, unspecified: Secondary | ICD-10-CM

## 2016-07-27 LAB — BASIC METABOLIC PANEL, BLOOD
BUN: 9 mg/dL (ref 7–25)
CO2: 27 mmol/L (ref 21–31)
Calcium: 9.3 mg/dL (ref 8.6–10.3)
Chloride: 102 mmol/L (ref 98–107)
Creat: 0.8 mg/dL (ref 0.7–1.3)
Electrolyte Balance: 8 mmol/L (ref 2–12)
Glucose: 106 mg/dL (ref 70–115)
Potassium: 3.9 mmol/L (ref 3.5–5.1)
Sodium: 137 mmol/L (ref 136–145)
eGFR - high estimate: >60 (ref >59)
eGFR - low estimate: >60 (ref >59)

## 2016-07-27 LAB — CBC WITH DIFF, BLOOD
ANC automated: 9.1 10*3/uL — ABNORMAL HIGH (ref 2.0–8.1)
Basophils %: 0.3 %
Basophils Absolute: 0 10*3/uL (ref 0.0–0.2)
Eosinophils %: 4.5 %
Eosinophils Absolute: 0.7 10*3/uL — ABNORMAL HIGH (ref 0.0–0.5)
Hematocrit: 30 % — ABNORMAL LOW (ref 39.5–50.0)
Hgb: 9.7 G/DL — ABNORMAL LOW (ref 13.5–16.9)
Lymphocytes %: 26.6 %
Lymphocytes Absolute: 4 10*3/uL — ABNORMAL HIGH (ref 0.9–3.3)
MCH: 31.2 PG (ref 27.0–33.5)
MCHC: 32.4 G/DL (ref 32.0–35.5)
MCV: 96.3 FL (ref 81.5–97.0)
MPV: 9 FL (ref 7.2–11.7)
Monocytes %: 7.4 %
Monocytes Absolute: 1.1 10*3/uL — ABNORMAL HIGH (ref 0.0–0.8)
Neutrophils % (A): 61.2 %
PLT Count: 687 10*3/uL — ABNORMAL HIGH (ref 150–400)
RBC: 3.12 10*6/uL — ABNORMAL LOW (ref 3.70–5.00)
RDW-CV: 17.2 % — ABNORMAL HIGH (ref 11.6–14.4)
White Bld Cell Count: 14.9 10*3/uL — ABNORMAL HIGH (ref 4.0–10.5)

## 2016-07-27 LAB — MAGNESIUM, BLOOD: Magnesium: 2 mg/dL (ref 1.9–2.7)

## 2016-07-27 LAB — PHOSPHORUS, BLOOD: Phosphorus: 3.6 MG/DL (ref 2.5–5.0)

## 2016-07-27 MED ORDER — GABAPENTIN 300 MG OR CAPS
300.0000 mg | ORAL_CAPSULE | Freq: Three times a day (TID) | ORAL | 0 refills | Status: DC
Start: 2016-07-27 — End: 2016-08-08

## 2016-07-27 MED ORDER — VANCOMYCIN 50 MG/ML ORAL SOLN (COMPOUNDED) (~~LOC~~)
125.00 mg | Freq: Four times a day (QID) | ORAL | 0 refills | Status: AC
Start: 2016-07-27 — End: 2016-07-30

## 2016-07-27 MED ORDER — OXYCODONE HCL 15 MG OR TABS
15.0000 mg | ORAL_TABLET | Freq: Four times a day (QID) | ORAL | 0 refills | Status: DC | PRN
Start: 2016-07-27 — End: 2016-07-28

## 2016-07-27 MED ORDER — TRAMADOL HCL 50 MG OR TABS
50.00 mg | ORAL_TABLET | ORAL | 0 refills | Status: AC | PRN
Start: 2016-07-27 — End: ?

## 2016-07-27 MED ORDER — METOPROLOL TARTRATE 25 MG OR TABS
25.0000 mg | ORAL_TABLET | Freq: Two times a day (BID) | ORAL | 0 refills | Status: DC
Start: 2016-07-27 — End: 2016-09-12

## 2016-07-27 MED ORDER — APIXABAN 5 MG PO TABS
5.0000 mg | ORAL_TABLET | Freq: Two times a day (BID) | ORAL | 0 refills | Status: DC
Start: 2016-07-27 — End: 2016-08-08

## 2016-07-27 NOTE — Interdisciplinary (Signed)
Speech Therapy Daily Treatment Note    Start of Care: 07/23/16  Onset date : 07/20/16  Reason for referral: Cognitive Impairment     Preferred Language:English      Subjective   Subjective   Contact Time: 1350  Treatment type : Cognitive  Subjective : Agreeable to treatment;Awake/alert;Pt in chair at bedside  Pain Rating (0-10): 0    Objective  Objective : Pt answered 5/5 orientation questions accurately. Pt was able to name the past 4 [place] Presidents. Pt recalled his biographical history accurately. Pt gave a detailed explanation of his reason for admission, hospital course, surgery, and other details independently. Pt solved basic calculation problems accurately in 10/10 trials. Pt stated he was disoriented/agitated on admission, but has been lucid this week. Pt gave appropriate solutions to functional problems in 5/5 trials.     Treatment Today  [phone]  Tx of speech, language, voice, communication and/or auditory processing disorder, individual : Abstract reasoning/problem solving;Orientation tasks  [phone] Tx cognitive skills to improve attention, memory, problem solving, : Problem solving;Organizational thought tasks;Recall of biographical information     Assessment              Assessment : Pt has met all goals and is appropriate for DC from skilled SLP services. No further outpatient cognitive linguistic therapy is indicated for after DC from acute.    Plan/Recommendations  Plan/Recommendations  Discussion and Agreement: Patient  Agreement to Plan of Care: Patient stated understanding and agreement  Family Present for Treatment: None present  Aspiration Precautions : Sit upright while eating and drinking  Additional Recommendations : Dietary consult;Other (Comment) (to determine supplementary nutrition as indicated)  Interdisciplinary Communication : Discussed with RN Verdis Frederickson)      Plan  Patient to continue therapy for Diagnosis : Cognitive Communication Deficit       Education   Topic of education: Recommendations after DC  Person(s) educated: patient  Mode of education: verbal, demonstration  Response to education: returned understanding        Goals                                                                                                Progress   Long term goals  Swallow: Swallow: Pt will tolerate least restrictive oral diet without e/o laryngeal aspiration  Language/cognition : Improve cognitive communication skills to suport ADLs in home adn community settings    Short term goals (Swallow)   Goal: Pt will tolerate regular solids with thin liquids without symptoms of laryngeal penetration/aspiration  Visits : 5  Status : Met                 Short term goals (Language/Cognition)   Goal: Improve sustained attention to 15 minutes during various cognitive linguistic tasks  Visits : 3  Status : Met  Goal: Imrpove orientation to x 4 with mild cues  Visits : 3  Status : Met  Goal: Pt to complete simple problem sovling tasks with 80% accuracy  Visits : 3  Status : Met     Patient stated goal: "It's there and there  for goals"          Pt was left with call button within reach.     Total Treatment Time (min): 35

## 2016-07-27 NOTE — Interdisciplinary (Signed)
07/27/16 1010   Discharge Facilitator   Discharge Facilitator Comments Per Aram Beechamynthia of [place], she is waiting for confirmation from [facility] if they could take the case and the FWW  will be delivered to the hospital from [facility], [phone].

## 2016-07-27 NOTE — Interdisciplinary (Signed)
 Physical Therapy Discharge Summary Note        Current Functional Level  [place] AM-PAC: Basic Mobility  Assistance Needed to Turn from Back to Side While in a Flat Bed Without Using Bedrails: None (independent)  Difficulty with Supine to Sit Transfer: None (independent)  How Much Help Needed to Move to/from Bed to Chair: None (independent)  Difficulty with Sit to Stand Transfer from Chair with Arms: None (independent)  How Much Help Needed to Walk in Room: None (independent)  How Much Help Needed to Climb 3-5 Steps with a Rail: A little (supervised/min assist)  AMPAC Total Score: 23  Assessment: AM-PAC Basic Mobility - Current Functional Impairment Level: CI: 1-19% impaired   Post Acute Discharge Considerations  Anticipated required level of cognitive assistance: None  Anticipated required level of safety assistance: Occasional close supervision  Anticipated required level of physical assistance: Some of the time  Anticipated physical barriers impacting discharge: Stairs/steps  Anticipated  level of therapy tolerance and ongoing therapy needs: Outpatient  Equipment recommendations : Front Wheeled Lawyer Justification Statement: Patient safety and mobility is improved or enhanced by the use of a walker    Admit Date: 07/06/2016         Preferred [place]    Plan  Patient to continue therapy for     ICD-10-CM ICD-9-CM    1. Peripheral vascular disease (CMS-HCC) I73.9 443.9 Case Request: Aortogram, selective left lower extremity angiogram, possible percutaneous intervention, possible open bypass revision, possible greater saphenous vein harvest best available site      Case Request: Aortogram, selective left lower extremity angiogram, possible percutaneous intervention, possible open bypass revision, possible greater saphenous vein harvest best available site      Pathology - Tissue Exam      Home Physical Therapy Evaluation      Home Occupational Therapy Evaluation       Walker Folding Adjustable      CANCELED: Pathology - Tissue Exam   2. Dysphagia, unspecified type R13.10 787.20    3. Impaired mobility and ADLs Z74.09 799.89 Home Physical Therapy Evaluation      Home Occupational Therapy Evaluation   4. Cognitive communication deficit R41.841 799.52    5. Mobility impaired Z74.09 799.89 Home Physical Therapy Evaluation      Home Occupational Therapy Evaluation   6. Muscle weakness of extremity M62.81 728.87 Home Physical Therapy Evaluation      Home Occupational Therapy Evaluation   7. Peripheral artery disease (CMS-HCC) I73.9 443.9 Home Physical Therapy Evaluation      Home Occupational Therapy Evaluation       Patient stated goal: To go home.   Therapy Goals                                                                                        Current Level Goals for Episode of Care    Functional Deficit Impaired bed mobility, transfers, gait, and activity tolerance.   Long term Functional Goal  Functional deficit summary: Impaired bed mobility, transfers, gait, and activity tolerance.  Goal (Long Term) : Pt will be supervised w/ bed mobility, transfers, and household ambulation using FWW.  Goal status: New   Impairment #1 Functional Mobility Short Term Goal #1 Impairment: Functional Mobility  Custom goal: Pt will be able to perform rolling and supine to/from sit with min A using bedrails.   Goal status: New   Impairment #2 Functional Mobility Short Term Goal #2 Impairment : Functional Mobility  Custom goal: Pt will be able to perform stand pivot transfers using FWW w/ mod A.   Goal status: New   Impairment #3 Gait Short Term Goal #3 Impairment : Gait  Custom goal: Pt will be able to ambulate 30 ft using FWW w/ mod A.   Goal status: New         Functional Limitation Reporting    Rationale: Acceptable functional assessment;Clinical judgement;Patient history and medical status  Functional Assessment Tool: AM-PAC- 6 Clicks Basic Mobility Inpatient  Visit type: Discharge   Impairment Category - One time only: Mobility  Mobility: Walking and Moving Around Current Status (V0350): CI 1%-19% impaired, limited or restricted  Mobility: Walking and Moving Around Goal Status [phone]): CI 1%-19% impaired, limited or restricted  Mobility: Walking and Moving Around Discharge Status [phone]): CI 1%-19% impaired, limited or restricted  Functional Assessment Tool  Rationale: Acceptable functional assessment;Clinical judgement;Patient history and medical status  Functional Assessment Tool: AM-PAC- 6 Clicks Basic Mobility Inpatient  Assessment: AM-PAC Basic Mobility - Current Functional Impairment Level: CI: 1-19% impaired                PT Progress Report       07/27/16 1400    Assessment     Assessment  Patient tolerates well and meets functional goals.  He was encouraged to ambulate 3 times a day with nursing staff assist.  FWW was delivered to his room.  Will discharge from skilled PT services.       07/27/16 1400    Inpatient Treatment Plan    Frequency of treatment One time only, further treatment not indicated    Status of treatment Patient appropriate for discharge from therapy      07/27/16 1400    Patient/Family Education    Learner(s) Patient    Patient/family training in appropriate therapeutic interventions Completed this visit    Education Topic(s) Mobility;DME use and maintenance      07/27/16 1400    Subjective    Status since last treatment  Patient received supine in bed.       07/27/16 1400    Numeric Pain Rating Scale     Pain Intensity - rating at present 0    Pain Intensity - rating at worst  2    Pain Intensity- rating after treatment 2    Frequency  Intermittent    Description  Throbbing   in dependent position    Location  LLE      07/27/16 1400    Inpatient Pain Assessment    Patient status Patient agreeable to treatment    Patient premedicated prior to treatment with Oral medication    Other Pain Tools Numeric Pain Rating Scale      07/27/16 1400    Activity Restrictions     Activity Restrictions None      07/27/16 1400    Inpatient Precautions/Contraindications    Precautions Fall    Fall Socks/charm    Isolation Contact    Lines/Drains/Airways Telemetry      07/27/16 1400    Inpatient Patient Safety Considerations    Patient call light within reach Yes    Patient left  sitting  In a chair on a pillow/cushion    Patient instructed/Nursing informed pt to be sitting no longer than as tolerated     Patient requires for safe ambulation Front wheeled walker    The patient may be at risk for falls Yes- Notified Nursing    Family/caregiver present for treatment None Present      07/27/16 1400    Therapy Plan Communication    Therapy Plan Communication Discussed therapy plan with Physician      07/27/16 1400    Physical Therapy Patient Discharge Instructions    Your Physical Therapist suggests the following Continue to use your assistive device as instructed when walking to improve your stability and prevent falls      07/27/16 1400    Treatment provided today    Payor  Medicare/Private      07/27/16 1400    Therapeutic Procedures    Gait Training [phone]) Assistive device training;Stair/curb/obstacle navigation training       Total TIMED Treatment (min)  30    Therapeutic Activities (43329)  Functional activities;Transfer training with weight shift and direction change;Weight shift activities to improve safety in unsupported sitting or standing       Total TIMED Treatment (min)  15      07/27/16 1400    Functional Limitation Reporting     Rationale Acceptable functional assessment;Clinical judgement;Patient history and medical status    Functional Assessment Tool AM-PAC- 6 Clicks Basic Mobility Inpatient    Visit type Discharge    Impairment Category - One time only Mobility    Mobility: Walking and Moving Around Current Status (J1884) CI    Mobility: Walking and Moving Around Goal Status (Z6606) CI    Mobility: Walking and Moving Around Discharge Status [phone]) CI      07/27/16 1400     Treatment Time     Treatment start time 1115    Total TIMED Treatment  (min) 45    Total Treatment Time (min) 45              PT Functional       07/27/16 1400    Functional Mobility - Detailed    Patient Participation in Mobilization Activities Patient agreeable to participate in mobility/or out of bed activities    Supine to [place] to [place] to [place] level    Assistance Level Modified independent    Stand to [place] level    Assistance Level Supervised    Transfers Bed to/from [place] level;Device used    [place]  wheeled walker      07/27/16 1400    Gait    Gait Assessment Gait limitations and deviations present    Assistance Level Supervised    Assistive Device Used Front wheeled walker    Distance Ambulated Today 200 ft    Terrain Management Level surfaces    Stair/Step Management No restrictions - safe to ascend/descend stairs/steps independently    Assistance Level Independent    Number of Steps Navigated Today 6                PT Cervical Thoricic C/T Spine       07/27/16 1400    Gait    Gait Assessment Gait limitations and deviations present    Assistance Level Supervised  Assistive Device Used Front wheeled Lexicographer Ambulated Today 200 ft    Terrain Management Level surfaces    Stair/Step Management No restrictions - safe to ascend/descend stairs/steps independently    Assistance Level Independent    Number of Steps Navigated Today 6                                      PT Discharge with Treatment Report       07/27/16 1400    Inpatient Therapy Overview    Reason for Discharge  Goals met no further skilled treatment indicated    Discharge Destination Home    Response to therapy Excellent    Patient compliance with therapy program Excellent      07/27/16 1400    Physical Therapy Patient Discharge Instructions     Your Physical Therapist suggests the following Continue to use your assistive device as instructed when walking to improve your stability and prevent falls

## 2016-07-27 NOTE — Discharge Instructions (Signed)
Discharge Instructions:   ?   Medications:   ?   - Please take all of your medications as prescribed to you.  - If you're taking narcotics for pain, please do not drive or operate machinery while on these medications.   - If you're taking Tylenol, please do not take Norco or Percocet at the same time as it may lead to liver damage. Only take one or the other, NOT both.   ?   Wound care:   - You may shower daily. Do not tub bathe. No swimming.   - Allow soapy water to run over the incisions. Do not scrub   - Pat dry with a towel, do not rub  - We will evaluate your wound at your next clinic visit appointment and will let you know when it would be appropriate to remove your sutures.   ?   Diet:   - Please follow your previously instructed post operative diet plan   - Drink plenty of fluids   - If you experience constipation, please take a stool softener or laxative to help aid in your bowel movements   ?   Activity:   - Avoid lifting more than 10 to 15 pounds until seen in your follow up appointment   - No driving while taking narcotic pain medication   - Please walk as often as you are able to - at least approximately 30 minutes a day.   - Do the coughing and breathing exercises you were taught in the hospital. If you were given an incentive spirometer to help with breathing, use it as directed. This is important and will help prevent lung infections.   - Ask your healthcare provider when you can return to work at your follow up appointment   ?   Follow up:   - Please attend your post operative follow up appointment if it has not been already scheduled.   - If it has not been scheduled yet, please call the clinic and schedule a post operative follow up appointment in 1 to 2 weeks.   ?   Please call the hospital or clinic and ask for the Vascular surgeon on call OR go to the emergency room if you experience:   ?   - Fevers greater than 100.35F or chills   - Increased pain, redness, swelling, bleeding, or  foul-smelling drainage at the incision site   - Incision separates or comes apart   - Increasing nausea and/or vomiting   - Worsening abdominal pain or isn't relieved by pain medicine   - Inability to have a bowel movement or pass gas in 3 days   - Dark-colored or bloody urine   - Bright red or dark black stools   - New onset of chest pain or shortness of breath

## 2016-07-27 NOTE — Interdisciplinary (Signed)
PT Contact       07/27/16 1007    Therapy Contact Note    Contact Time 1007    Therapy not provided at this time as Patient unavailable for treatment as off ward or involved in other procedure    Additional Comments Per RN, patient [place] currently in transit for [place].  Will follow up as able.

## 2016-07-27 NOTE — Interdisciplinary (Signed)
Care Management Assessment, Adult        Patient Name:  Jerome Hunt   MRN: [id number]   Date of Birth: 12-18-1975    Age: 40 year old   Date of Admission:  07/06/2016         Service Date: July 27, 2016     Assessment  Assessment Type: Discharge                     Discharge Planning  Type of Residence: Private residence  Home Care Services: Yes (home health care through [facility] has been arranged by [facility])  Anticipated Supplies/Services :  (DME - FWW has been delivered to bedside by [facility])     Case discussed w vascular surgery staff this am. They have confirmed readiness for discharge.  Previously filed orders for home health, dme are implemented. Staff updated                   If Medicare or Medicare Managed Care: Important Message from Medicare Given  If Medicare or Medicare Managed Care: Important Message from Medicare Given: Not Applicable         Clementeen GrahamJean M. Markel Kurtenbach, RN     Date: 07/27/2016    Time: 12:17 PM

## 2016-07-28 MED ORDER — OXYCODONE HCL 5 MG OR TABS
15.0000 mg | ORAL_TABLET | ORAL | 0 refills | Status: DC | PRN
Start: 2016-07-28 — End: 2016-07-28

## 2016-07-28 MED ORDER — OXYCODONE HCL 10 MG OR TABS
10.0000 mg | ORAL_TABLET | Freq: Four times a day (QID) | ORAL | 0 refills | Status: DC | PRN
Start: 2016-07-28 — End: 2016-08-08

## 2016-08-01 ENCOUNTER — Telehealth: Payer: Self-pay | Admitting: Vascular & Interventional Radiology

## 2016-08-01 ENCOUNTER — Telehealth: Payer: Self-pay | Admitting: Pain Medicine

## 2016-08-01 NOTE — Telephone Encounter (Signed)
Received call from patient requesting to get post surgery Rx refill either tramadol or oxycodone as soon as possible. Patient has follow up appointment on 08/28/15.

## 2016-08-01 NOTE — Telephone Encounter (Signed)
Spoke with patient and forwarded message to prescribing resident MD. Patient states he is taking 1.5 tabs of oxycodone around the clock Q6hours. Instructed we may not be able to refill over the phone, awaiting instructions from MD.

## 2016-08-01 NOTE — Telephone Encounter (Signed)
Pt called to requesting RX on his Oxycodone and Tramadol Transferred call to Nurse Pam Thank you

## 2016-08-02 ENCOUNTER — Telehealth: Payer: Self-pay | Admitting: Vascular & Interventional Radiology

## 2016-08-02 NOTE — Telephone Encounter (Signed)
Patient is calling to get the status of his requested refill on the Tramadol 50mg  his pharmacy is the [facility] on file patient would also like to know if there is a higher strength than 50mg  and if so is requesting the higher strength Patient states his pain level is at 10  Please assist and call him back to advise as soon as possible  Please note patient says he is off the Hydrocodone and is okay with not taking it but the Tramadol really helps him so it is the only refill he is requesting

## 2016-08-02 NOTE — Telephone Encounter (Addendum)
These meds have not been prescribed by Dr Hubert Azureayal in past. [place] able [place] be [place] in [place], he has next available appt on 08/28/15 with Dr Hubert Azureayal. Jerome DownsAdvised to call surgeon's office for pain meds until his next appt. Pt verbalized understanding. No further action needed at this time.

## 2016-08-03 NOTE — Telephone Encounter (Signed)
Pt is calling to follow up re pain medication and blood thinners and [facility]

## 2016-08-03 NOTE — Telephone Encounter (Signed)
Spoke with patient, has pain management appt on 1/22 and post op appt on 1/3- MD states he will not fill a new pain rx at this time and would like to defer to pain management. Patient made aware and understands.

## 2016-08-08 ENCOUNTER — Ambulatory Visit
Payer: No Typology Code available for payment source | Attending: Vascular & Interventional Radiology | Admitting: Vascular & Interventional Radiology

## 2016-08-08 ENCOUNTER — Encounter: Payer: Self-pay | Admitting: Vascular & Interventional Radiology

## 2016-08-08 VITALS — BP 152/88 | HR 125 | Temp 98.3°F | Resp 14 | Ht 75.0 in | Wt 210.3 lb

## 2016-08-08 DIAGNOSIS — I739 Peripheral vascular disease, unspecified: Secondary | ICD-10-CM | POA: Insufficient documentation

## 2016-08-08 DIAGNOSIS — A0472 Enterocolitis due to Clostridium difficile, not specified as recurrent: Principal | ICD-10-CM | POA: Insufficient documentation

## 2016-08-08 DIAGNOSIS — F1010 Alcohol abuse, uncomplicated: Secondary | ICD-10-CM

## 2016-08-08 DIAGNOSIS — I998 Other disorder of circulatory system: Secondary | ICD-10-CM

## 2016-08-08 DIAGNOSIS — Z87891 Personal history of nicotine dependence: Secondary | ICD-10-CM | POA: Insufficient documentation

## 2016-08-08 DIAGNOSIS — F1020 Alcohol dependence, uncomplicated: Secondary | ICD-10-CM | POA: Insufficient documentation

## 2016-08-08 MED ORDER — GABAPENTIN 300 MG OR CAPS
300.0000 mg | ORAL_CAPSULE | Freq: Three times a day (TID) | ORAL | 0 refills | Status: DC
Start: 2016-08-08 — End: 2017-01-09

## 2016-08-08 MED ORDER — OXYCODONE HCL 10 MG OR TABS
10.0000 mg | ORAL_TABLET | Freq: Four times a day (QID) | ORAL | 0 refills | Status: DC | PRN
Start: 2016-08-08 — End: 2016-09-12

## 2016-08-08 MED ORDER — APIXABAN 5 MG PO TABS
5.0000 mg | ORAL_TABLET | Freq: Two times a day (BID) | ORAL | 3 refills | Status: DC
Start: 2016-08-08 — End: 2016-08-08

## 2016-08-08 MED ORDER — APIXABAN 2.5 MG PO TABS
5.0000 mg | ORAL_TABLET | Freq: Two times a day (BID) | ORAL | 3 refills | Status: DC
Start: 2016-08-08 — End: 2016-08-29

## 2016-08-08 MED ORDER — PENTOXIFYLLINE CR 400 MG OR TBCR
400.0000 mg | EXTENDED_RELEASE_TABLET | Freq: Three times a day (TID) | ORAL | 0 refills | Status: DC
Start: 2016-08-08 — End: 2016-09-12

## 2016-08-08 MED ORDER — HYDROCODONE-ACETAMINOPHEN 7.5-325 MG OR TABS
1.0000 | ORAL_TABLET | Freq: Four times a day (QID) | ORAL | 0 refills | Status: DC | PRN
Start: 2016-08-08 — End: 2016-08-27

## 2016-08-08 MED ORDER — TRAMADOL HCL 50 MG OR TABS
50.0000 mg | ORAL_TABLET | Freq: Four times a day (QID) | ORAL | 0 refills | Status: DC | PRN
Start: 2016-08-08 — End: 2016-08-27

## 2016-08-08 NOTE — Patient Instructions (Signed)
Referral to PT/OT made: [phone]    Please return in 1 month, see upcoming appointment list for details.

## 2016-08-08 NOTE — Progress Notes (Signed)
Dalene CarrowNii-Kabu Lancer Thurner  MD FACS    Division of Vascular and Endovascular Surgery  [address] [place], [address]  [place] [id number]  [email or web address]  Appointments: [phone] Fax: [phone]      Consultation                                    Date of Evaluation: 08/08/16    Referring Physician: Dalene CarrowKabutey, Nii-Kabu    Primary Care Physician: Nonie HoyerElzawahry, Heba    Consulting Attending Physician: Dr. Duanne GuessKabutey    Service Provided: Vascular/Endovascular Surgery Consultation    Reason for Consultation: chief complaint    Patient's Chief Complaint: chief complaint    History of Present Illness:    Jerome Hunt is a 41 year old male with a traumatic left lower extremity injury 3 years ago who presented to clinic with pain to left lower extremity concerning for claudication. Patient is a prior smoker for 10 years, quit 3 months prior. Brief clinical history included pain for the past 2 years, worsened this past May, with trouble walking/exertion. Patient states that the pain is primarily in the sole of his foot, and worsens with walking less than a block, improves with rest. Foot also becomes quite numb. Patient also states that he gets cramps at night, however, they are not localized, and they occur on both feet, and sometimes improves with rest. He was admitted to [facility] for rest pain of LLE and had angiogram 03/22/2016, which showed tri-vessel disease with physical therapy and peroneal artery run off distally. A balloon angioplasty of his posterior tibial artery was attempted, but failed. [facility] but [facility] Patient was previously on cilostazol, switched to pentoxifylline a couple months prior due to lightheadedness on cilostazol. Patient states that he hasn't walked much. ABI on 05/24/16 demonstrated 0.7 on LLE and 1.1 on RLE.No DVT BL. Vein mapping performed on 06/05/16 showed patent veins in the LLE. Given persistent symptoms  of claudication and failed balloon angioplasty, it was recommended for patient to undergo bypass surgery.  He presented as a planned admission on 07/06/16 and underwent the above stated procedures. He was transferred to the SICU intubated due to acidosis, as well as for close neurovascular monitoring. On POD1, however, his bypass was not palpable, there was no doppler signal and his foot was cold. His wife was contacted and he was emergently taken back to the OR for revision of his bypass on 07/07/16. He was again taken back to the SICU for close monitoring. Incidentally during this time, it was discovered that the patient has been secretly drinking heavily at home, which would likely explain his agitation and difficulty with anesthesia during his first operation. He was subsequently placed on CIWA protocol. He was kept intubated and sedated for increased agitation secondary to acute alcohol withdrawal, but was extubated on 07/09/16. On 07/11/16, patient was noted to have cool foot without palpable pulses.Pt had restraint on right ankle. This was removed and we attempted to assess pulses, which were not palpable nor could we initially appreciate a doppler signal on the PT or DP on the right. He had palpable femoral pulse on the right that was confirmed with doppler, no popliteal signal was present. The foot of the bed was removed as it appeared the patient was pressing his foot against the foot plate of the bed and locking his knee in  extension. We were initially planning to intubate the patient and take him down for a Stat CTA as the patients agitation from alcohol withdrawal would be prohibitive. During the time we were mobilizing resources to intubate and transport the patient to the CT scanner, the anterior tibial signal became present on doppler, and the decision was made to hold the CT scan and perform a lower extremity doppler study as we believed that he was likely in vasospasm likely from the  hyperextension of the knee or possibly compression from the restraint. Flow to the ankle was noted however, there was no pulsatile arterial flow in the dorsalis pedis or posterior tibial vessels in the foot. During PM rounds on 07/12/16, the patient was noted to be in more distress. He was hypotensive with blood pressure in the 90s as well as worsening tachycardia. On exam, his abdomen was taught and more distended. A review of patient labs showed elevated WBC to 18 with worsening lactic acidosis and the patient had several loose bowel movements. Additionally, the CK was elevated. He had bialteral AT signals in both extremities. Given findings, we were concerned the patient may have developed antibiotic associated colitis and had become septic from the infection. Given his altered mental status from his alcohol withdrawal, we felt the safest course of action would be to intubate and sedate the patient. We would then proceed with CT scan of the head, abdomen and lower extremity runoff as he has been unable to safely travel to the CT scanner for the past several days due to agitation. The patient was intubated by the ICU team and started on empiric treatment for Cdiff colitis. KUB showed colitis. CT abdomen showed pan colitis. C.diff toxinsubsequently came back positive. ID was consulted and assisted with antibiotic management. He was extubated on 12/14. His stool output continued to decrease and rectal tube removed on 12/18. His mentation improved and agitation resolved so CIWA protocol was discontinued on 12/20, and he was transferred out of the SICU on the same day. He began to work with PT/OT on a regular basis to regain his strength and function. His PO intake increased and he was medically stable for discharge. On the day of discharge (HOD21), patient had stable vitals and labs, tolerating diet without nausea or vomiting, pain controlled on oral medications, voiding spontaneously without difficulty, out of  bed, ambulating, and ready to be discharged.    Patient is here for follow-up:  C/o severe forefoot pain  States he is out of all medications including pain medications and eliquis  Distal tips of left toes are black and necrotic  He has an appt to see chronic pain  Denies fever/chills/nausea/vomiting      Past Medical History:  Patient Active Problem List   Diagnosis    Peripheral artery disease (CMS-HCC)    Peripheral vascular disease (CMS-HCC)    Clostridium difficile colitis    Alcoholism /alcohol abuse (CMS-HCC)    Alcohol withdrawal delirium, acute, hyperactive (CMS-HCC)       Past Surgical History:   No past surgical history on file.    Social History:  Social History     Social History    Marital status: Married     Spouse name: N/A    Number of children: N/A    Years of education: N/A     Occupational History    Not on file.     Social History Main Topics    Smoking status: Former Smoker    Smokeless tobacco:  Former Neurosurgeon    Alcohol use Not on file    Drug use: Not on file    Sexual activity: Not on file     Other Topics Concern    Not on file     Social History Narrative       Family History:  No family history on file.    Home Medications:  Outpatient Prescriptions Marked as Taking for the 08/08/16 encounter (Office Visit) with Dalene Carrow, MD   Medication Sig Dispense Refill    amitriptyline (ELAVIL) 25 MG tablet Take 25 mg by mouth daily.    4    ASPIRIN 81 PO Take 81 mg by mouth daily.        metoprolol tartrate (LOPRESSOR) 25 MG tablet Take 1 tablet (25 mg) by mouth every 12 hours. 60 tablet 0    pentoxifylline (TRENTAL) 400 MG Controlled-Release tablet Take 400 mg by mouth 3 times daily.        traMADol (ULTRAM) 50 MG tablet Take 1 tablet (50 mg) by mouth every 4 hours as needed (Breakthrough Pain). 30 tablet 0       Current Outpatient Prescriptions:     amitriptyline (ELAVIL) 25 MG tablet, Take 25 mg by mouth daily.  , Disp: , Rfl: 4    ASPIRIN 81 PO, Take 81 mg by mouth  daily.  , Disp: , Rfl:     [DISCONTINUED] gabapentin (NEURONTIN) 300 MG capsule, Take 1 capsule (300 mg) by mouth every 8 hours., Disp: 50 capsule, Rfl: 0    metoprolol tartrate (LOPRESSOR) 25 MG tablet, Take 1 tablet (25 mg) by mouth every 12 hours., Disp: 60 tablet, Rfl: 0    oxyCODONE (ROXICODONE) 10 MG tablet, Take 1 tablet (10 mg) by mouth every 6 hours as needed for Moderate Pain (Pain Score 4-6) or Severe Pain (Pain Score 7-10)., Disp: 30 tablet, Rfl: 0    pentoxifylline (TRENTAL) 400 MG Controlled-Release tablet, Take 400 mg by mouth 3 times daily.  , Disp: , Rfl:     traMADol (ULTRAM) 50 MG tablet, Take 1 tablet (50 mg) by mouth every 4 hours as needed (Breakthrough Pain)., Disp: 30 tablet, Rfl: 0  No current facility-administered medications for this visit.     Facility-Administered Medications Ordered in Other Visits:     propofol (DIPRIVAN) injection (200 mg/20 mL), , IntraVENOUS, Intra-Op PRN, Ernestina Patches, MD, 200 mg at 07/12/16 1325    rocuronium (ZEMURON) syringe, , IntraVENOUS, Intra-Op PRN, Ernestina Patches, MD, 50 mg at 07/12/16 1325    Allergies:   Allergies   Allergen Reactions    No Known Allergies [Other] Other     Received name: No Known Allergies        Review of Systems: A 12 point review of systems was completed, reviewed, and pertinent positives and negatives noted in the Patient Health Survey Form archived in [facility].     Physical Exam:  BP 152/88   Pulse 125   Temp 98.3 F (36.8 C) (Oral)   Resp 14   Ht 6\' 3"  (1.905 m)   Wt 95.4 kg (210 lb 5.1 oz)   BMI 26.29 kg/m2  General:  Well developed, well nourished,  41 yo male, no acute distress.  Alert, oriented x4.  HEENT: Normocephalic, atraumatic.  Pupils equal, round, reactive to light and accommodation; Sclerae anicteric. Conjunctiva pink. Oropharynx is moist with no lesions. Tongue midline. Dentition intact  Neck: Supple.  Nontender. Trachea in midline. No lymphadenopathy.  Carotid upstroke is normal  with no audible  bruits. No jugular venous distension.  Chest: Normal AP diameter. No dullness to percussion. Diaphragms have normal movement bilaterally.  Lungs: Clear to ausculation. Equal breath sound with no rales, rhonchi or wheezes.  No dullness to percussion.  Heart: No precordial lifts, heaves or thrills.  Regular rate and rhythm. Normal S1, S2. No gallops, rubs or murmurs.  Abdomen: Flat with active bowel sound to auscultation.  No palpable masses.  Aortic pulsation midline with no lateral enlargement.  No hernias.  Extremities: LLE with necrotic tips to toes, medial sutures removed with some fibropurulent discharge. Lateral incisions healed, sutures removed.  Back:  No kyphoscoliosis.  No CVA tenderness to percussion.  Skin:  Warm, dry, with no lesions. Normal turgor.  Neurologic: Cranial II-XII intact.  No focal motor, sensory or cerebellar neurologic findings.  Deep tendon reflexis are 2+ and equal.      Functional Status: partially dependent    Diagnostics:  Albumin   Date Value Ref Range Status   07/14/2016 2.6 (L) 4.2 - 5.5 G/DL Final     ALT   Date Value Ref Range Status   07/14/2016 23 7 - 52 U/L Final     AST   Date Value Ref Range Status   07/14/2016 45 (H) 13 - 39 U/L Final     BUN   Date Value Ref Range Status   07/27/2016 9 7 - 25 mg/dL Final     Calcium   Date Value Ref Range Status   07/27/2016 9.3 8.6 - 10.3 mg/dL Final     Chloride   Date Value Ref Range Status   07/27/2016 102 98 - 107 mmol/L Final     Cholesterol   Date Value Ref Range Status   05/05/2016 237 (H) <200 MG/DL Final     Comment:     Desirable: <200 mg/dL  Borderline High: 098-119 mg/dL  High: >147 mg/dL       CO2   Date Value Ref Range Status   07/27/2016 27 21 - 31 mmol/L Final     Creat   Date Value Ref Range Status   07/27/2016 0.8 0.7 - 1.3 mg/dL Final     GFR Non-African American   Date Value Ref Range Status   07/27/2016 >60 >59 Final     GFR African American   Date Value Ref Range Status   07/27/2016 >60 >59 Final     Comment:      (Unit: mL/min/1.73 sq mtr)  The GFR calculation is an estimate and is NOT an accurate reflection  of GFR among patients on dialysis.       Glucose   Date Value Ref Range Status   07/27/2016 106 70 - 115 mg/dL Final     Comment:        Normal Fasting Glucose:  <100 mg/dL  Impaired Fasting Glucose: 100-125 mg/dL  Provisional DX of diabetes(must be confirmed) >125 mg/dL       Hgb   Date Value Ref Range Status   07/27/2016 9.7 (L) 13.5 - 16.9 G/DL Final     Magnesium   Date Value Ref Range Status   07/27/2016 2.0 1.9 - 2.7 mg/dL Final     Phosphorus   Date Value Ref Range Status   07/27/2016 3.6 2.5 - 5.0 MG/DL Final     PLT Count   Date Value Ref Range Status   07/27/2016 687 (H) 150 - 400 THOUS/MCL Final     Potassium   Date  Value Ref Range Status   07/27/2016 3.9 3.5 - 5.1 mmol/L Final     Sodium   Date Value Ref Range Status   07/27/2016 137 136 - 145 mmol/L Final     Triglycerides   Date Value Ref Range Status   05/05/2016 139 <150 MG/DL Final     Comment:     Normal: <150 mg/dL  Borderline High: 161-096 mg/dL  High: 045-409 mg/dL  Very high: > or = 811 mg/dL       Patient Active Problem List   Diagnosis    Peripheral artery disease (CMS-HCC)    Peripheral vascular disease (CMS-HCC)    Clostridium difficile colitis    Alcoholism /alcohol abuse (CMS-HCC)    Alcohol withdrawal delirium, acute, hyperactive (CMS-HCC)   1.         Assessment/Plan:  1. 40 Y M with thrombosed LLE bypass x 2, now with worsening pain and necrotic tips to toes  - tips of toes will slough off  - sutures removed. Pt instructed to wash wounds daily with soap and water, ok for gentle scrubbing  - cover medial ankle wound with bacitracin  - Outpatient PT referral  - Shower chair  - Refill pain meds  - Ensure that pt has palliative pain appt  - Encourage ETOH cessation        Pt ambulating  Persistent BL LE pain  Gangrene tips of toes  Spent 45 mins discussing recent hospitalization  Will obtain outpt PT   Palliative pain appointment  pending  Again emphasized importance of medical compliance.     FU 4 wks

## 2016-08-10 ENCOUNTER — Telehealth: Payer: Self-pay | Admitting: Vascular & Interventional Radiology

## 2016-08-10 ENCOUNTER — Ambulatory Visit: Payer: Self-pay | Admitting: Vascular Surgery

## 2016-08-10 NOTE — Telephone Encounter (Signed)
Patient is calling in regards to prior auth form his wife left at front desk yesterday regarding his eliquis and blood thinners. States they can pick it up if needed but pharmacy fax number should be on the form. Please reach out to further assist. Patient states he can not miss another day of his eliquis. Thank you

## 2016-08-10 NOTE — Telephone Encounter (Signed)
Spoke with patient, prior authorizations were submitted yesterday for patient, still processing- patient is aware.

## 2016-08-14 NOTE — Telephone Encounter (Signed)
Spoke with patient, prior authorization was approved, called patient and he is aware.

## 2016-08-14 NOTE — Telephone Encounter (Signed)
Patient states he would like an update on eliquis refill, please call to further assist thank you

## 2016-08-15 ENCOUNTER — Ambulatory Visit: Payer: Self-pay | Admitting: Vascular & Interventional Radiology

## 2016-08-15 ENCOUNTER — Ambulatory Visit: Payer: Medicaid Other | Admitting: Vascular & Interventional Radiology

## 2016-08-15 NOTE — Telephone Encounter (Signed)
[facility]. States the pharmacy informed him that the wrong dose was entered. Needs to be changed from a 2.5 mg to 5 mg. Please reach out to confirm. Thank you

## 2016-08-27 ENCOUNTER — Other Ambulatory Visit: Payer: Self-pay

## 2016-08-27 ENCOUNTER — Ambulatory Visit (HOSPITAL_BASED_OUTPATIENT_CLINIC_OR_DEPARTMENT_OTHER): Payer: No Typology Code available for payment source

## 2016-08-27 VITALS — BP 135/91 | HR 103 | Temp 98.1°F | Resp 18 | Wt 221.3 lb

## 2016-08-27 DIAGNOSIS — M79605 Pain in left leg: Principal | ICD-10-CM

## 2016-08-27 DIAGNOSIS — I739 Peripheral vascular disease, unspecified: Secondary | ICD-10-CM

## 2016-08-27 MED ORDER — TRAMADOL HCL 50 MG OR TABS
50.0000 mg | ORAL_TABLET | Freq: Four times a day (QID) | ORAL | 0 refills | Status: DC | PRN
Start: 2016-08-27 — End: 2016-08-27

## 2016-08-27 MED ORDER — TRAMADOL HCL 50 MG OR TABS
50.0000 mg | ORAL_TABLET | Freq: Four times a day (QID) | ORAL | 0 refills | Status: DC | PRN
Start: 2016-08-27 — End: 2016-09-12

## 2016-08-27 MED ORDER — HYDROCODONE-ACETAMINOPHEN 10-325 MG OR TABS
1.0000 | ORAL_TABLET | ORAL | 0 refills | Status: DC | PRN
Start: 2016-08-27 — End: 2017-01-09

## 2016-08-27 MED ORDER — HYDROCODONE-ACETAMINOPHEN 10-325 MG OR TABS
1.0000 | ORAL_TABLET | ORAL | 0 refills | Status: DC | PRN
Start: 2016-08-27 — End: 2016-08-27
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
## 2016-08-29 ENCOUNTER — Encounter: Payer: Self-pay | Admitting: Student in an Organized Health Care Education/Training Program

## 2016-08-29 DIAGNOSIS — T82868D Thrombosis of vascular prosthetic devices, implants and grafts, subsequent encounter: Secondary | ICD-10-CM

## 2016-08-29 DIAGNOSIS — I739 Peripheral vascular disease, unspecified: Secondary | ICD-10-CM

## 2016-08-29 MED ORDER — APIXABAN 5 MG PO TABS
5.0000 mg | ORAL_TABLET | Freq: Two times a day (BID) | ORAL | 3 refills | Status: DC
Start: 2016-08-29 — End: 2016-12-26

## 2016-08-29 NOTE — Progress Notes (Signed)
Encounter opened for RX correction/creation. Do not bill

## 2016-08-31 ENCOUNTER — Telehealth: Payer: Self-pay | Admitting: Vascular & Interventional Radiology

## 2016-08-31 NOTE — Telephone Encounter (Signed)
Spoke with patient and confirmed reception of fax, faxing over the records for prior auth request now. Patient is aware.

## 2016-08-31 NOTE — Telephone Encounter (Signed)
Patient needs a refill on his eliquis states pharmacy has PA but they need additional information. A fax was sent. Please call to further assist thank you

## 2016-09-10 ENCOUNTER — Ambulatory Visit: Payer: No Typology Code available for payment source

## 2016-09-12 ENCOUNTER — Ambulatory Visit: Payer: Self-pay | Admitting: Vascular & Interventional Radiology

## 2016-09-12 ENCOUNTER — Ambulatory Visit
Payer: No Typology Code available for payment source | Attending: Vascular & Interventional Radiology | Admitting: Vascular & Interventional Radiology

## 2016-09-12 ENCOUNTER — Encounter: Payer: Self-pay | Admitting: Vascular & Interventional Radiology

## 2016-09-12 DIAGNOSIS — I739 Peripheral vascular disease, unspecified: Principal | ICD-10-CM | POA: Insufficient documentation

## 2016-09-12 MED ORDER — PENTOXIFYLLINE CR 400 MG OR TBCR
400.0000 mg | EXTENDED_RELEASE_TABLET | Freq: Three times a day (TID) | ORAL | 3 refills | Status: DC
Start: 2016-09-12 — End: 2017-01-09

## 2016-09-12 MED ORDER — METOPROLOL TARTRATE 25 MG OR TABS
25.0000 mg | ORAL_TABLET | Freq: Two times a day (BID) | ORAL | 3 refills | Status: DC
Start: 2016-09-12 — End: 2017-01-16

## 2016-09-12 MED ORDER — HYDROCODONE-ACETAMINOPHEN 5-325 MG OR TABS
1.0000 | ORAL_TABLET | Freq: Four times a day (QID) | ORAL | 0 refills | Status: DC | PRN
Start: 2016-09-12 — End: 2017-01-09

## 2016-09-12 NOTE — Progress Notes (Signed)
Jerome CarrowNii-Kabu Micheal Sheen MD  Surgery and Radiology, Surgery  [address] [place], [address]  [place], [place] [id number]  [email or web address]  Appointments: [phone] Fax: [phone]      Date of Evaluation: 09/12/16    Primary Care Physician: Nonie HoyerElzawahry, [place]    Attending Physician: Jerome CarrowNii-Kabu Jerome Pistilli, MD (A)    Service Provided: Outpatient Vascular/Endovascular Surgery Follow-up Evaluation    Reason for Visit: chief complaint    History of Present Illness:   HPI: Jerome Hunt is a [age]year old male with recent prolonged hospitalization 07/2016. Past history of alcoholism requiring CIWA protocol (discontinued 12/20) presenting with a traumatic left lower extremity injury 3 years ago and pain to left lower extremity concerning for claudication. He was admitted to [facility] for rest pain of LLE and had angiogram 03/22/2016, which showed tri-vessel disease with physical therapy and peroneal artery run off distally. A balloon angioplasty of his posterior tibial artery was attempted, but failed. [facility], but [facility]  Given persistent symptoms of claudication and failed balloon angioplasty, bypass surgery was performed. However, bypass was not palpable and was sent to the OR for revision of his bypass on 07/07/16.  On 07/12/16, patient developed Cdiff colitis, back to baseline after a course of antibiotics. Continues with PT/OT on a regular basis to regain his strength and function.    On todays visit pt is doing well. 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 Pt elevates his leg most of the time. Able to walk a couple blocks. C diff has resolved, no residual diarrhea/abd pain/bowel dysfunction. Denies fevers/chills/nausea/vomiting/diarrhea/constipation/abd pain/CP/SOB=/ rest pain of LLE.     Past Medical History:  Patient Active Problem List   Diagnosis    Peripheral artery disease (CMS-HCC)    Peripheral  vascular disease (CMS-HCC)    Clostridium difficile colitis    Alcoholism /alcohol abuse (CMS-HCC)    Alcohol withdrawal delirium, acute, hyperactive (CMS-HCC)       Past Surgical History:   Refer to HPI    Home Medications:    Current Outpatient Prescriptions:     amitriptyline (ELAVIL) 25 MG tablet, Take 25 mg by mouth daily.  , Disp: , Rfl: 4    apixaban (ELIQUIS) 5 MG TABS, Take 1 tablet (5 mg) by mouth every 12 hours., Disp: 60 tablet, Rfl: 3    ASPIRIN 81 PO, Take 81 mg by mouth daily.  , Disp: , Rfl:     gabapentin (NEURONTIN) 300 MG capsule, Take 1 capsule (300 mg) by mouth 3 times daily., Disp: 100 capsule, Rfl: 0    HYDROcodone-acetaminophen (NORCO) 10-325 MG tablet, Take 1 tablet by mouth every 4 hours as needed for Moderate Pain (Pain Score 4-6). Earliest Fill Date: 08/27/16, Disp: 180 tablet, Rfl: 0    HYDROcodone-acetaminophen (NORCO) 5-325 MG tablet, Take 1 tablet by mouth every 6 hours as needed for Moderate Pain (Pain Score 4-6)., Disp: 60 tablet, Rfl: 0    metoprolol tartrate (LOPRESSOR) 25 MG tablet, Take 1 tablet (25 mg) by mouth every 12 hours., Disp: 60 tablet, Rfl: 3    pentoxifylline (TRENTAL) 400 MG Controlled-Release tablet, Take 1 tablet (400 mg) by mouth 3 times daily (with meals)., Disp: 90 tablet, Rfl: 3    traMADol (ULTRAM) 50 MG tablet, Take 1 tablet (50 mg) by mouth every 4 hours as needed ([facility] Pain)., Disp: 30 tablet, Rfl: 0  No current facility-administered medications for this  visit.     Facility-Administered Medications Ordered in Other Visits:     propofol (DIPRIVAN) injection (200 mg/20 mL), , IntraVENOUS, Intra-Op PRN, Ernestina Patches, MD, 200 mg at 07/12/16 1325    rocuronium (ZEMURON) syringe, , IntraVENOUS, Intra-Op PRN, Ernestina Patches, MD, 50 mg at 07/12/16 1325     Allergies:   Allergies   Allergen Reactions    No Known Allergies [Other] Other     Received name: No Known Allergies        Review of Systems: A 12-point review of systems was completed,  reviewed, and pertinent positives noted in the HPI.     Physical Exam:  BP 143/90 (BP Location: Left arm, BP Patient Position: Sitting, BP cuff size: Large)   Pulse 112   Temp 98.1 F (36.7 C) (Tympanic)   Resp 18   Ht 6\' 3"  (1.905 m)   Wt 102.3 kg (225 lb 8.5 oz)   BMI 28.19 kg/m2  General: Well developed, well nourished. No acute distress.  Alert, oriented to person, place, time and situation.  HEENT: Normocephalic and atraumatic.  Pupils equal, round, reactive to light and accommodation.  Extraocular movements intact.  Moist mucous membranes.  Cardiovascular: Regular rate and rhythm.   Pulmonary: Symmetric expansion. Unlabored breathing.  Abdomen: Soft, nondistended.   NEURO: Cranial nerves II-XII intact. No gross abnormalities noted.  Extremities: LLE edema present but much improved since last seen. Dopplerable DP pulses bilaterally. Left 2nd, 3rd, 4th toe tip dry gangrene, no erythema/purulence/drainage, has not progressed since last seen. All incision sites healed. Left heal wound completely healed.     Functional Status:  Partially Dependent     Diagnostics:  Albumin   Date Value Ref Range Status   07/14/2016 2.6 (L) 4.2 - 5.5 G/DL Final     ALT   Date Value Ref Range Status   07/14/2016 23 7 - 52 U/L Final     AST   Date Value Ref Range Status   07/14/2016 45 (H) 13 - 39 U/L Final     BUN   Date Value Ref Range Status   07/27/2016 9 7 - 25 mg/dL Final     Calcium   Date Value Ref Range Status   07/27/2016 9.3 8.6 - 10.3 mg/dL Final     Chloride   Date Value Ref Range Status   07/27/2016 102 98 - 107 mmol/L Final     Cholesterol   Date Value Ref Range Status   05/05/2016 237 (H) <200 MG/DL Final     Comment:     Desirable: <200 mg/dL  Borderline High: 161-096 mg/dL  High: >045 mg/dL       CO2   Date Value Ref Range Status   07/27/2016 27 21 - 31 mmol/L Final     Creat   Date Value Ref Range Status   07/27/2016 0.8 0.7 - 1.3 mg/dL Final     GFR Non-African American   Date Value Ref Range Status   07/27/2016  >60 >59 Final     GFR African American   Date Value Ref Range Status   07/27/2016 >60 >59 Final     Comment:     (Unit: mL/min/1.73 sq mtr)  The GFR calculation is an estimate and is NOT an accurate reflection  of GFR among patients on dialysis.       Glucose   Date Value Ref Range Status   07/27/2016 106 70 - 115 mg/dL Final     Comment:  Normal Fasting Glucose:  <100 mg/dL  Impaired Fasting Glucose: 100-125 mg/dL  Provisional DX of diabetes(must be confirmed) >125 mg/dL       Hgb   Date Value Ref Range Status   07/27/2016 9.7 (L) 13.5 - 16.9 G/DL Final     Magnesium   Date Value Ref Range Status   07/27/2016 2.0 1.9 - 2.7 mg/dL Final     Phosphorus   Date Value Ref Range Status   07/27/2016 3.6 2.5 - 5.0 MG/DL Final     PLT Count   Date Value Ref Range Status   07/27/2016 687 (H) 150 - 400 THOUS/MCL Final     Potassium   Date Value Ref Range Status   07/27/2016 3.9 3.5 - 5.1 mmol/L Final     Sodium   Date Value Ref Range Status   07/27/2016 137 136 - 145 mmol/L Final     Triglycerides   Date Value Ref Range Status   05/05/2016 139 <150 MG/DL Final     Comment:     Normal: <150 mg/dL  Borderline High: 562-130 mg/dL  High: 865-784 mg/dL  Very high: > or = 696 mg/dL         Radiology:  No relevant imaging studies for this clinic visit.     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    - continue local wound care, keep toes dry allow to naturally slough off, wash feet gently with soap and water daily  - f/u chronic pain as outpt  - establish PCP for BP management  - compression stockings ordered today  - Refill lopressor  - RTC 3 months for wound check         Scribe Attestation  The notes I am recording reflect only actions made by and judgments taken by this provider, Dr. Duanne Guess, for whom I am scribing today.  I have performed no independent clinical work.    Donna Bernard    The patient was seen and examined  by Dr. Duanne Guess who agrees with above assessment and plan.    ____________________________________________________________________    Provider Attestation for Scribed Note    As the attending provider, I agree with the scribed content.  Any changes or edits are noted in the text above.    Nii-Kabu [employer]

## 2016-09-12 NOTE — Patient Instructions (Signed)
Please follow up in 3 months.  

## 2016-09-25 ENCOUNTER — Telehealth: Payer: Self-pay | Admitting: Vascular & Interventional Radiology

## 2016-09-25 NOTE — Telephone Encounter (Signed)
Pt called stating [facility] and Dr Mort SawyersFujitani [facility] but [facility] declined at that [facility].  He called asking to schedule an appt but there is nothing until May.  Please call back to assist with a sooner appt.

## 2016-09-25 NOTE — Telephone Encounter (Signed)
Rescheduled for sooner appt next week, patient states he went to outside ED and was [facility] but did not get [facility], was instructed to f/u with vascular sooner than 3 months. Patient states he had new onset pain in his foot and was concerned. Appt moved and patient contacted, he is aware, no other concerns.

## 2016-10-01 ENCOUNTER — Ambulatory Visit: Payer: No Typology Code available for payment source

## 2016-10-01 NOTE — Telephone Encounter (Signed)
A user error has taken place: encounter opened in error, closed for administrative reasons.

## 2016-10-03 ENCOUNTER — Encounter: Payer: Self-pay | Admitting: Vascular & Interventional Radiology

## 2016-10-03 ENCOUNTER — Ambulatory Visit
Payer: No Typology Code available for payment source | Attending: Vascular & Interventional Radiology | Admitting: Vascular & Interventional Radiology

## 2016-10-03 VITALS — BP 138/73 | HR 86 | Temp 96.9°F | Resp 14 | Ht 75.0 in | Wt 239.8 lb

## 2016-10-03 DIAGNOSIS — M79606 Pain in leg, unspecified: Secondary | ICD-10-CM

## 2016-10-03 DIAGNOSIS — Z95828 Presence of other vascular implants and grafts: Principal | ICD-10-CM

## 2016-10-03 DIAGNOSIS — I739 Peripheral vascular disease, unspecified: Secondary | ICD-10-CM | POA: Insufficient documentation

## 2016-10-03 MED ORDER — OXYCODONE-ACETAMINOPHEN 5-325 MG OR TABS
1.00 | ORAL_TABLET | Freq: Four times a day (QID) | ORAL | 0 refills | Status: DC | PRN
Start: 2016-09-24 — End: 2017-01-09

## 2016-10-03 NOTE — Progress Notes (Signed)
Dalene Carrow MD  Surgery and Radiology, Surgery  [address], [address]  [place], [place] [address]  [email or web address]  Appointments: [phone] Fax: [phone]      Date of Evaluation: 10/03/16    Primary Care Physician: Nonie Hoyer    Attending Physician: Dalene Carrow, MD (A)    Service Provided: Outpatient Vascular/Endovascular Surgery Follow-up Evaluation    Reason for Visit: Management of LLE Claudication     History of Present Illness:   HPI: Jerome Hunt is a 41year old male with recent prolonged hospitalization 07/2016. Past history of alcoholism requiring CIWA protocol (discontinued 12/20) presenting with a traumatic left lower extremity injury 3 years ago and pain to left lower extremity concerning for claudication. He was admitted to [facility]) for rest pain of LLE and had angiogram 03/22/2016, which showed tri-vessel disease with physical therapy and peroneal artery run off distally. A balloon angioplasty of his posterior tibial artery was attempted, but failed. He was later readmitted at [facility] with similar symptoms, but they referred him to [facility].  Given persistent symptoms of claudication and failed balloon angioplasty, bypass surgery was performed. However, bypass was not palpable and was sent to the OR for revision of his bypass on 07/07/16.  On 07/12/16, patient developed Cdiff colitis, back to baseline after a course of antibiotics. Continues with PT/OT on a regular basis to regain his strength and function.  Patient was last seen on 09/12/16 at which time there was no progression of dry gangrene.     On todays visit pt is comfortable with mild pain in his Right toes which is his baseline. States he felt new dull stabbing pain in the pad of his foot radiating to his calf about a week or two ago.  At that time he went to the ED in [facility] and was advised by Dr. Mort Sawyers to be transferred to the ED at [facility], but given the circumstances, he was unable  to do so.  He returns today for follow up. He was able to follow up with pain management and had the dosages of his pain medications increased which have helped his pain control significantly.  Denies fevers/chills/erythema/purulent drainage/CP/SOB/new wounds.     Past Medical History:  Patient Active Problem List   Diagnosis    Peripheral artery disease (CMS-HCC)    Peripheral vascular disease (CMS-HCC)    Clostridium difficile colitis    Alcoholism /alcohol abuse (CMS-HCC)    Alcohol withdrawal delirium, acute, hyperactive (CMS-HCC)     Past Surgical History:   Refer to HPI    Home Medications:    Current Outpatient Prescriptions:     amitriptyline (ELAVIL) 25 MG tablet, Take 25 mg by mouth daily.  , Disp: , Rfl: 4    apixaban (ELIQUIS) 5 MG TABS, Take 1 tablet (5 mg) by mouth every 12 hours., Disp: 60 tablet, Rfl: 3    ASPIRIN 81 PO, Take 81 mg by mouth daily.  , Disp: , Rfl:     gabapentin (NEURONTIN) 300 MG capsule, Take 1 capsule (300 mg) by mouth 3 times daily., Disp: 100 capsule, Rfl: 0    HYDROcodone-acetaminophen (NORCO) 10-325 MG tablet, Take 1 tablet by mouth every 4 hours as needed for Moderate Pain (Pain Score 4-6). Earliest Fill Date: 08/27/16, Disp: 180 tablet, Rfl: 0    HYDROcodone-acetaminophen (NORCO) 5-325 MG tablet, Take 1 tablet by mouth every 6 hours as needed for Moderate Pain (Pain Score 4-6)., Disp: 60 tablet, Rfl: 0  metoprolol tartrate (LOPRESSOR) 25 MG tablet, Take 1 tablet (25 mg) by mouth every 12 hours., Disp: 60 tablet, Rfl: 3    oxyCODONE-acetaminophen (PERCOCET) 5-325 MG tablet, Take 1 tablet by mouth every 6 hours as needed., Disp: , Rfl: 0    pentoxifylline (TRENTAL) 400 MG Controlled-Release tablet, Take 1 tablet (400 mg) by mouth 3 times daily (with meals)., Disp: 90 tablet, Rfl: 3    traMADol (ULTRAM) 50 MG tablet, Take 1 tablet (50 mg) by mouth every 4 hours as needed (Breakthrough Pain)., Disp: 30 tablet, Rfl: 0  No current facility-administered medications  for this visit.     Facility-Administered Medications Ordered in Other Visits:     propofol (DIPRIVAN) injection (200 mg/20 mL), , IntraVENOUS, Intra-Op PRN, Ernestina Patches, MD, 200 mg at 07/12/16 1325    rocuronium (ZEMURON) syringe, , IntraVENOUS, Intra-Op PRN, Ernestina Patches, MD, 50 mg at 07/12/16 1325     Allergies:   Allergies   Allergen Reactions    No Known Allergies [Other] Other     Received name: No Known Allergies        Review of Systems: A 12-point review of systems was completed, reviewed, and pertinent positives noted in the HPI.     Physical Exam:  BP 138/73   Pulse 86   Temp 96.9 F (36.1 C) (Tympanic)   Resp 14   Ht 6\' 3"  (1.905 m)   Wt 108.7 kg (239 lb 12 oz)   BMI 29.97 kg/m2  General: Well developed, well nourished. No acute distress.  Alert, oriented to person, place, time and situation.  HEENT: Normocephalic and atraumatic.  Pupils equal, round, reactive to light and accommodation.  Extraocular movements intact.  Moist mucous membranes.   Cardiovascular: Regular rate and rhythm.  Pulmonary:  Symmetric expansion. Unlabored breathing.  Abdomen: Soft,nondistended.   NEURO: Cranial nerves II-XII intact. No gross abnormalities noted.  Extremities: LLE edema present improved since last visit. Dopplerable DP pulses bilaterally. Left 2nd, 3rd, 4th toe tip dry gangrene, no erythema/purulence/drainage, has not progressed since last seen. All incision sites healed. Left heal wound completely healed.       Functional Status: Partially Dependent     Diagnostics:  Albumin   Date Value Ref Range Status   07/14/2016 2.6 (L) 4.2 - 5.5 G/DL Final     ALT   Date Value Ref Range Status   07/14/2016 23 7 - 52 U/L Final     AST   Date Value Ref Range Status   07/14/2016 45 (H) 13 - 39 U/L Final     BUN   Date Value Ref Range Status   07/27/2016 9 7 - 25 mg/dL Final     Calcium   Date Value Ref Range Status   07/27/2016 9.3 8.6 - 10.3 mg/dL Final     Chloride   Date Value Ref Range Status   07/27/2016 102 98 -  107 mmol/L Final     Cholesterol   Date Value Ref Range Status   05/05/2016 237 (H) <200 MG/DL Final     Comment:     Desirable: <200 mg/dL  Borderline High: 161-096 mg/dL  High: >045 mg/dL       CO2   Date Value Ref Range Status   07/27/2016 27 21 - 31 mmol/L Final     Creat   Date Value Ref Range Status   07/27/2016 0.8 0.7 - 1.3 mg/dL Final     GFR Non-African American   Date Value Ref Range  Status   07/27/2016 >60 >59 Final     GFR African American   Date Value Ref Range Status   07/27/2016 >60 >59 Final     Comment:     (Unit: mL/min/1.73 sq mtr)  The GFR calculation is an estimate and is NOT an accurate reflection  of GFR among patients on dialysis.       Glucose   Date Value Ref Range Status   07/27/2016 106 70 - 115 mg/dL Final     Comment:        Normal Fasting Glucose:  <100 mg/dL  Impaired Fasting Glucose: 100-125 mg/dL  Provisional DX of diabetes(must be confirmed) >125 mg/dL       Hgb   Date Value Ref Range Status   07/27/2016 9.7 (L) 13.5 - 16.9 G/DL Final     Magnesium   Date Value Ref Range Status   07/27/2016 2.0 1.9 - 2.7 mg/dL Final     Phosphorus   Date Value Ref Range Status   07/27/2016 3.6 2.5 - 5.0 MG/DL Final     PLT Count   Date Value Ref Range Status   07/27/2016 687 (H) 150 - 400 THOUS/MCL Final     Potassium   Date Value Ref Range Status   07/27/2016 3.9 3.5 - 5.1 mmol/L Final     Sodium   Date Value Ref Range Status   07/27/2016 137 136 - 145 mmol/L Final     Triglycerides   Date Value Ref Range Status   05/05/2016 139 <150 MG/DL Final     Comment:     Normal: <150 mg/dL  Borderline High: 960-454150-199 mg/dL  High: 098-119200-499 mg/dL  Very high: > or = 147500 mg/dL         Radiology:  No relevant imaging studies for this clinic visit.     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  On exam his dry gangrene of left tips of toes is stable with no further progression.  No evidence of cellulitis, no  purulent drainage.     - RTC in 3 months for wound check  - Encouraged wearing compression stockings for edema  - f/u Pain management as needed  - BP better controlled today, continue to f/u PCP   - Discussed with pt the option of potential future BKA if his lifestyle is restricted or he can no longer bear with Right foot pain.         Scribe Attestation  The notes I am recording reflect only actions made by and judgments taken by this provider, Dr. Duanne GuessKabutey, for whom I am scribing today.  I have performed no independent clinical work.    Donna BernardKen Siangchin    The patient was seen and examined by Dr. Duanne GuessKabutey who agrees with above assessment and plan.    ____________________________________________________________________    Provider Attestation for Scribed Note    As the attending provider, I agree with the scribed content.  Any changes or edits are noted in the text above.    [employer]u [employer]ab[employer]y

## 2016-10-15 ENCOUNTER — Telehealth: Payer: Self-pay

## 2016-10-15 NOTE — Telephone Encounter (Signed)
Patient walked into clinic today with steady gait c/o pain after PT, patient is currently followed by pain management at outside clinic. Notice received from [employer] that all controlled medications will require preauthorization as patient has had overuse of prescriptions. Restriction dates will be effective 12/04/2016 to 12/03/2017. Patient stating he might go to ED for pain medication. Spoke with triage in ED and made aware patient may be coming in, faxed over notice of concern for overuse.

## 2016-10-17 ENCOUNTER — Telehealth: Payer: Self-pay | Admitting: Vascular & Interventional Radiology

## 2016-10-17 NOTE — Telephone Encounter (Signed)
Please call pts wife states earlier this week he dropped off disability papers and was wanting to get the status and if they were completed to please call and advise [phone] thank you

## 2016-10-17 NOTE — Telephone Encounter (Signed)
Spoke with patient wife, will be picking up paperwork this afternoon. Left paperwork at the front desk.

## 2016-12-12 ENCOUNTER — Ambulatory Visit: Payer: Self-pay | Admitting: Vascular & Interventional Radiology

## 2016-12-20 ENCOUNTER — Encounter: Payer: Self-pay | Admitting: Family

## 2016-12-20 DIAGNOSIS — I739 Peripheral vascular disease, unspecified: Secondary | ICD-10-CM

## 2016-12-26 ENCOUNTER — Encounter: Payer: Self-pay | Admitting: Vascular & Interventional Radiology

## 2016-12-26 ENCOUNTER — Ambulatory Visit
Payer: No Typology Code available for payment source | Attending: Vascular & Interventional Radiology | Admitting: Vascular & Interventional Radiology

## 2016-12-26 ENCOUNTER — Ambulatory Visit: Payer: Self-pay | Admitting: Vascular & Interventional Radiology

## 2016-12-26 DIAGNOSIS — I739 Peripheral vascular disease, unspecified: Principal | ICD-10-CM | POA: Insufficient documentation

## 2016-12-26 DIAGNOSIS — T82868D Thrombosis of vascular prosthetic devices, implants and grafts, subsequent encounter: Secondary | ICD-10-CM

## 2016-12-26 MED ORDER — GABAPENTIN 400 MG OR CAPS
400.00 mg | ORAL_CAPSULE | ORAL | Status: DC
Start: ? — End: 2017-01-09

## 2016-12-26 MED ORDER — PENTOXIFYLLINE CR 400 MG OR TBCR
400.00 mg | EXTENDED_RELEASE_TABLET | ORAL | Status: DC
Start: ? — End: 2017-01-09

## 2016-12-26 MED ORDER — GABAPENTIN 400 MG OR CAPS
800.00 mg | ORAL_CAPSULE | Freq: Three times a day (TID) | ORAL | 1 refills | Status: DC
Start: 2016-12-01 — End: 2017-01-09

## 2016-12-26 MED ORDER — CILOSTAZOL 100 MG OR TABS
100.0000 mg | ORAL_TABLET | Freq: Every day | ORAL | 2 refills | Status: DC
Start: 2016-12-26 — End: 2017-01-09

## 2016-12-26 MED ORDER — APIXABAN 5 MG PO TABS
5.00 mg | ORAL_TABLET | ORAL | Status: DC
Start: ? — End: 2017-01-09

## 2016-12-26 MED ORDER — GABAPENTIN 800 MG OR TABS
800.00 mg | ORAL_TABLET | Freq: Three times a day (TID) | ORAL | 0 refills | Status: AC
Start: 2016-12-11 — End: ?

## 2016-12-26 MED ORDER — CEPHALEXIN 500 MG OR CAPS
500.0000 mg | ORAL_CAPSULE | Freq: Four times a day (QID) | ORAL | 0 refills | Status: DC
Start: 2016-12-26 — End: 2017-01-09

## 2016-12-26 MED ORDER — OXYCODONE HCL 10 MG OR TABS
ORAL_TABLET | ORAL | 0 refills | Status: AC
Start: 2016-12-13 — End: ?

## 2016-12-26 MED ORDER — METOPROLOL TARTRATE 25 MG OR TABS
25.00 mg | ORAL_TABLET | ORAL | Status: DC
Start: ? — End: 2017-01-09

## 2016-12-26 MED ORDER — APIXABAN 5 MG PO TABS
5.0000 mg | ORAL_TABLET | Freq: Two times a day (BID) | ORAL | 3 refills | Status: AC
Start: 2016-12-26 — End: ?

## 2016-12-26 NOTE — Progress Notes (Signed)
Dalene CarrowNii-Kabu Niti Leisure MD  Surgery and Radiology, Surgery  [address] [place], [address]  [place], [place] [id number]  [email or web address]  Appointments: [phone] Fax: [phone]      Date of Evaluation: 12/26/16    Primary Care Physician: Nonie HoyerElzawahry, Heba    Attending Physician: Dalene CarrowNii-Kabu Lisel Siegrist, MD (A)    Service Provided: Outpatient Vascular/Endovascular Surgery Follow-up Evaluation    Reason for Visit: Management of LLE Gangrene    History of Present Illness:   HPI: Jerome QuarryBrandon Birnbaum is a [age]year old male with recent prolonged hospitalization 07/2016. Past history of alcoholism requiring CIWA protocol (discontinued 12/20) presenting with a traumatic left lower extremity injury 3 years ago and pain to left lower extremity concerning for claudication. He was admitted to [facility] for rest pain of LLE and had angiogram 03/22/2016, which showed tri-vessel disease with physical therapy and peroneal artery run off distally. A balloon angioplasty of his posterior tibial artery was attempted, but failed. [facility], but [facility]  Given persistent symptoms of claudication and failed balloon angioplasty, bypass surgery was performed. However, bypass was not palpable and was sent to the OR for revision of his bypass on 07/07/16.  On 07/12/16, patient developed Cdiff colitis, back to baseline after a course of antibiotics.     Patient has been seen in clinic every couple months with stable dry gangrene on the left foot. He was last seen 3 months ago when he continued to have pain and stable left foot lesions. In the interim he reports that he initially did better, but now is doing worse. He has noticed a couple new wounds on the top of his foot. He also says that he has been nearly completely bed-ridden and in pain in the past month. He denies fevers, erythema, chest pain, SOB, diarrhea.    Past Medical History:  Patient Active Problem List      Diagnosis   . Peripheral artery disease (CMS-HCC)   . Peripheral vascular disease (CMS-HCC)   . Clostridium difficile colitis   . Alcoholism /alcohol abuse (CMS-HCC)   . Alcohol withdrawal delirium, acute, hyperactive (CMS-HCC)     Past Surgical History:   Refer to HPI    Home Medications:    Current Outpatient Prescriptions:   .  amitriptyline (ELAVIL) 25 MG tablet, Take 25 mg by mouth daily.  , Disp: , Rfl: 4  .  apixaban (ELIQUIS) 5 MG TABS, Take 5 mg by mouth., Disp: , Rfl:   .  apixaban (ELIQUIS) 5 MG TABS, Take 1 tablet (5 mg) by mouth every 12 hours., Disp: 60 tablet, Rfl: 3  .  ASPIRIN 81 PO, Take 81 mg by mouth daily.  , Disp: , Rfl:   .  cephALEXin (KEFLEX) 500 MG capsule, Take 1 capsule (500 mg) by mouth 4 times daily., Disp: 20 capsule, Rfl: 0  .  cilostazol (PLETAL) 100 MG tablet, Take 1 tablet (100 mg) by mouth daily., Disp: 50 tablet, Rfl: 2  .  gabapentin (NEURONTIN) 300 MG capsule, Take 1 capsule (300 mg) by mouth 3 times daily., Disp: 100 capsule, Rfl: 0  .  gabapentin (NEURONTIN) 400 MG capsule, Take 400 mg by mouth 3 times daily., Disp: , Rfl: 1  .  gabapentin (NEURONTIN) 400 MG capsule, Take 400 mg by mouth., Disp: , Rfl:   .  gabapentin (NEURONTIN) 800 MG tablet, Take 800 mg by mouth 3 times daily., Disp: , Rfl: 0  .  HYDROcodone-acetaminophen (NORCO) 10-325 MG tablet, Take 1 tablet by mouth every 4 hours as needed for Moderate Pain (Pain Score 4-6). Earliest Fill Date: 08/27/16, Disp: 180 tablet, Rfl: 0  .  HYDROcodone-acetaminophen (NORCO) 5-325 MG tablet, Take 1 tablet by mouth every 6 hours as needed for Moderate Pain (Pain Score 4-6)., Disp: 60 tablet, Rfl: 0  .  metoprolol tartrate (LOPRESSOR) 25 MG tablet, Take 25 mg by mouth., Disp: , Rfl:   .  metoprolol tartrate (LOPRESSOR) 25 MG tablet, Take 1 tablet (25 mg) by mouth every 12 hours., Disp: 60 tablet, Rfl: 3  .  oxyCODONE (ROXICODONE) 10 MG tablet, TAKE ONE TABLET BY MOUTH FOUR TIMES DAILY AS NEEDED, Disp: , Rfl: 0  .   oxyCODONE-acetaminophen (PERCOCET) 5-325 MG tablet, Take 1 tablet by mouth every 6 hours as needed., Disp: , Rfl: 0  .  pentoxifylline (TRENTAL) 400 MG Controlled-Release tablet, Take 400 mg by mouth., Disp: , Rfl:   .  pentoxifylline (TRENTAL) 400 MG Controlled-Release tablet, Take 1 tablet (400 mg) by mouth 3 times daily (with meals)., Disp: 90 tablet, Rfl: 3  .  traMADol (ULTRAM) 50 MG tablet, Take 1 tablet (50 mg) by mouth every 4 hours as needed (Breakthrough Pain)., Disp: 30 tablet, Rfl: 0  No current facility-administered medications for this visit.     Facility-Administered Medications Ordered in Other Visits:   .  propofol (DIPRIVAN) injection (200 mg/20 mL), , IntraVENOUS, Intra-Op PRN, Ernestina Patches, MD, 200 mg at 07/12/16 1325  .  rocuronium (ZEMURON) syringe, , IntraVENOUS, Intra-Op PRN, Ernestina Patches, MD, 50 mg at 07/12/16 1325     Allergies:   Allergies   Allergen Reactions   . No Known Allergies [Other] Other     Received name: No Known Allergies        Review of Systems: A 12-point review of systems was completed, reviewed, and pertinent positives noted in the HPI.     Physical Exam:  BP (!) 151/103 (BP Location: Right arm, BP Patient Position: Sitting, BP cuff size: Large)  Pulse 109  Temp 97.7 F (36.5 C) (Oral)  Resp 14  Ht 6\' 3"  (1.905 m)  Wt 108.9 kg (240 lb)  BMI 30 kg/m2  General: Well developed, well nourished. No acute distress.  Alert, oriented to person, place, time and situation.  HEENT: Normocephalic and atraumatic.  Pupils equal, round, reactive to light and accommodation.  Extraocular movements intact.  Moist mucous membranes.   Cardiovascular: Regular rate and rhythm.  Pulmonary:  Symmetric expansion. Unlabored breathing.  Abdomen: Soft,nondistended.   NEURO: Cranial nerves II-XII intact. No gross abnormalities noted.  Extremities:  LLE: no edema, well-healed bypass scars. Dopplerable DP and PT. Dry gangrene of left 1-4th toe, worst on 1st and 4th. Possibly mild surrounding  erythema, no purulence      Functional Status: Partially Dependent     Diagnostics:  Albumin   Date Value Ref Range Status   07/14/2016 2.6 (L) 4.2 - 5.5 G/DL Final     ALT   Date Value Ref Range Status   07/14/2016 23 7 - 52 U/L Final     AST   Date Value Ref Range Status   07/14/2016 45 (H) 13 - 39 U/L Final     BUN   Date Value Ref Range Status   07/27/2016 9 7 - 25 mg/dL Final     Calcium   Date Value Ref Range Status   07/27/2016 9.3 8.6 - 10.3 mg/dL Final  Chloride   Date Value Ref Range Status   07/27/2016 102 98 - 107 mmol/L Final     Cholesterol   Date Value Ref Range Status   05/05/2016 237 (H) <200 MG/DL Final     Comment:     Desirable: <200 mg/dL  Borderline High: 161-096 mg/dL  High: >045 mg/dL       CO2   Date Value Ref Range Status   07/27/2016 27 21 - 31 mmol/L Final     Creat   Date Value Ref Range Status   07/27/2016 0.8 0.7 - 1.3 mg/dL Final     GFR Non-African American   Date Value Ref Range Status   07/27/2016 >60 >59 Final     GFR African American   Date Value Ref Range Status   07/27/2016 >60 >59 Final     Comment:     (Unit: mL/min/1.73 sq mtr)  The GFR calculation is an estimate and is NOT an accurate reflection  of GFR among patients on dialysis.       Glucose   Date Value Ref Range Status   07/27/2016 106 70 - 115 mg/dL Final     Comment:        Normal Fasting Glucose:  <100 mg/dL  Impaired Fasting Glucose: 100-125 mg/dL  Provisional DX of diabetes(must be confirmed) >125 mg/dL       Hgb   Date Value Ref Range Status   07/27/2016 9.7 (L) 13.5 - 16.9 G/DL Final     Magnesium   Date Value Ref Range Status   07/27/2016 2.0 1.9 - 2.7 mg/dL Final     Phosphorus   Date Value Ref Range Status   07/27/2016 3.6 2.5 - 5.0 MG/DL Final     PLT Count   Date Value Ref Range Status   07/27/2016 687 (H) 150 - 400 THOUS/MCL Final     Potassium   Date Value Ref Range Status   07/27/2016 3.9 3.5 - 5.1 mmol/L Final     Sodium   Date Value Ref Range Status   07/27/2016 137 136 - 145 mmol/L Final          Triglycerides   Date Value Ref Range Status   05/05/2016 139 <150 MG/DL Final     Comment:     Normal: <150 mg/dL  Borderline High: 409-811 mg/dL  High: 914-782 mg/dL  Very high: > or = 956 mg/dL         Radiology:  No relevant imaging studies for this clinic visit.     Assessment and Plan:    Remigio Mcmillon is a 41year old male with thrombosed LLE bypass x 2  Here for follow up of LLE gangrene.    Relatively stable gangrene on left toes, possibly mild cellulitis. He reports persistent pain and is completely immobile due to his foot. We had a long discussion about reconsideration of a BKA, however patient is not ready yet and would like to think about it more. We will prescribe keflex for cellulitis. We are aware of his history of C. Diff and we discussed with the patient the risks and benefits of antibiotics and he is amenable.    -5 days keflex  -cilostazol 100mg  daily (start after keflex is done)  -refilled eliquis  -instructed patient to keep wounds dry  -RTC in 2 weeks    Patient seen and discussed with Dr. Abbott Pao, MD    Agree with above note  Pt aware of  risk of c. Diff with abx regimen  Tearful today during clinic visit.    We again discussed topic of possible BKA for LLE

## 2016-12-26 NOTE — Patient Instructions (Signed)
We have e-prescribed cilostazol and keflex    We will see you back in 2 weeks for reevaluation

## 2017-01-02 ENCOUNTER — Telehealth: Payer: Self-pay | Admitting: Vascular & Interventional Radiology

## 2017-01-02 NOTE — Telephone Encounter (Signed)
Patient is regarding to speak with Pam to check if prior Berkley Harveyauth has been worked on. Please call to assist. Thank you.

## 2017-01-02 NOTE — Telephone Encounter (Signed)
Patient states he needs prior authorization for Cilostazol because the rx is for 40 years old and older but he is only [age]. Please assist and call him back when completed. thanks

## 2017-01-03 ENCOUNTER — Telehealth: Payer: Self-pay | Admitting: Vascular & Interventional Radiology

## 2017-01-03 NOTE — Telephone Encounter (Signed)
Prior auth for cilostazol requested to [employer], awaiting determination.   BIN: [id number]  PCN: [id number]  Group: [id number]

## 2017-01-03 NOTE — Telephone Encounter (Signed)
Spoke with patient and made aware pharmacy was updated as well that prior Berkley Harveyauth is in process. Patient states he has stopped the pentoxifylline as well

## 2017-01-03 NOTE — Telephone Encounter (Signed)
Patient is calling to inform you he requires a prior authorization to be submitted for the prescription that was discussed at yesterday's appointment and please note completely. Please contact his pharmacy and the patient. Thank you

## 2017-01-09 ENCOUNTER — Ambulatory Visit
Payer: No Typology Code available for payment source | Attending: Vascular & Interventional Radiology | Admitting: Vascular & Interventional Radiology

## 2017-01-09 ENCOUNTER — Encounter: Payer: Self-pay | Admitting: Vascular & Interventional Radiology

## 2017-01-09 DIAGNOSIS — I739 Peripheral vascular disease, unspecified: Principal | ICD-10-CM | POA: Insufficient documentation

## 2017-01-09 MED ORDER — CILOSTAZOL 100 MG OR TABS
100.0000 mg | ORAL_TABLET | Freq: Two times a day (BID) | ORAL | 3 refills | Status: AC
Start: 2017-01-09 — End: ?

## 2017-01-09 NOTE — Patient Instructions (Addendum)
Continue your cilostazol for 6 weeks and we will see you back for reevaluation.

## 2017-01-09 NOTE — Progress Notes (Signed)
Dalene Carrow MD  Surgery and Radiology, Surgery  [address] [place], [address]  [place], [place] [address]  [email or web address]  Appointments: [phone] Fax: [phone]      Date of Evaluation: 01/09/17    Primary Care Physician: Nonie Hoyer    Attending Physician: Dalene Carrow, MD (A)    Service Provided: Outpatient Vascular/Endovascular Surgery Follow-up Evaluation    Reason for Visit: Management of LLE Gangrene    History of Present Illness:   HPI: Jerome Hunt is a 41year old male with recent prolonged hospitalization 07/2016. Past history of alcoholism requiring CIWA protocol (discontinued 12/20) presenting with a traumatic left lower extremity injury 3 years ago and pain to left lower extremity concerning for claudication. He was admitted to [facility]) for rest pain of LLE and had angiogram 03/22/2016, which showed tri-vessel disease with physical therapy and peroneal artery run off distally. A balloon angioplasty of his posterior tibial artery was attempted, but failed. He was later readmitted at [facility] with similar symptoms, but they referred him to [facility]. Given persistent symptoms of claudication and failed balloon angioplasty, bypass surgery was performed. However, bypass was not palpable and was sent to the OR for revision of his bypass on 07/07/16. On 07/12/16, patient developed Cdiff colitis, back to baseline after a course of antibiotics.     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 Notices that new wounds are having difficulty healing. He reports improvement to his quality of life managed with medications.  Tolerating pain and doing better. He denies fevers, chest pain, SOB, diarrhea.  Plans to restart PT.    Past Medical History:  Patient Active Problem List   Diagnosis   . Peripheral artery disease (CMS-HCC)   . Peripheral vascular disease (CMS-HCC)   . Clostridium difficile colitis   . Alcoholism /alcohol abuse (CMS-HCC)   .  Alcohol withdrawal delirium, acute, hyperactive (CMS-HCC)       Past Surgical History:     Home Medications:  Outpatient Prescriptions Marked as Taking for the 01/09/17 encounter (Office Visit) with Dalene Carrow, MD   Medication Sig Dispense Refill   . amitriptyline (ELAVIL) 25 MG tablet Take 25 mg by mouth daily.    4   . apixaban (ELIQUIS) 5 MG TABS Take 1 tablet (5 mg) by mouth every 12 hours. 60 tablet 3   . cilostazol (PLETAL) 100 MG tablet Take 1 tablet (100 mg) by mouth daily. 50 tablet 2   . gabapentin (NEURONTIN) 800 MG tablet Take 800 mg by mouth 3 times daily.  0   . oxyCODONE (ROXICODONE) 10 MG tablet TAKE ONE TABLET BY MOUTH FOUR TIMES DAILY AS NEEDED  0   . traMADol (ULTRAM) 50 MG tablet Take 1 tablet (50 mg) by mouth every 4 hours as needed (Breakthrough Pain). 30 tablet 0     Allergies:   Allergies   Allergen Reactions   . No Known Allergies [Other] Other     Received name: No Known Allergies        Review of Systems: A 12-point review of systems was completed, reviewed, and pertinent positives noted in the HPI.     Physical Exam:  BP (!) 148/92 (BP Location: Left arm, BP Patient Position: Sitting, BP cuff size: Large)  Pulse 101  Temp 97.3 F (36.3 C) (Tympanic)  Resp 16  Ht 6\' 3"  (1.905 m)  Wt 101.7 kg (224 lb 4.8 oz)  BMI 28.04 kg/m2  General: Well developed, well  nourished. No acute distress.  Alert, oriented to person, place, time and situation.  HEENT: Normocephalic and atraumatic.  Pupils equal, round, reactive to light and accommodation.  Extraocular movements intact.  Moist mucous membranes. Pharynx clear.  NECK: Supple.  No carotid bruit, right and left.  Cardiovascular: Regular rate and rhythm. No murmur noted.  Pulmonary: Clear to auscultation bilaterally. Symmetric expansion. Unlabored breathing.  Abdomen: Soft, nontender, nondistended. Active bowel sounds to auscultation.  NEURO: Cranial nerves II-XII intact. No gross abnormalities noted.  Extremities:  Relatively stable  gangrene on left toes, possibly mild cellulitis. Mild unhealing wounds.    Functional Status:  Partially Dependent     Diagnostics:  Albumin   Date Value Ref Range Status   07/14/2016 2.6 (L) 4.2 - 5.5 G/DL Final     ALT   Date Value Ref Range Status   07/14/2016 23 7 - 52 U/L Final     AST   Date Value Ref Range Status   07/14/2016 45 (H) 13 - 39 U/L Final     BUN   Date Value Ref Range Status   07/27/2016 9 7 - 25 mg/dL Final     Calcium   Date Value Ref Range Status   07/27/2016 9.3 8.6 - 10.3 mg/dL Final     Chloride   Date Value Ref Range Status   07/27/2016 102 98 - 107 mmol/L Final     Cholesterol   Date Value Ref Range Status   05/05/2016 237 (H) <200 MG/DL Final     Comment:     Desirable: <200 mg/dL  Borderline High: 161-096200-239 mg/dL  High: >045>240 mg/dL       CO2   Date Value Ref Range Status   07/27/2016 27 21 - 31 mmol/L Final     Creat   Date Value Ref Range Status   07/27/2016 0.8 0.7 - 1.3 mg/dL Final     GFR Non-African American   Date Value Ref Range Status   07/27/2016 >60 >59 Final     GFR African American   Date Value Ref Range Status   07/27/2016 >60 >59 Final     Comment:     (Unit: mL/min/1.73 sq mtr)  The GFR calculation is an estimate and is NOT an accurate reflection  of GFR among patients on dialysis.       Glucose   Date Value Ref Range Status   07/27/2016 106 70 - 115 mg/dL Final     Comment:        Normal Fasting Glucose:  <100 mg/dL  Impaired Fasting Glucose: 100-125 mg/dL  Provisional DX of diabetes(must be confirmed) >125 mg/dL       Hgb   Date Value Ref Range Status   07/27/2016 9.7 (L) 13.5 - 16.9 G/DL Final     Magnesium   Date Value Ref Range Status   07/27/2016 2.0 1.9 - 2.7 mg/dL Final     Phosphorus   Date Value Ref Range Status   07/27/2016 3.6 2.5 - 5.0 MG/DL Final     PLT Count   Date Value Ref Range Status   07/27/2016 687 (H) 150 - 400 THOUS/MCL Final     Potassium   Date Value Ref Range Status   07/27/2016 3.9 3.5 - 5.1 mmol/L Final     Sodium   Date Value Ref Range Status      07/27/2016 137 136 - 145 mmol/L Final     Triglycerides   Date Value Ref Range Status  05/05/2016 139 <150 MG/DL Final     Comment:     Normal: <150 mg/dL  Borderline High: 161-096 mg/dL  High: 045-409 mg/dL  Very high: > or = 811 mg/dL         Radiology:  No relevant imaging studies for this clinic visit.     Assessment and Plan:  Jerome Hunt is a 41year old male with failed balloon angioplasty then thrombosed LLE bypass x 2,  here for follow up of LLE gangrene and poorly healing new wounds.  I again discussed the patients condition at length and offered BKA to improve quality of life.  However, patient is overall doing well and wishes to continue with current medical management. On exam, there is relatively stable gangrene on left toes, possibly mild cellulitis. Mild unhealing wounds.      -I will increase dosage of cilostazol 100 mg to BID   -Advised patient to establish care with PCP to follow up with medications  -Return to clinic in 6 weeks.  Informed patient that we will consider prolonging follow up appointments to 6 months since patient is doing well.      Scribe Attestation  The notes I am recording reflect only actions made by and judgments taken by this provider, Dr. Duanne Guess, for whom I am scribing today.  I have performed no independent clinical work.    Donna Bernard    The patient was seen and examined by Dr. Duanne Guess who agrees with above assessment and plan.    ____________________________________________________________________    Provider Attestation for Scribed Note    As the attending provider, I agree with the scribed content.  Any changes or edits are noted in the text above.    [employer]

## 2017-01-11 ENCOUNTER — Telehealth: Payer: Self-pay | Admitting: Vascular & Interventional Radiology

## 2017-01-11 NOTE — Telephone Encounter (Signed)
Pt left disability form. [facility]

## 2017-01-14 ENCOUNTER — Telehealth: Payer: Self-pay | Admitting: Vascular & Interventional Radiology

## 2017-01-14 NOTE — Telephone Encounter (Signed)
Caller states they dropped off their disability paperwork last Friday and are following up on the status and if they are ready to pick up. Per caller states it is urgent. Please call back to assist. Thanks

## 2017-01-14 NOTE — Telephone Encounter (Signed)
Spoke with patient, instructed paperwork is complete just waiting on MD signature, MD will be here on Wednesday and will sign first thing in the morning, pt to pick up later. Patient also endorsing intermittent swelling RLE no aggravating or alleviating factors, denies redness/warmth/pain, instructed we can see him in clinic on Wednesday as well if swelling is not better. Patient agreeable, no other concerns.

## 2017-01-16 ENCOUNTER — Encounter: Payer: Self-pay | Admitting: Vascular & Interventional Radiology

## 2017-01-16 ENCOUNTER — Telehealth: Payer: Self-pay

## 2017-01-16 MED ORDER — METOPROLOL TARTRATE 25 MG OR TABS
25.0000 mg | ORAL_TABLET | Freq: Two times a day (BID) | ORAL | 3 refills | Status: AC
Start: 2017-01-16 — End: ?

## 2017-01-21 ENCOUNTER — Telehealth: Payer: Self-pay | Admitting: Vascular & Interventional Radiology

## 2017-01-21 NOTE — Telephone Encounter (Signed)
[facility] is calling to obtain wound care orders and clinical notes and please note will required an approval, please include cpt codes for consult and treatment please fax [phone]. Thank you

## 2017-01-22 NOTE — Telephone Encounter (Signed)
Patients wife is calling to follow up on message below. Requesting to speak with pam. Please assist. Thank you

## 2017-01-24 NOTE — Telephone Encounter (Signed)
Spoke with MD- no new wound care orders, MD instructed to leave wound alone open to air, LM with patient with these instructions and [facility] [phone]) as well. No other concerns.

## 2017-02-20 ENCOUNTER — Encounter: Payer: Self-pay | Admitting: Vascular & Interventional Radiology

## 2017-02-20 ENCOUNTER — Ambulatory Visit
Payer: No Typology Code available for payment source | Attending: Vascular & Interventional Radiology | Admitting: Vascular & Interventional Radiology

## 2017-02-20 ENCOUNTER — Ambulatory Visit: Payer: Self-pay | Admitting: Vascular & Interventional Radiology

## 2017-02-20 DIAGNOSIS — I739 Peripheral vascular disease, unspecified: Principal | ICD-10-CM | POA: Insufficient documentation

## 2017-02-20 MED ORDER — PENTOXIFYLLINE CR 400 MG OR TBCR
EXTENDED_RELEASE_TABLET | ORAL | 3 refills | Status: AC
Start: 2017-02-05 — End: ?

## 2017-02-20 MED ORDER — DOXYCYCLINE HYCLATE 100 MG OR CAPS
ORAL_CAPSULE | ORAL | 0 refills | Status: AC
Start: 2017-02-08 — End: ?

## 2017-02-20 NOTE — Progress Notes (Signed)
Dalene Carrow MD  Surgery and Radiology, Surgery  [address] [place], [address]  [place], [place] [address]  [email or web address]  Appointments: [phone] Fax: [phone]      Date of Evaluation: 02/20/17    Primary Care Physician: Nonie Hoyer    Attending Physician: Dalene Carrow, MD (A)    Service Provided: Outpatient Vascular/Endovascular Surgery Follow-up Evaluation    Reason for Visit: Management of PVD     History of Present Illness:   HPI: Jerome Hunt is a 41year old male with recent prolonged hospitalization 07/2016. Past history of alcoholism requiring CIWA protocol (discontinued 12/20) presenting with a traumatic left lower extremity injury 3 years ago and pain to left lower extremity concerning for claudication. He was admitted to [facility]) for rest pain of LLE and had angiogram 03/22/2016, which showed tri-vessel disease with physical therapy and peroneal artery run off distally. A balloon angioplasty of his posterior tibial artery was attempted, but failed. He was later readmitted at [facility] with similar symptoms, but they referred him to [facility]. Given persistent symptoms of claudication and failed balloon angioplasty, bypass surgery was performed. However, bypass was not palpable and was sent to the OR for revision of his bypass on 07/07/16. On 07/12/16, patient developed Cdiff colitis, back to baseline after a course of antibiotics.     Patient doing well overall.  Working with wound care to address gangrene on left toe and was told that it will likely peel off on its own without debridement surgery.  Once skin peels, may consider hyperbaric chamber.  Pain tolerated with tylenol and tramadol.  Has noticed improvement with increased dosage of cilostazol and has given a try of Butcher's [employer]room.  Denies fevers and chills.    Past Medical History:  Patient Active Problem List   Diagnosis   . Peripheral artery disease (CMS-HCC)   . Peripheral vascular disease (CMS-HCC)      . Clostridium difficile colitis   . Alcoholism /alcohol abuse (CMS-HCC)   . Alcohol withdrawal delirium, acute, hyperactive (CMS-HCC)       Past Surgical History:     Home Medications:    Current Outpatient Prescriptions:   .  amitriptyline (ELAVIL) 25 MG tablet, Take 25 mg by mouth daily.  , Disp: , Rfl: 4  .  apixaban (ELIQUIS) 5 MG TABS, Take 1 tablet (5 mg) by mouth every 12 hours., Disp: 60 tablet, Rfl: 3  .  cilostazol (PLETAL) 100 MG tablet, Take 1 tablet (100 mg) by mouth 2 times daily., Disp: 90 tablet, Rfl: 3  .  doxyCYCLINE (VIBRAMYCIN) 100 MG capsule, TAKE ONE CAPSULE BY MOUTH THREE TIMES DAILY FOR THREE WEEKS, Disp: , Rfl: 0  .  gabapentin (NEURONTIN) 800 MG tablet, Take 800 mg by mouth 3 times daily., Disp: , Rfl: 0  .  metoprolol tartrate (LOPRESSOR) 25 MG tablet, Take 1 tablet (25 mg) by mouth every 12 hours., Disp: 60 tablet, Rfl: 3  .  oxyCODONE (ROXICODONE) 10 MG tablet, TAKE ONE TABLET BY MOUTH FOUR TIMES DAILY AS NEEDED, Disp: , Rfl: 0  .  pentoxifylline (TRENTAL) 400 MG Controlled-Release tablet, TAKE ONE TABLET BY MOUTH THREE TIMES DAILY WITH FOOD, Disp: , Rfl: 3  .  traMADol (ULTRAM) 50 MG tablet, Take 1 tablet (50 mg) by mouth every 4 hours as needed (Breakthrough Pain)., Disp: 30 tablet, Rfl: 0    Social History:  Social History     Social History   . Marital status: Married  Spouse name: N/A   . Number of children: N/A   . Years of education: N/A     Occupational History   . Not on file.     Social History Main Topics   . Smoking status: Former Games developer   . Smokeless tobacco: Former Neurosurgeon   . Alcohol use Not on file   . Drug use: Not on file   . Sexual activity: Not on file     Other Topics Concern   . Not on file     Social History Narrative       Allergies:   Allergies   Allergen Reactions   . No Known Allergies [Other] Other     Received name: No Known Allergies        Review of Systems: A 12-point review of systems was completed, reviewed, and pertinent positives noted in the HPI.      Physical Exam:  BP 159/87 (BP Location: Left arm, BP Patient Position: Sitting, BP cuff size: Regular)  Pulse 102  Temp 98.3 F (36.8 C) (Tympanic)  Resp 14  Ht 6\' 3"  (1.905 m)  Wt 108.8 kg (239 lb 12.8 oz)  BMI 29.97 kg/m2  General: Well developed, well nourished. No acute distress.  Alert, oriented to person, place, time and situation.  HEENT: Normocephalic and atraumatic.  Pupils equal, round, reactive to light and accommodation.  Extraocular movements intact.  Moist mucous membranes. Pharynx clear.  NECK: Supple.  No carotid bruit, right and left.  Cardiovascular: Regular rate and rhythm. No murmur noted.  Pulmonary: Clear to auscultation bilaterally. Symmetric expansion. Unlabored breathing.  Abdomen: Soft, nontender, nondistended. Active bowel sounds to auscultation.  NEURO: Cranial nerves II-XII intact. No gross abnormalities noted.  Extremities: Gangrene tissue of first L toe. Granulation tissue noticed below. No signs of infection.     Functional Status:  Independent     Diagnostics:  Albumin   Date Value Ref Range Status   07/14/2016 2.6 (L) 4.2 - 5.5 G/DL Final     ALT   Date Value Ref Range Status   07/14/2016 23 7 - 52 U/L Final     AST   Date Value Ref Range Status   07/14/2016 45 (H) 13 - 39 U/L Final     BUN   Date Value Ref Range Status   07/27/2016 9 7 - 25 mg/dL Final     Calcium   Date Value Ref Range Status   07/27/2016 9.3 8.6 - 10.3 mg/dL Final     Chloride   Date Value Ref Range Status   07/27/2016 102 98 - 107 mmol/L Final     Cholesterol   Date Value Ref Range Status   05/05/2016 237 (H) <200 MG/DL Final     Comment:     Desirable: <200 mg/dL  Borderline High: 811-914 mg/dL  High: >782 mg/dL       CO2   Date Value Ref Range Status   07/27/2016 27 21 - 31 mmol/L Final     Creat   Date Value Ref Range Status   07/27/2016 0.8 0.7 - 1.3 mg/dL Final     GFR Non-African American   Date Value Ref Range Status   07/27/2016 >60 >59 Final     GFR African American   Date Value Ref Range  Status   07/27/2016 >60 >59 Final     Comment:     (Unit: mL/min/1.73 sq mtr)  The GFR calculation is an estimate and is NOT an accurate  reflection  of GFR among patients on dialysis.       Glucose   Date Value Ref Range Status   07/27/2016 106 70 - 115 mg/dL Final     Comment:        Normal Fasting Glucose:  <100 mg/dL  Impaired Fasting Glucose: 100-125 mg/dL  Provisional DX of diabetes(must be confirmed) >125 mg/dL       Hgb   Date Value Ref Range Status   07/27/2016 9.7 (L) 13.5 - 16.9 G/DL Final     Magnesium   Date Value Ref Range Status   07/27/2016 2.0 1.9 - 2.7 mg/dL Final     Phosphorus   Date Value Ref Range Status   07/27/2016 3.6 2.5 - 5.0 MG/DL Final     PLT Count   Date Value Ref Range Status   07/27/2016 687 (H) 150 - 400 THOUS/MCL Final     Potassium   Date Value Ref Range Status   07/27/2016 3.9 3.5 - 5.1 mmol/L Final     Sodium   Date Value Ref Range Status   07/27/2016 137 136 - 145 mmol/L Final     Triglycerides   Date Value Ref Range Status   05/05/2016 139 <150 MG/DL Final     Comment:     Normal: <150 mg/dL  Borderline High: 161-096150-199 mg/dL  High: 045-409200-499 mg/dL  Very high: > or = 811500 mg/dL         Radiology:  No relevant imaging studies for this clinic visit.    Assessment and Plan:  Jerome Hunt is a [age]year old male with failed balloon angioplasty then thrombosed LLE bypass x 2, here for followup of LLE gangrene and poorly healing new wounds.  Patient doing well and tolerating pain.  Patient understands that he is a poor surgical candidate and may need to consider BKA to improve quality of life in the future.  Gangrenous left toe, likely to peel off on exam.  Granulation tissue noticed below.      -Continue taking cilostazol 100 mg to BID   -Return to clinic as needed. Informed patient to schedule appointment if ulcer does not heal or pain becomes intolerable      Production assistant, radiocribe Attestation  The notes I am recording reflect only actions made by and judgments taken by this provider, Dr. Duanne GuessKabutey, for  whom I am scribing today.  I have performed no independent clinical work.    Donna BernardKen Siangchin    The patient was seen and examined by Dr. Duanne GuessKabutey who agrees with above assessment and plan.    ____________________________________________________________________    Provider Attestation for Scribed Note    As the attending provider, I agree with the scribed content.  Any changes or edits are noted in the text above.    [employer]

## 2017-03-13 NOTE — Telephone Encounter (Signed)
A user error has taken place: encounter opened in error, closed for administrative reasons.

## 2017-04-03 ENCOUNTER — Ambulatory Visit: Payer: Self-pay | Admitting: Vascular & Interventional Radiology
